# Patient Record
Sex: Male | Born: 1955 | Race: White | Hispanic: No | Marital: Single | Smoking: Former smoker
Health system: Southern US, Community
[De-identification: ages and names within clinical notes are randomized; demographics above are authoritative.]

## PROBLEM LIST (undated history)

## (undated) DIAGNOSIS — J309 Allergic rhinitis, unspecified: Secondary | ICD-10-CM

## (undated) DIAGNOSIS — K648 Other hemorrhoids: Secondary | ICD-10-CM

## (undated) DIAGNOSIS — G4730 Sleep apnea, unspecified: Secondary | ICD-10-CM

## (undated) DIAGNOSIS — L659 Nonscarring hair loss, unspecified: Secondary | ICD-10-CM

## (undated) DIAGNOSIS — F3342 Major depressive disorder, recurrent, in full remission: Secondary | ICD-10-CM

## (undated) DIAGNOSIS — I10 Essential (primary) hypertension: Secondary | ICD-10-CM

## (undated) DIAGNOSIS — E785 Hyperlipidemia, unspecified: Secondary | ICD-10-CM

## (undated) DIAGNOSIS — D126 Benign neoplasm of colon, unspecified: Secondary | ICD-10-CM

## (undated) DIAGNOSIS — IMO0002 Reserved for concepts with insufficient information to code with codable children: Secondary | ICD-10-CM

## (undated) DIAGNOSIS — T7840XA Allergy, unspecified, initial encounter: Secondary | ICD-10-CM

## (undated) DIAGNOSIS — K5190 Ulcerative colitis, unspecified, without complications: Secondary | ICD-10-CM

## (undated) DIAGNOSIS — E291 Testicular hypofunction: Secondary | ICD-10-CM

## (undated) HISTORY — DX: Sleep apnea, unspecified: G47.30

## (undated) HISTORY — DX: Benign neoplasm of colon, unspecified: D12.6

## (undated) HISTORY — PX: NASAL SINUS SURGERY: SHX719

## (undated) HISTORY — DX: Hyperlipidemia, unspecified: E78.5

## (undated) HISTORY — DX: Essential (primary) hypertension: I10

## (undated) HISTORY — DX: Allergy, unspecified, initial encounter: T78.40XA

## (undated) HISTORY — PX: POLYPECTOMY: SHX149

## (undated) HISTORY — PX: COLONOSCOPY: SHX174

## (undated) HISTORY — PX: WISDOM TOOTH EXTRACTION: SHX21

## (undated) HISTORY — DX: Major depressive disorder, recurrent, in full remission: F33.42

## (undated) HISTORY — DX: Other hemorrhoids: K64.8

## (undated) HISTORY — DX: Testicular hypofunction: E29.1

## (undated) HISTORY — DX: Allergic rhinitis, unspecified: J30.9

## (undated) HISTORY — DX: Nonscarring hair loss, unspecified: L65.9

## (undated) HISTORY — DX: Reserved for concepts with insufficient information to code with codable children: IMO0002

## (undated) HISTORY — DX: Ulcerative colitis, unspecified, without complications: K51.90

---

## 1988-03-02 ENCOUNTER — Encounter: Payer: Self-pay | Admitting: Gastroenterology

## 1990-10-22 ENCOUNTER — Encounter: Payer: Self-pay | Admitting: Internal Medicine

## 1993-06-08 ENCOUNTER — Encounter: Payer: Self-pay | Admitting: Gastroenterology

## 1995-09-05 DIAGNOSIS — K5190 Ulcerative colitis, unspecified, without complications: Secondary | ICD-10-CM

## 1995-09-05 HISTORY — DX: Ulcerative colitis, unspecified, without complications: K51.90

## 1996-01-09 ENCOUNTER — Encounter: Payer: Self-pay | Admitting: Gastroenterology

## 2000-06-21 ENCOUNTER — Encounter (INDEPENDENT_AMBULATORY_CARE_PROVIDER_SITE_OTHER): Payer: Self-pay | Admitting: Specialist

## 2000-06-21 ENCOUNTER — Encounter: Payer: Self-pay | Admitting: Gastroenterology

## 2000-06-21 ENCOUNTER — Other Ambulatory Visit: Admission: RE | Admit: 2000-06-21 | Discharge: 2000-06-21 | Payer: Self-pay | Admitting: Gastroenterology

## 2002-06-20 ENCOUNTER — Encounter: Payer: Self-pay | Admitting: Gastroenterology

## 2002-06-20 LAB — HM COLONOSCOPY

## 2004-07-07 ENCOUNTER — Ambulatory Visit: Payer: Self-pay | Admitting: Internal Medicine

## 2004-07-18 ENCOUNTER — Ambulatory Visit: Payer: Self-pay | Admitting: Gastroenterology

## 2004-07-18 LAB — HM COLONOSCOPY

## 2004-08-17 ENCOUNTER — Ambulatory Visit: Payer: Self-pay | Admitting: Internal Medicine

## 2004-09-22 ENCOUNTER — Ambulatory Visit: Payer: Self-pay | Admitting: Internal Medicine

## 2004-09-29 ENCOUNTER — Ambulatory Visit: Payer: Self-pay | Admitting: Internal Medicine

## 2004-11-22 ENCOUNTER — Ambulatory Visit: Payer: Self-pay | Admitting: Internal Medicine

## 2004-11-29 ENCOUNTER — Ambulatory Visit: Payer: Self-pay | Admitting: Internal Medicine

## 2004-12-08 ENCOUNTER — Ambulatory Visit: Payer: Self-pay | Admitting: Internal Medicine

## 2005-03-03 ENCOUNTER — Ambulatory Visit: Payer: Self-pay | Admitting: Internal Medicine

## 2005-04-07 ENCOUNTER — Ambulatory Visit: Payer: Self-pay | Admitting: Internal Medicine

## 2005-06-08 ENCOUNTER — Ambulatory Visit: Payer: Self-pay | Admitting: Internal Medicine

## 2005-08-08 ENCOUNTER — Ambulatory Visit: Payer: Self-pay | Admitting: Internal Medicine

## 2005-08-15 ENCOUNTER — Ambulatory Visit: Payer: Self-pay | Admitting: Internal Medicine

## 2005-09-26 ENCOUNTER — Ambulatory Visit: Payer: Self-pay | Admitting: Gastroenterology

## 2005-10-12 ENCOUNTER — Ambulatory Visit: Payer: Self-pay | Admitting: Internal Medicine

## 2005-10-26 ENCOUNTER — Ambulatory Visit: Payer: Self-pay | Admitting: Internal Medicine

## 2006-02-13 ENCOUNTER — Ambulatory Visit: Payer: Self-pay | Admitting: Internal Medicine

## 2006-08-14 ENCOUNTER — Ambulatory Visit: Payer: Self-pay | Admitting: Internal Medicine

## 2006-08-14 LAB — CONVERTED CEMR LAB
Albumin: 4.3 g/dL (ref 3.5–5.2)
BUN: 12 mg/dL (ref 6–23)
Basophils Relative: 0.4 % (ref 0.0–1.0)
Chloride: 105 meq/L (ref 96–112)
Creatinine, Ser: 0.9 mg/dL (ref 0.4–1.5)
Eosinophil percent: 3.3 % (ref 0.0–5.0)
Glucose, Bld: 102 mg/dL — ABNORMAL HIGH (ref 70–99)
Hemoglobin: 14.6 g/dL (ref 13.0–17.0)
Lymphocytes Relative: 25.1 % (ref 12.0–46.0)
MCV: 93 fL (ref 78.0–100.0)
Monocytes Absolute: 0.6 10*3/uL (ref 0.2–0.7)
Monocytes Relative: 9.1 % (ref 3.0–11.0)
Neutro Abs: 4 10*3/uL (ref 1.4–7.7)
Neutrophils Relative %: 62.1 % (ref 43.0–77.0)
Platelets: 291 10*3/uL (ref 150–400)
Potassium: 4.4 meq/L (ref 3.5–5.1)
RBC: 4.71 M/uL (ref 4.22–5.81)
Total Protein: 7.2 g/dL (ref 6.0–8.3)
Triglyceride fasting, serum: 112 mg/dL (ref 0–149)

## 2006-08-21 ENCOUNTER — Ambulatory Visit: Payer: Self-pay | Admitting: Internal Medicine

## 2006-09-06 ENCOUNTER — Ambulatory Visit: Payer: Self-pay

## 2006-09-18 ENCOUNTER — Ambulatory Visit: Payer: Self-pay | Admitting: Gastroenterology

## 2006-10-02 ENCOUNTER — Encounter (INDEPENDENT_AMBULATORY_CARE_PROVIDER_SITE_OTHER): Payer: Self-pay | Admitting: *Deleted

## 2006-10-02 ENCOUNTER — Ambulatory Visit: Payer: Self-pay | Admitting: Gastroenterology

## 2006-11-02 ENCOUNTER — Ambulatory Visit: Payer: Self-pay | Admitting: Internal Medicine

## 2006-11-02 LAB — CONVERTED CEMR LAB: HIV-1 antibody: UNDETERMINED — AB

## 2006-11-13 ENCOUNTER — Ambulatory Visit: Payer: Self-pay | Admitting: Internal Medicine

## 2007-02-19 ENCOUNTER — Ambulatory Visit: Payer: Self-pay | Admitting: Internal Medicine

## 2007-02-19 LAB — CONVERTED CEMR LAB
BUN: 16 mg/dL (ref 6–23)
Calcium: 9.6 mg/dL (ref 8.4–10.5)
GFR calc Af Amer: 115 mL/min
GFR calc non Af Amer: 95 mL/min
Sodium: 140 meq/L (ref 135–145)

## 2007-02-20 ENCOUNTER — Encounter: Payer: Self-pay | Admitting: Internal Medicine

## 2007-02-20 LAB — CONVERTED CEMR LAB
HIV 1 RNA Quant: <50 copies/mL (ref <50)
HIV-2 Ab: NEGATIVE
HIV: REACTIVE

## 2007-05-28 ENCOUNTER — Ambulatory Visit: Payer: Self-pay | Admitting: Internal Medicine

## 2007-05-28 DIAGNOSIS — K5190 Ulcerative colitis, unspecified, without complications: Secondary | ICD-10-CM

## 2007-05-28 DIAGNOSIS — G4700 Insomnia, unspecified: Secondary | ICD-10-CM

## 2007-05-28 DIAGNOSIS — I10 Essential (primary) hypertension: Secondary | ICD-10-CM

## 2007-05-28 DIAGNOSIS — E785 Hyperlipidemia, unspecified: Secondary | ICD-10-CM

## 2007-05-28 DIAGNOSIS — L659 Nonscarring hair loss, unspecified: Secondary | ICD-10-CM

## 2007-05-28 HISTORY — DX: Essential (primary) hypertension: I10

## 2007-05-28 HISTORY — DX: Ulcerative colitis, unspecified, without complications: K51.90

## 2007-05-28 HISTORY — DX: Insomnia, unspecified: G47.00

## 2007-05-28 HISTORY — DX: Nonscarring hair loss, unspecified: L65.9

## 2007-05-28 HISTORY — DX: Hyperlipidemia, unspecified: E78.5

## 2007-05-28 LAB — CONVERTED CEMR LAB
Cholesterol, target level: 200 mg/dL
LDL Goal: 130 mg/dL

## 2007-09-23 ENCOUNTER — Ambulatory Visit: Payer: Self-pay | Admitting: Internal Medicine

## 2007-12-23 ENCOUNTER — Encounter: Payer: Self-pay | Admitting: Internal Medicine

## 2008-03-24 ENCOUNTER — Ambulatory Visit: Payer: Self-pay | Admitting: Internal Medicine

## 2008-03-24 LAB — CONVERTED CEMR LAB
AST: 22 units/L (ref 0–37)
LDL Cholesterol: 106 mg/dL — ABNORMAL HIGH (ref 0–99)
Total CHOL/HDL Ratio: 4.5
Triglycerides: 169 mg/dL — ABNORMAL HIGH (ref 0–149)
VLDL: 34 mg/dL (ref 0–40)

## 2008-03-31 ENCOUNTER — Ambulatory Visit: Payer: Self-pay | Admitting: Internal Medicine

## 2008-07-17 ENCOUNTER — Ambulatory Visit: Payer: Self-pay | Admitting: Internal Medicine

## 2008-07-17 LAB — CONVERTED CEMR LAB: Testosterone: 217.02 ng/dL — ABNORMAL LOW (ref 350.00–890)

## 2008-07-24 ENCOUNTER — Ambulatory Visit: Payer: Self-pay | Admitting: Internal Medicine

## 2008-07-24 DIAGNOSIS — E291 Testicular hypofunction: Secondary | ICD-10-CM

## 2008-07-24 HISTORY — DX: Testicular hypofunction: E29.1

## 2008-08-03 ENCOUNTER — Telehealth: Payer: Self-pay | Admitting: Gastroenterology

## 2008-08-04 ENCOUNTER — Ambulatory Visit: Payer: Self-pay | Admitting: Gastroenterology

## 2008-08-11 ENCOUNTER — Ambulatory Visit: Payer: Self-pay | Admitting: Internal Medicine

## 2008-08-17 ENCOUNTER — Encounter: Payer: Self-pay | Admitting: Gastroenterology

## 2008-08-17 ENCOUNTER — Ambulatory Visit: Payer: Self-pay | Admitting: Gastroenterology

## 2008-08-19 ENCOUNTER — Encounter: Payer: Self-pay | Admitting: Gastroenterology

## 2008-09-14 ENCOUNTER — Ambulatory Visit: Payer: Self-pay | Admitting: Internal Medicine

## 2008-10-14 ENCOUNTER — Ambulatory Visit: Payer: Self-pay | Admitting: Internal Medicine

## 2008-10-16 ENCOUNTER — Ambulatory Visit: Payer: Self-pay | Admitting: Internal Medicine

## 2008-10-16 LAB — CONVERTED CEMR LAB: Testosterone: 409.19 ng/dL (ref 350.00–890)

## 2008-10-23 ENCOUNTER — Ambulatory Visit: Payer: Self-pay | Admitting: Internal Medicine

## 2008-11-11 ENCOUNTER — Ambulatory Visit: Payer: Self-pay | Admitting: Internal Medicine

## 2008-11-17 ENCOUNTER — Ambulatory Visit: Payer: Self-pay | Admitting: Internal Medicine

## 2008-11-17 ENCOUNTER — Telehealth: Payer: Self-pay | Admitting: *Deleted

## 2008-11-17 LAB — CONVERTED CEMR LAB: Creatinine, Ser: 0.8 mg/dL (ref 0.4–1.5)

## 2008-11-18 ENCOUNTER — Encounter: Admission: RE | Admit: 2008-11-18 | Discharge: 2008-11-18 | Payer: Self-pay | Admitting: Internal Medicine

## 2008-11-25 ENCOUNTER — Encounter: Payer: Self-pay | Admitting: Internal Medicine

## 2008-11-25 ENCOUNTER — Ambulatory Visit: Payer: Self-pay

## 2008-12-14 ENCOUNTER — Ambulatory Visit: Payer: Self-pay | Admitting: Internal Medicine

## 2008-12-18 ENCOUNTER — Telehealth: Payer: Self-pay | Admitting: Internal Medicine

## 2008-12-18 ENCOUNTER — Encounter: Payer: Self-pay | Admitting: Internal Medicine

## 2008-12-18 DIAGNOSIS — G4730 Sleep apnea, unspecified: Secondary | ICD-10-CM | POA: Insufficient documentation

## 2008-12-18 HISTORY — DX: Sleep apnea, unspecified: G47.30

## 2009-01-18 ENCOUNTER — Ambulatory Visit: Payer: Self-pay | Admitting: Internal Medicine

## 2009-01-29 ENCOUNTER — Ambulatory Visit (HOSPITAL_BASED_OUTPATIENT_CLINIC_OR_DEPARTMENT_OTHER): Admission: RE | Admit: 2009-01-29 | Discharge: 2009-01-29 | Payer: Self-pay | Admitting: Neurosurgery

## 2009-01-29 ENCOUNTER — Encounter: Payer: Self-pay | Admitting: Internal Medicine

## 2009-02-09 ENCOUNTER — Ambulatory Visit: Payer: Self-pay | Admitting: Pulmonary Disease

## 2009-02-19 ENCOUNTER — Ambulatory Visit: Payer: Self-pay | Admitting: Internal Medicine

## 2009-03-26 ENCOUNTER — Ambulatory Visit: Payer: Self-pay | Admitting: Internal Medicine

## 2009-05-05 ENCOUNTER — Ambulatory Visit: Payer: Self-pay | Admitting: Internal Medicine

## 2009-05-07 ENCOUNTER — Ambulatory Visit: Payer: Self-pay | Admitting: Internal Medicine

## 2009-05-07 LAB — CONVERTED CEMR LAB
ALT: 27 units/L (ref 0–53)
AST: 27 units/L (ref 0–37)
Basophils Relative: 0.8 % (ref 0.0–3.0)
Bilirubin Urine: NEGATIVE
Chloride: 106 meq/L (ref 96–112)
Cholesterol: 167 mg/dL (ref 0–200)
Eosinophils Relative: 2.9 % (ref 0.0–5.0)
GFR calc non Af Amer: 108.17 mL/min (ref >60)
HCT: 44 % (ref 39.0–52.0)
Hemoglobin: 15.3 g/dL (ref 13.0–17.0)
LDL Cholesterol: 98 mg/dL (ref 0–99)
Lymphs Abs: 1.4 10*3/uL (ref 0.7–4.0)
MCV: 91.4 fL (ref 78.0–100.0)
Monocytes Absolute: 0.6 10*3/uL (ref 0.1–1.0)
Monocytes Relative: 9.5 % (ref 3.0–12.0)
Neutro Abs: 4.2 10*3/uL (ref 1.4–7.7)
Platelets: 220 10*3/uL (ref 150.0–400.0)
Potassium: 4.1 meq/L (ref 3.5–5.1)
Protein, U semiquant: NEGATIVE
RBC: 4.81 M/uL (ref 4.22–5.81)
Sodium: 140 meq/L (ref 135–145)
TSH: 1.14 microintl units/mL (ref 0.35–5.50)
Total Bilirubin: 0.8 mg/dL (ref 0.3–1.2)
Total CHOL/HDL Ratio: 4
Total Protein: 7.1 g/dL (ref 6.0–8.3)
Urobilinogen, UA: 0.2
VLDL: 24.8 mg/dL (ref 0.0–40.0)
WBC Urine, dipstick: NEGATIVE
WBC: 6.5 10*3/uL (ref 4.5–10.5)

## 2009-05-21 ENCOUNTER — Ambulatory Visit: Payer: Self-pay | Admitting: Internal Medicine

## 2009-05-21 DIAGNOSIS — J309 Allergic rhinitis, unspecified: Secondary | ICD-10-CM | POA: Insufficient documentation

## 2009-05-21 HISTORY — DX: Allergic rhinitis, unspecified: J30.9

## 2009-06-10 ENCOUNTER — Ambulatory Visit: Payer: Self-pay | Admitting: Internal Medicine

## 2009-07-21 ENCOUNTER — Ambulatory Visit: Payer: Self-pay | Admitting: Internal Medicine

## 2009-08-09 ENCOUNTER — Ambulatory Visit: Payer: Self-pay | Admitting: Internal Medicine

## 2009-09-10 ENCOUNTER — Ambulatory Visit: Payer: Self-pay | Admitting: Internal Medicine

## 2009-10-13 ENCOUNTER — Ambulatory Visit: Payer: Self-pay | Admitting: Internal Medicine

## 2009-11-11 ENCOUNTER — Ambulatory Visit: Payer: Self-pay | Admitting: Internal Medicine

## 2009-11-11 LAB — CONVERTED CEMR LAB
ALT: 25 units/L (ref 0–53)
Alkaline Phosphatase: 48 units/L (ref 39–117)
Bilirubin, Direct: 0 mg/dL (ref 0.0–0.3)
Direct LDL: 142.2 mg/dL
HDL: 47.7 mg/dL (ref >39.00)
Total Bilirubin: 0.6 mg/dL (ref 0.3–1.2)

## 2009-11-18 ENCOUNTER — Ambulatory Visit: Payer: Self-pay | Admitting: Internal Medicine

## 2009-12-07 ENCOUNTER — Telehealth: Payer: Self-pay | Admitting: Gastroenterology

## 2010-01-11 DIAGNOSIS — K648 Other hemorrhoids: Secondary | ICD-10-CM

## 2010-01-11 HISTORY — DX: Other hemorrhoids: K64.8

## 2010-01-12 ENCOUNTER — Ambulatory Visit: Payer: Self-pay | Admitting: Gastroenterology

## 2010-01-26 ENCOUNTER — Ambulatory Visit: Payer: Self-pay | Admitting: Internal Medicine

## 2010-02-10 ENCOUNTER — Ambulatory Visit: Payer: Self-pay | Admitting: Internal Medicine

## 2010-02-14 ENCOUNTER — Telehealth: Payer: Self-pay | Admitting: Internal Medicine

## 2010-03-01 ENCOUNTER — Ambulatory Visit: Payer: Self-pay | Admitting: Internal Medicine

## 2010-03-09 ENCOUNTER — Telehealth: Payer: Self-pay | Admitting: Internal Medicine

## 2010-03-18 ENCOUNTER — Ambulatory Visit: Payer: Self-pay | Admitting: Internal Medicine

## 2010-03-18 DIAGNOSIS — IMO0002 Reserved for concepts with insufficient information to code with codable children: Secondary | ICD-10-CM

## 2010-03-18 HISTORY — DX: Reserved for concepts with insufficient information to code with codable children: IMO0002

## 2010-03-23 ENCOUNTER — Ambulatory Visit: Payer: Self-pay | Admitting: Internal Medicine

## 2010-03-23 LAB — CONVERTED CEMR LAB
ALT: 24 units/L (ref 0–53)
Cholesterol: 155 mg/dL (ref 0–200)
HDL: 38 mg/dL — ABNORMAL LOW (ref >39.00)
LDL Cholesterol: 86 mg/dL (ref 0–99)
Testosterone: 258.7 ng/dL — ABNORMAL LOW (ref 350.00–890.00)
Total Bilirubin: 0.5 mg/dL (ref 0.3–1.2)
Total Protein: 6.7 g/dL (ref 6.0–8.3)
VLDL: 30.8 mg/dL (ref 0.0–40.0)

## 2010-03-30 ENCOUNTER — Ambulatory Visit: Payer: Self-pay | Admitting: Internal Medicine

## 2010-04-20 ENCOUNTER — Ambulatory Visit: Payer: Self-pay | Admitting: Internal Medicine

## 2010-05-18 ENCOUNTER — Ambulatory Visit: Payer: Self-pay | Admitting: Internal Medicine

## 2010-06-08 ENCOUNTER — Ambulatory Visit: Payer: Self-pay | Admitting: Internal Medicine

## 2010-07-01 ENCOUNTER — Ambulatory Visit: Payer: Self-pay | Admitting: Internal Medicine

## 2010-07-20 ENCOUNTER — Ambulatory Visit: Payer: Self-pay | Admitting: Internal Medicine

## 2010-08-15 ENCOUNTER — Encounter: Payer: Self-pay | Admitting: Gastroenterology

## 2010-08-25 ENCOUNTER — Ambulatory Visit: Payer: Self-pay | Admitting: Internal Medicine

## 2010-09-14 ENCOUNTER — Ambulatory Visit
Admission: RE | Admit: 2010-09-14 | Discharge: 2010-09-14 | Payer: Self-pay | Source: Home / Self Care | Attending: Internal Medicine | Admitting: Internal Medicine

## 2010-09-25 ENCOUNTER — Encounter: Payer: Self-pay | Admitting: Internal Medicine

## 2010-10-06 NOTE — Assessment & Plan Note (Signed)
Summary: testosterone inj/cjr  Nurse Visit   Allergies: No Known Drug Allergies  Medication Administration  Injection # 1:    Medication: Testosterone Cypionat 260m ing    Diagnosis: HYPOGONADISM, MALE (ICD-257.2)    Route: IM    Site: LUOQ gluteus    Exp Date: 02/02/2011    Lot #: [id number]   Mfr: [email or web address]    Given by: BAllyne Gee LPN (September 14, 2722512:19 PM)  Orders Added: 1)  Admin of patients own med IM/SQ [96372M] 2)  Admin 1st Vaccine [90471] 3)  Flu Vaccine 332yr+ [9[75051]Review of Systems       Flu Vaccine Consent Questions     Do you have a history of severe allergic reactions to this vaccine? no    Any prior history of allergic reactions to egg and/or gelatin? no    Do you have a sensitivity to the preservative Thimersol? no    Do you have a past history of Guillan-Barre Syndrome? no    Do you currently have an acute febrile illness? no    Have you ever had a severe reaction to latex? no    Vaccine information given and explained to patient? yes    Are you currently pregnant? no    Lot Number:[id number]   Exp Date:03/04/2011   Site Given  Left Deltoid IM

## 2010-10-06 NOTE — Assessment & Plan Note (Signed)
Summary: 4 month rov/njr   Vital Signs:  Patient profile:   55 year old male Height:      73 inches Weight:      301 pounds BMI:     39.86 Temp:     98.2 degrees F oral Pulse rate:   96 / minute Resp:     14 per minute BP sitting:   136 / 82  (left arm) Cuff size:   large  Vitals Entered By: Allyne Gee, LPN (March 30, 1609 9:60 AM) CC: roa labs, Lipid Management Is Patient Diabetic? No   Primary Care Provider:  Benay Pillow, MD   CC:  roa labs and Lipid Management.  History of Present Illness: follow up for blood work asthma is fine back pain  is still present if he takes the IBU it is controllable but if he misses a does he can feel the pain radiating into the foot and ankle the plain films were not diagnostic ( murphy wainer)  Lipid Management History:      Positive NCEP/ATP III risk factors include male age 47 years old or older, HDL cholesterol less than 29, family history for ischemic heart disease (males less than 28 years old), and hypertension.  Negative NCEP/ATP III risk factors include non-diabetic, non-tobacco-user status, no ASHD (atherosclerotic heart disease), no prior stroke/TIA, no peripheral vascular disease, and no history of aortic aneurysm.     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 At Bedtime 2)  Sulfazine Ec 500 Mg Tbec (Sulfasalazine) .... 3 Tablets By Mouth Two Times A Day 3)  Vytorin 10-20 Mg Tabs (Ezetimibe-Simvastatin) .... At Bedtime 4)  Azor 10-40 Mg  Tabs (Amlodipine-Olmesartan) .... Once Daily 5)  Multivitamin/iron   Tabs (Multiple Vitamins-Iron) .... Once Daily 6)  Testosterone Cypionate 200 Mg/ml Oil (Testosterone Cypionate) .... 1/2 Ml Every 2 Weeks 7)  Zolpidem Tartrate 10 Mg Tabs (Zolpidem Tartrate) .Marland Kitchen.. 1 At Bedtime As Needed 8)  Nasonex 50 Mcg/act Susp (Mometasone Furoate) .... Two Sprays Q Nare Daily 9)  Ibuprofen 800 Mg Tab (Ibuprofen) .... Take One (1) Tablet By Mouth Three Times A Day With Food 10)  Cyclobenzaprine Hcl 10 Mg Tabs (Cyclobenzaprine Hcl) .Marland Kitchen.. 1- 1/2 By Mouth Three Times A Day As Needed  Allergies (verified): No Known Drug Allergies  Past History:  Family History: Last updated: 01/12/2010 Family History Hypertension Fam hx Obesity Family History High cholesterol Family History Breast cancer 1st degree relative <50 Family History of CAD Male 1st degree relative <50 Family History of Colitis/Crohn's: 2 cousins  with ulcerative colitis on paternal side. No FH of Colon Cancer:  Social History: Last updated: 01/12/2010 Occupation:realtor Single Patient is a former smoker. Drug use-no Alcohol Use - yes: 1 per day  Daily Caffeine Use: 1 per day   Risk Factors: Smoking Status: quit (03/30/2010) Packs/Day: 1/2 (03/30/2010)  Past medical, surgical, family and social histories (including risk factors) reviewed, and no changes noted (except as noted below).  Past Medical History: Reviewed history from 01/12/2010 and no changes  required. Hypertension Colonic polyps, hx of hyperplastic Chronic universal ulcerative colitis, since age 30 Hyperlipidemia hair loss Sleep Apnea  Past Surgical History: Reviewed history from 01/12/2010 and no changes required. Sinus surgery  Family History: Reviewed history from 01/12/2010 and no changes required. Family History Hypertension Fam hx Obesity Family History High cholesterol Family History Breast cancer 1st degree relative <50 Family History of CAD Male 1st degree relative <50 Family History of Colitis/Crohn's: 2 cousins with ulcerative colitis on paternal side. No FH of Colon Cancer:  Social History: Reviewed history from 01/12/2010 and no changes required. Occupation:realtor Single Patient is a former smoker. Drug use-no Alcohol Use - yes: 1 per day  Daily Caffeine Use: 1 per day   Review of Systems  The patient denies anorexia, fever, weight loss, weight gain, vision loss, decreased hearing, hoarseness, chest pain, syncope, dyspnea on exertion, peripheral edema, prolonged cough, headaches, hemoptysis, abdominal pain, melena, hematochezia, severe indigestion/heartburn, hematuria, incontinence, genital sores, muscle weakness, suspicious skin lesions, transient blindness, difficulty walking, depression, unusual weight change, abnormal bleeding, enlarged lymph nodes, angioedema, breast masses, and testicular masses.         back pain  Physical Exam  General:  Well-developed,well-nourished,in no acute distress; alert,appropriate and cooperative throughout examination Head:  normocephalic and focal alopecia.  male-pattern balding.   Eyes:  vision grossly intact, pupils equal, pupils round, pupils reactive to light, and nystagmus.   Ears:  R ear normal and L ear normal.   Nose:  no external deformity and no nasal discharge.   Mouth:  Oral mucosa and oropharynx without lesions or exudates.  Teeth in good repair. Neck:  No deformities, masses, or tenderness  noted. Lungs:  normal respiratory effort and no wheezes.   Heart:  Normal rate and regular rhythm. S1 and S2 normal without gallop, murmur, click, rub or other extra sounds. Abdomen:  Bowel sounds positive,abdomen soft and non-tender without masses, organomegaly or hernias noted. Msk:  lumbar lordosis and SI joint tenderness.  left lift positive on left Pulses:  R and L carotid,radial,femoral,dorsalis pedis and posterior tibial pulses are full and equal bilaterally Extremities:  No clubbing, cyanosis, edema, or deformity noted with normal full range of motion of all joints.     Impression & Recommendations:  Problem # 1:  BACK PAIN, LUMBAR, WITH RADICULOPATHY (ICD-724.4) Assessment Unchanged  the pain has been present for 2 months he has seen an orthopedist who did plain films that were non diagnostis and failed  two courses o f therapy including three PT visits he has tried accupuncture and now the pain has moved from stopping at the knee to extend to the mid calf n the left side The following medications were removed from the medication list:    Flexeril 10 Mg Tabs (Cyclobenzaprine hcl) ..... One tid His updated medication list for this problem includes:    Ibuprofen 800 Mg Tab (Ibuprofen) .Marland Kitchen... Take one (1) tablet by mouth three times a day with food    Cyclobenzaprine Hcl 10 Mg Tabs (Cyclobenzaprine  hcl) ..... 1- 1/2 by mouth three times a day as needed  Orders: Radiology Referral (Radiology)  Discussed use of moist heat or ice, modified activities, medications, and stretching/strengthening exercises. Back care instructions given. To be seen in 2 weeks if no improvement; sooner if worsening of symptoms.   Problem # 2:  HYPERTENSION (ICD-401.9) Assessment: Unchanged  His updated medication list for this problem includes:    Azor 10-40 Mg Tabs (Amlodipine-olmesartan) ..... Once daily  BP today: 136/82 Prior BP: 142/84 (03/18/2010)  10 Yr Risk Heart Disease: 6 % Prior 10 Yr  Risk Heart Disease: 11 % (08/09/2009)  Labs Reviewed: K+: 4.1 (05/07/2009) Creat: : 0.8 (05/07/2009)   Chol: 155 (03/23/2010)   HDL: 38.00 (03/23/2010)   LDL: 86 (03/23/2010)   TG: 154.0 (03/23/2010)  Problem # 3:  HYPERLIPIDEMIA (ICD-272.4)  His updated medication list for this problem includes:    Vytorin 10-20 Mg Tabs (Ezetimibe-simvastatin) .Marland Kitchen... At bedtime  Labs Reviewed: SGOT: 23 (03/23/2010)   SGPT: 24 (03/23/2010)  Lipid Goals: Chol Goal: 200 (05/28/2007)   HDL Goal: 40 (05/28/2007)   LDL Goal: 130 (05/28/2007)   TG Goal: 150 (05/28/2007)  10 Yr Risk Heart Disease: 6 % Prior 10 Yr Risk Heart Disease: 11 % (08/09/2009)   HDL:38.00 (03/23/2010), 47.70 (11/11/2009)  LDL:86 (03/23/2010), 98 (05/07/2009)  Chol:155 (03/23/2010), 210 (11/11/2009)  Trig:154.0 (03/23/2010), 220.0 (11/11/2009)  Complete Medication List: 1)  Propecia 1 Mg Tabs (Finasteride) .... At bedtime 2)  Sulfazine Ec 500 Mg Tbec (Sulfasalazine) .... 3 tablets by mouth two times a day 3)  Vytorin 10-20 Mg Tabs (Ezetimibe-simvastatin) .... At bedtime 4)  Azor 10-40 Mg Tabs (Amlodipine-olmesartan) .... Once daily 5)  Multivitamin/iron Tabs (Multiple vitamins-iron) .... Once daily 6)  Testosterone Cypionate 200 Mg/ml Oil (Testosterone cypionate) .... 1/2 ml every 2 weeks 7)  Zolpidem Tartrate 10 Mg Tabs (Zolpidem tartrate) .Marland Kitchen.. 1 at bedtime as needed 8)  Nasonex 50 Mcg/act Susp (Mometasone furoate) .... Two sprays q nare daily 9)  Ibuprofen 800 Mg Tab (Ibuprofen) .... Take one (1) tablet by mouth three times a day with food 10)  Cyclobenzaprine Hcl 10 Mg Tabs (Cyclobenzaprine hcl) .Marland Kitchen.. 1- 1/2 by mouth three times a day as needed  Other Orders: Admin of patients own med IM/SQ [phone])  Lipid Assessment/Plan:      Based on NCEP/ATP III, the patient's risk factor category is "2 or more risk factors and a calculated 10 year CAD risk of < 20%".  The patient's lipid goals are as follows: Total cholesterol goal is 200;  LDL cholesterol goal is 130; HDL cholesterol goal is 40; Triglyceride goal is 150.  His LDL cholesterol goal has been met.    Patient Instructions: 1)  Please schedule a follow-up appointment in 6 months.cpx   Medication Administration  Injection # 1:    Medication: Testosterone Cypionat 261m ing    Diagnosis: HYPOGONADISM, MALE (ICD-257.2)    Route: IM    Site: RUOQ gluteus    Exp Date: 02/02/2011    Lot #: [id number]   Mfr: sandoz    Comments: pt brought own medication    Patient tolerated injection without complications    Given by: BAllyne Gee LPN (July 27, 2229799:89AM)  Orders Added: 1)  Admin of patients own med IM/SQ [96372M] 2)  Radiology Referral [Radiology] 3)  Est. Patient Level III [[21194]

## 2010-10-06 NOTE — Assessment & Plan Note (Signed)
Summary: testosterone inj/cjr  Nurse Visit   Allergies: No Known Drug Allergies  Medication Administration  Injection # 1:    Medication: Testosterone Cypionat 231m ing    Diagnosis: HYPOGONADISM, MALE (ICD-257.2)    Route: IM    Site: LUOQ gluteus    Exp Date: 02/02/2011    Lot #: [id number]   Mfr: sandoz    Comments: brought own medication    Patient tolerated injection without complications    Given by: BAllyne Gee LPN (May 25, 22712192:90PM)  Orders Added: 1)  Admin of patients own med IM/SQ [[id number]

## 2010-10-06 NOTE — Assessment & Plan Note (Signed)
Summary: Gastroenterology  Richard [name] MR#:  [id number] Page #  NAME:  Richard [name], Richard [name]  OFFICE NO:  [id number]  DATE:  09/26/05  DOB:  [birth year]  HISTORY OF PRESENT ILLNESS:  The patient returns for a followup of chronic universal ulcerative colitis. He states he is doing quite well on Azulfidine 1.5b.i.d. He occasionally has some minimally loose stools for a day or two and occasionally sees a small amount of bleeding on the tissue paper. These symptoms may happen every month or two. His appetite is good, and his weight is stable. His last colonoscopy was performed in November 2005, and he is scheduled for recall colonoscopy for November 2007. Small internal hemorrhoids were noted on that examination.  CURRENT MEDICATIONS:  Listed on the chart, updated, and reviewed.  MEDICATION ALLERGIES:  None known.  EXAM:  In no acute distress. Weight not obtained. Blood pressure is 140/86, pulse 78 and regular. Chest:  Clear to auscultation bilaterally. Cardiac:  Regular rate and rhythm without murmurs appreciated. Abdomen is soft, nontender, nondistended; normoactive bowel sounds; no palpable organomegaly, masses, or hernias.  ASSESSMENT AND PLAN:  Chronic universal ulcerative colitis. Continue Azulfidine 1.5b.i.d. Recall colonoscopy due in November 2007.     [name]. Fuller Plan, M.D., F.A.C.G.  [id number] D:  09/26/05; T:  ; Job [phone]

## 2010-10-06 NOTE — Procedures (Signed)
Summary: Colonoscopy/[facility]  Colonoscopy/[facility]   Imported By: Phillis Knack 01/12/2010 14:54:32  _____________________________________________________________________  External Attachment:    Type:   Image     Comment:   External Document

## 2010-10-06 NOTE — Procedures (Signed)
Summary: Colonoscopy and biopsy   Colonoscopy  Procedure date:  10/02/2006  Findings:      Results: Polyp.  Results: Colitis.       Location:  [facility].    Procedures Next Due Date:    Colonoscopy: 10/2008 Patient Name: Richard [name], Richard [name] MRN: [id number] Procedure Procedures: Colonoscopy CPT: 10315.    with biopsy. CPT: X8550940.    with polypectomy. CPT: X5071110.  Personnel: Endoscopist: [name]. Fuller Plan, MD, [name].  Exam Location: Exam performed in Outpatient Clinic. Outpatient  Patient Consent: Procedure, Alternatives, Risks and Benefits discussed, consent obtained, from patient. Consent was obtained by the RN.  Indications  Surveillance of: Ulcerative Colitis.  History  Current Medications: Patient is not currently taking Coumadin.  Pre-Exam Physical: Performed Oct 02, 2006. Cardio-pulmonary exam, Rectal exam, HEENT exam , Abdominal exam, Mental status exam WNL.  Comments: Pt. history reviewed/updated, physical exam performed prior to initiation of sedation?Yes Exam Exam: Extent of exam reached: Cecum, extent intended: Cecum.  The cecum was identified by appendiceal orifice and IC valve. Time to Cecum: 00:08: 01. Time for Withdrawl: 00:15:44. Colon retroflexion performed. ASA Classification: II. Tolerance: excellent.  Monitoring: Pulse and BP monitoring, Oximetry used. Supplemental O2 given.  Colon Prep Used MoviPrep for colon prep. Prep results: good.  Sedation Meds: Patient assessed and found to be appropriate for moderate (conscious) sedation. Fentanyl 100 mcg. given IV. Versed 10 mg. given IV.  Findings POLYP: Transverse Colon, Maximum size: 6 mm. sessile polyp. Procedure:  snare with cautery, removed, retrieved, Polyp sent to pathology. ICD9: Colon Polyps: 211.3.  - MUCOSAL ABNORMALITY: Ascending Colon to Rectum. Erythema present. Granularity present, Activity level mild, Biopsy/Mucosal Abn. taken. ICD9: Colitis, Ulcerative: 556.9. Comments:  patchy involvement, biopsies taken every 10 cm from cecum to rectum.  MULTIPLE POLYPS: Cecum. minimum size 2 mm, maximum size 4 mm. Procedure:  snare without cautery, removed, Polyp retrieved, 2 polyps Polyps sent to pathology. ICD9: Colon Polyps: 211.3.   Assessment  Diagnoses: 211.3: Colon Polyps.  556.9: Colitis, Ulcerative.   Events  Unplanned Interventions: No intervention was required.  Unplanned Events: There were no complications. Plans  Post Exam Instructions: Post sedation instructions given.  Medication Plan: Await pathology.  Patient Education: Patient given standard instructions for: Polyps. Colitis.  Disposition: After procedure patient sent to recovery. After recovery patient sent home.  Scheduling/Referral: Colonoscopy, to [facility] T. Fuller Plan, MD, [facility], around Oct 02, 2008.    This report was created from the original endoscopy report, which was reviewed and signed by the above listed endoscopist.    cc: Ricard Dillon, MD    [facility] Surgical Pathology - STATUS: Prelim             By: Hillery Hunter  ,        Perform Date: 29Jan08 00:01  Ordered By: Gertha Calkin Date: 30Jan08 13:30  Facility: [facility]                               Department: [facility]   [address], [place] [address]   Telephone [phone] or [phone] Fax [phone]    REPORT OF SURGICAL PATHOLOGY    Case #: [id number]   Patient Name: Richard [name].   Office Chart Number: N/A    MRN: [id number]  Pathologist: Neldon Mc, MD   DOB/Age September 18, 1955 (Age: 55) Gender: M   Date Taken: 10/02/2006   Date Received: 10/03/2006    FINAL DIAGNOSIS    ***MICROSCOPIC EXAMINATION AND DIAGNOSIS***    1. COLON, BIOPSY: NO ACTIVE COLITIS OR DYSPLASIA IDENTIFIED   (BIOPSIES, 80 CM).    2. COLON, POLYP(S): TWO HYPERPLASTIC POLYP(S). NO ADENOMATOUS   CHANGE OR MALIGNANCY IDENTIFIED (BIOPSIES,  CECUM).    3. COLON: MILD ACTIVE MUCOSAL COLITIS (BIOPSIES, 70 CM).    4. COLON: CHRONIC MINIMALLY ACTIVE COLITIS. NO DYSPLASIA   IDENTIFIED (BIOPSIES, 60 CM).    5. COLON: BENIGN COLONIC MUCOSA WITH SUBTLE HYPERPLASTIC   CHANGES AND MUCOSAL HYPEREMIA (BIOPSIES, TRANSVERSE).    6. COLON: CHRONIC CHANGES. HYPERPLASTIC POLYP. NO ACTIVE   COLITIS OR DYSPLASIA IDENTIFIED (BIOPSIES, 50 CM).    7. COLON: CHRONIC MINIMALLY ACTIVE COLITIS. NO DYSPLASIA   IDENTIFIED (BIOPSIES, 40 CM).    8. COLON: MILD CHRONIC MUCOSAL COLITIS (BIOPSY, 30 CM).    9. COLON, BIOPSY: NO ACTIVE COLITIS OR DYSPLASIA IDENTIFIED   (BIOPSIES, 20 CM).    10. COLON, BIOPSY: NO ACTIVE COLITIS OR DYSPLASIA IDENTIFIED   (BIOPSIES, 10 CM).    COMMENT   1, 9   architecture and no objective increase in inflammation. No   active inflammation, microscopic colitis, collagenous colitis or   significant chronic changes identified. No hyperplastic or   adenomatous changes are seen, and there is no evidence of   malignancy.    2. There are benign colorectal type glands that have a serrated   architecture consistent with a hyperplastic polyp(s). No   adenomatous changes or evidence of malignancy is identified.   These features are present in both biopsies.    3, 4, 7   present ranging from minimal to mild. The findings are   consistent with inflammatory bowel disease.    5. There are some subtle hyperplastic changes present that I   suspect may represent early hyperplastic polyp formation. No   adenomatous change or evidence of malignancy is identified.    6. There are chronic changes present consistent with inactive   colitis. There is also a hyperplastic polyp noted. (JAS:mw   10/04/06)    mw   Date Reported: 10/05/2006 Neldon Mc, MD   *** Electronically Signed Out By JAS ***    Clinical information   1, 3, 4, 6. Surveillance ulcerative colitis   2. Cecal polyps, R/O adenomatous changes   5. Transverse polyp  (caf)    specimen(s) obtained   1: Colon, biopsy, 80 cm   2: Colon, polyp(s), cecum   3: Colon, biopsy, 70 cm   4: Colon, biopsy, 60 cm   5: Colon, polyp(s), transverse   6: Colon, biopsy, 50 cm  7: Colon, biopsy, 40 cm   8: Colon, biopsy, 30 cm   9: Colon, biopsy, 20 cm   10: Colon, biopsy, 10 cm    Gross Description   1. Received in formalin are tan, soft tissue fragments that are   submitted in toto. Number: two.   Size: 0.1 and 0.5 cm, one block.    2. Received in formalin are two soft, glistening, light tan   mucosal fragments, 0.3 and 0.9 cm in length. The larger fragment   is sectioned and submitted separately. All tissue is submitted   in two blocks.   A= one polyp trisected.   B= remaining polyp.    3. Received in formalin are tan, soft tissue  fragments that are   submitted in toto. Number: four.   Size: 0.1 to 0.4 cm, one block.    4. Received in formalin are tan, soft tissue fragments that are   submitted in toto. Number: six   Size: 0.1 to 0.3 cm, one block.    5. Received in formalin is a single glistening, tan mucosal   fragment, 0.5 x 0.5 x 0.1 cm. Bisected and entirely submitted in   one block.    6. Received in formalin are tan, soft tissue fragments that are   submitted in toto. Number: four.   Size: 0.2 to 0.3 cm, one block.    7. Received in formalin are tan, soft tissue fragments that are   submitted in toto. Number: four.   Size: 0.2 to 0.5 cm, one block.    8. Received in formalin are tan, soft tissue fragments that are   submitted in toto. Number: four.   Size: 0.2 to 0.6 cm, one block.    9. Received in formalin are tan, soft tissue fragments that are   submitted in toto. Number: two   Size: both 0.2 cm, one block.    10. Received in formalin are tan, soft tissue fragments that are   submitted in toto. Number: four.   Size: 0.3 to 0.5 cm, one block. (TB:gt, 10/03/06)    gdt/

## 2010-10-06 NOTE — Assessment & Plan Note (Signed)
Summary: TESTOSTERONE INJ // RS  Nurse Visit   Allergies: No Known Drug Allergies  Medication Administration  Injection # 1:    Medication: Testosterone Cypionat 266m ing    Diagnosis: HYPOGONADISM, MALE (ICD-257.2)    Route: IM    Site: L thigh    Exp Date: 02/02/2011    Lot #: [id number]   Mfr: sandoz    Comments: .525mgiven    Patient tolerated injection without complications    Given by: BoAllyne GeeLPN (October  5, 2088282:32 PM)  Orders Added: 1)  Admin of patients own med IM/SQ [[phone]

## 2010-10-06 NOTE — Assessment & Plan Note (Signed)
Summary: TESTOSTERONE INJ // RS  Nurse Visit   Vitals Entered By: Allyne Gee, LPN (September 10, 8626 12:27 PM)  Allergies: No Known Drug Allergies  Medication Administration  Injection # 1:    Medication: Testosterone Cypionat 232m ing    Diagnosis: HYPOGONADISM, MALE (ICD-257.2)    Route: IM    Site: RUOQ gluteus    Exp Date: 04/02/2010    Lot #: [id number]   Mfr: [email or web address]    Patient tolerated injection without complications    Given by: BAllyne Gee LPN (January  7, 2010412:28 PM)  Orders Added: 1)  Admin of patients own med IM/SQ [phone]

## 2010-10-06 NOTE — Procedures (Signed)
Summary: Colonoscopy/[facility]  Colonoscopy/[facility]   Imported By: Phillis Knack 01/12/2010 14:50:16  _____________________________________________________________________  External Attachment:    Type:   Image     Comment:   External Document

## 2010-10-06 NOTE — Assessment & Plan Note (Signed)
Summary: TESTOSTERONE INJ // RS  Nurse Visit   Allergies: No Known Drug Allergies

## 2010-10-06 NOTE — Assessment & Plan Note (Signed)
Summary: testosterone inj/cjr  Nurse Visit   Allergies: No Known Drug Allergies  Medication Administration  Injection # 1:    Medication: Testosterone Cypionat 211m ing    Diagnosis: HYPOGONADISM, MALE (ICD-257.2)    Site: RUOQ gluteus    Exp Date: 01/15/2011    Lot #: [id number]   Mfr: sandoz    Comments: .577mgiven    Patient tolerated injection without complications    Given by: BoAllyne GeeLPN (January 11, 2047182:58 PM)  Orders Added: 1)  Admin of patients own med IM/SQ [[phone]

## 2010-10-06 NOTE — Assessment & Plan Note (Signed)
Summary: testosterone inj//ccm  Nurse Visit   Allergies: No Known Drug Allergies  Medication Administration  Injection # 1:    Medication: Testosterone Cypionat 221m ing    Diagnosis: HYPOGONADISM, MALE (ICD-257.2)    Route: IM    Site: LUOQ gluteus    Exp Date: 02/02/2011    Lot #: [id number]   Mfr: sandoz    Comments: 154m200mg--1/2 ml given    Patient tolerated injection without complications    Given by: BoAllyne GeeLPN (August 17, 2031672:14 PM)  Orders Added: 1)  Admin of patients own med IM/SQ [phone]

## 2010-10-06 NOTE — Assessment & Plan Note (Signed)
Summary: testosterone inj/cjr  Nurse Visit   Allergies: No Known Drug Allergies  Medication Administration  Injection # 1:    Medication: Testosterone Cypionat 231m ing    Diagnosis: HYPOGONADISM, MALE (ICD-257.2)    Route: IM    Site: RUOQ gluteus    Exp Date: 04/02/2010    Lot #: [id number]   Mfr: paddock    Patient tolerated injection without complications    Given by: BAllyne Gee LPN (February  9, 225853:38 PM)  Orders Added: 1)  Admin of patients own med IM/SQ [phone]

## 2010-10-06 NOTE — Letter (Signed)
Summary: Colonoscopy Letter  [facility]  [address]   [place], [place] [address]   Phone: [phone]  Fax: [phone]      August 15, 2010 MRN: [id number]   DEVANTE CAPANO [address] [place], [place]  [address]   Dear Mr. Bunn,   According to your medical record, it is time for you to schedule a Colonoscopy. The American Cancer Society recommends this procedure as a method to detect early colon cancer. Patients with a family history of colon cancer, or a personal history of colon polyps or inflammatory bowel disease are at increased risk.  This letter has been generated based on the recommendations made at the time of your procedure. If you feel that in your particular situation this may no longer apply, please contact our office.  Please call our office at [phone] to schedule this appointment or to update your records at your earliest convenience.  Thank you for cooperating with [place] to provide you with the very best care possible.   Sincerely,  Lucio Edward, M.D.  [facility] [phone]

## 2010-10-06 NOTE — Assessment & Plan Note (Signed)
Summary: Colitis f/u--ch.   History of Present Illness Visit Type: new patient  Primary GI MD: Joylene Igo MD [facility] Primary Provider: Benay Pillow, MD  Requesting Provider: n/a Chief Complaint: F/u for ulcerative colitis. Pt denies any GI complaints History of Present Illness:   This is a 55 year old male with ulcerative colitis. He has received his regular followup visits for UC with Dr. Benay Pillow over the past few years. I  have not seen him in the office since 2007. He last underwent colonoscopy in January 2008 and December 2009. He has no gastrointestinal complaints. A basic metabolic panel and CBC in September 2010 were unremarkable.    GI Review of Systems      Denies abdominal pain, belching, bloating, chest pain, dysphagia with liquids, dysphagia with solids, heartburn, loss of appetite, nausea, vomiting, vomiting blood, weight loss, and  weight gain.        Denies anal fissure, black tarry stools, change in bowel habit, constipation, diarrhea, diverticulosis, fecal incontinence, heme positive stool, hemorrhoids, irritable bowel syndrome, jaundice, light color stool, liver problems, rectal bleeding, and  rectal pain.   Current Medications (verified): 1)  Propecia 1 Mg Tabs (Finasteride) .... At Bedtime 2)  Sulfazine Ec 500 Mg Tbec (Sulfasalazine) .... 3 Tablets By Mouth Two Times A Day 3)  Vytorin 10-20 Mg Tabs (Ezetimibe-Simvastatin) .... At Bedtime 4)  Azor 10-40 Mg  Tabs (Amlodipine-Olmesartan) .... Once Daily 5)  Multivitamin/iron   Tabs (Multiple Vitamins-Iron) .... Once Daily 6)  Testosterone Cypionate 200 Mg/ml Oil (Testosterone Cypionate) .... 1/2 Ml Every 2 Weeks 7)  Zolpidem Tartrate 10 Mg Tabs (Zolpidem Tartrate) .Marland Kitchen.. 1 At Bedtime As Needed 8)  Nasonex 50 Mcg/act Susp (Mometasone Furoate) .... Two Sprays Q Nare Daily  Allergies (verified): No Known Drug Allergies  Past History:  Past Medical History: Hypertension Colonic polyps, hx of  hyperplastic Chronic universal ulcerative colitis, since age 8 Hyperlipidemia hair loss Sleep Apnea  Past Surgical History: Sinus surgery  Family History: Family History Hypertension Fam hx Obesity Family History High cholesterol Family History Breast cancer 1st degree relative <50 Family History of CAD Male 1st degree relative <50 Family History of Colitis/Crohn's: 2 cousins with ulcerative colitis on paternal side. No FH of Colon Cancer:  Social History: Occupation:realtor Single Patient is a former smoker. Drug use-no Alcohol Use - yes: 1 per day  Daily Caffeine Use: 1 per day  Smoking Status:  quit  Review of Systems       The patient complains of allergy/sinus.         The pertinent positives and negatives are noted as above and in the HPI. All other ROS were reviewed and were negative.   Vital Signs:  Patient profile:   55 year old male Height:      73 inches Weight:      307 pounds BMI:     40.65 BSA:     2.58 Pulse rate:   76 / minute Pulse rhythm:   regular BP sitting:   136 / 82 Cuff size:   large  Vitals Entered By: Hope Pigeon CMA (Jan 12, 2010 10:06 AM)  Physical Exam  General:  Well developed, well nourished, no acute distress. Head:  Normocephalic and atraumatic. Eyes:  PERRLA, no icterus. Ears:  Normal auditory acuity. Mouth:  No deformity or lesions, dentition normal. Lungs:  Clear throughout to auscultation. Heart:  Regular rate and rhythm; no murmurs, rubs,  or bruits. Abdomen:  Soft, nontender and nondistended. No masses, hepatosplenomegaly  or hernias noted. Normal bowel sounds. Rectal:  deferred until time of colonoscopy.   Extremities:  No clubbing, cyanosis, edema or deformities noted. Neurologic:  Alert and  oriented x4;  grossly normal neurologically. Psych:  Alert and cooperative. Normal mood and affect.  Impression & Recommendations:  Problem # 1:  COLITIS, ULCERATIVE (ICD-556.9) Chronic ulcerative colitis remained stable  on sulfasalazine. A basic metabolic panel in September showed normal renal function. Renal function should be monitored every 6-12 months by physician prescribing sulfasalazine. Surveillance colonoscopy recommended in December 2011.  Patient Instructions: 1)  Please continue current medications.  2)  Pick up your prescription from your pharmacy.  3)  Please schedule a follow-up appointment in 1 year. 4)  Copy sent to : Benay Pillow, MD 5)  The medication list was reviewed and reconciled.  All changed / newly prescribed medications were explained.  A complete medication list was provided to the patient / caregiver.  Prescriptions: SULFAZINE EC 500 MG TBEC (SULFASALAZINE) 3 tablets by mouth two times a day  #180 x 11   Entered by:   Marlon Pel CMA ([place])   Authorized by:   Ladene Artist MD [facility]   Signed by:   Ladene Artist MD FACG on 01/12/2010   Method used:   Electronically to        [facility]  [address] [phone]* (retail)       [address]       [place], [place]  [address]       Ph: [phone] or [phone]       Fax: [phone]   RxID:   [id number]

## 2010-10-06 NOTE — Assessment & Plan Note (Signed)
Summary: 6 MONTH ROV/NJR   Vital Signs:  Patient profile:   56 year old male Height:      73 inches Weight:      308 pounds BMI:     40.78 Temp:     98.2 degrees F oral Pulse rate:   80 / minute Resp:     14 per minute BP sitting:   130 / 80  (left arm) Cuff size:   large  Vitals Entered By: Allyne Gee, LPN (November 18, 2593 6:38 AM) CC: roa labs   CC:  roa labs.  History of Present Illness: monitering of lipids and HTN witrh reveiwe of labs not taking the vytorin as scheduled weigth increased blod pressure in control monitering of testosteron replacement  Preventive Screening-Counseling & Management  Alcohol-Tobacco     Smoking Status: current     Packs/Day: 1/2  Problems Prior to Update: 1)  Acute Maxillary Sinusitis  (ICD-461.0) 2)  Postural Lightheadedness  (ICD-780.4) 3)  Allergic Rhinitis Cause Unspecified  (ICD-477.9) 4)  Preventive Health Care  (ICD-V70.0) 5)  Sleep Apnea  (ICD-780.57) 6)  Amaurosis Fugax  (ICD-362.34) 7)  Contact or Exposure To Other Viral Diseases  (ICD-V01.79) 8)  Hypogonadism, Male  (ICD-257.2) 9)  Uns Advrs Eff Uns Rx Medicinal&biological Sbstnc  (ICD-995.20) 10)  Disorders, 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) 11)  Sinusitis, Acute Nos  (ICD-461.9) 12)  Family History of Cad Male 1st Degree Relative <50  (ICD-V17.3) 13)  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) 14)  Hyperlipidemia  (ICD-272.4) 15)  Family History Breast Cancer 1st Degree Relative <50  (ICD-V16.3) 16)  Colitis, Ulcerative  (ICD-556.9) 17)  Colonic Polyps, Hx of  (ICD-V12.72) 18)  Hypertension  (ICD-401.9)  Current Problems (verified): 1)  Acute Maxillary Sinusitis  (ICD-461.0) 2)  Postural Lightheadedness  (ICD-780.4) 3)  Allergic Rhinitis Cause Unspecified  (ICD-477.9) 4)  Preventive Health Care  (ICD-V70.0) 5)  Sleep Apnea  (ICD-780.57) 6)  Amaurosis Fugax  (ICD-362.34) 7)  Contact or Exposure To Other Viral Diseases  (ICD-V01.79) 8)  Hypogonadism, Male   (ICD-257.2) 9)  Uns Advrs Eff Uns Rx Medicinal&biological Sbstnc  (ICD-995.20) 10)  Disorders, 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 At Bedtime 2)  Sulfazine Ec 500 Mg Tbec (Sulfasalazine) .... Three Two Times A Day 3)  Vytorin 10-20 Mg Tabs (Ezetimibe-Simvastatin) .... At Bedtime 4)  Azor 10-40 Mg  Tabs (Amlodipine-Olmesartan) .... Once Daily 5)  Multivitamin/iron   Tabs (Multiple Vitamins-Iron) .... Once Daily 6)  Testosterone Enanthate 200 Mg/ml Oil (Testosterone Enanthate) .Marland Kitchen.. 1cc Im   Monthly 7)  Zolpidem Tartrate 10 Mg Tabs (Zolpidem Tartrate) .Marland Kitchen.. 1 At Bedtime As Needed 8)  Nasonex 50 Mcg/act Susp (Mometasone Furoate) .... Two Sprays Q Nare Daily 9)  Levaquin 500 Mg Tabs (Levofloxacin) .... One By Mouth Daily For 10 Days 10)  Chlorphen-Pseudoeph-Methscop 8-120-2.5 Mg Xr12h-Tab (Chlorphen-Pseudoeph-Methscop) .... One By Mouth Two Times A Day For 10 Days 11)  Methylprednisolone 4 Mg Tabs (Methylprednisolone) .... 5 By Mouth Today, Then 4 For 1 Day The 3 For 1 Day Then 2  For 1 Day, The 1 For 1 Days  Current Medications (verified): 1)  Propecia 1 Mg Tabs (Finasteride) .... At Bedtime 2)  Sulfazine Ec 500 Mg Tbec (Sulfasalazine) .... Three Two Times A Day 3)  Vytorin 10-20 Mg Tabs (Ezetimibe-Simvastatin) .... At Bedtime 4)  Azor 10-40 Mg  Tabs (Amlodipine-Olmesartan) .... Once Daily 5)  Multivitamin/iron   Tabs (Multiple Vitamins-Iron) .... Once Daily 6)  Testosterone Enanthate 200 Mg/ml Oil (Testosterone Enanthate) .Marland Kitchen.. 1cc Im   Monthly 7)  Zolpidem  Tartrate 10 Mg Tabs (Zolpidem Tartrate) .Marland Kitchen.. 1 At Bedtime As Needed 8)  Nasonex 50 Mcg/act Susp (Mometasone Furoate) .... Two Sprays Q Nare Daily  Allergies (verified): No Known Drug Allergies  Past History:  Family History: Last updated: 05/28/2007 Family History Hypertension Fam hx Obesity Family History High cholesterol  Family History Breast cancer 1st degree relative <50 Family History of CAD Male 1st degree relative <50  Social History: Last updated: 05/28/2007 Current Smoker Alcohol use-yes Drug use-no Occupation:realtor Single  Risk Factors: Smoking Status: current (11/18/2009) Packs/Day: 1/2 (11/18/2009)  Past medical, surgical, family and social histories (including risk factors) reviewed, and no changes noted (except as noted below).  Past Medical History: Reviewed history from 05/28/2007 and no changes required. Hypertension Colonic polyps, hx of UC Hyperlipidemia hair loss  Past Surgical History: Reviewed history from 05/28/2007 and no changes required. Colonoscopy-10/02/2006 Sinus surgery  Family History: Reviewed history from 05/28/2007 and no changes required. Family History Hypertension Fam hx Obesity Family History High cholesterol  Family History Breast cancer 1st degree relative <50 Family History of CAD Male 1st degree relative <50  Social History: Reviewed history from 05/28/2007 and no changes required. Current Smoker Alcohol use-yes Drug use-no Occupation:realtor Single  Review of Systems  The patient denies anorexia, fever, weight loss, weight gain, vision loss, decreased hearing, hoarseness, chest pain, syncope, dyspnea on exertion, peripheral edema, prolonged cough, headaches, hemoptysis, abdominal pain, melena, hematochezia, severe indigestion/heartburn, hematuria, incontinence, genital sores, muscle weakness, suspicious skin lesions, transient blindness, difficulty walking, depression, unusual weight change, abnormal bleeding,  enlarged lymph nodes, angioedema, and breast masses.    Physical Exam  General:  Well-developed,well-nourished,in no acute distress; alert,appropriate and cooperative throughout examination Head:  normocephalic and focal alopecia.  male-pattern balding.   Eyes:  vision grossly intact, pupils equal, pupils round, pupils reactive to light, and nystagmus.   Mouth:  Oral mucosa and oropharynx without lesions or exudates.  Teeth in good repair. Neck:  No deformities, masses, or tenderness noted. Lungs:  R base dullness and L base dullness.   Heart:  Normal rate and regular rhythm. S1 and S2 normal without gallop, murmur, click, rub or other extra sounds. Abdomen:  Bowel sounds positive,abdomen soft and non-tender without masses, organomegaly or hernias noted.   Impression & Recommendations:  Problem # 1:  HYPERLIPIDEMIA (ALP-379.4)  His updated medication list for this problem includes:    Vytorin 10-20 Mg Tabs (Ezetimibe-simvastatin) .Marland Kitchen... At bedtime  Labs Reviewed: SGOT: 24 (11/11/2009)   SGPT: 25 (11/11/2009)  Lipid Goals: Chol Goal: 200 (05/28/2007)   HDL Goal: 40 (05/28/2007)   LDL Goal: 130 (05/28/2007)   TG Goal: 150 (05/28/2007)  Prior 10 Yr Risk Heart Disease: 11 % (08/09/2009)   HDL:47.70 (11/11/2009), 44.10 (05/07/2009)  LDL:98 (05/07/2009), 106 (03/24/2008)  Chol:210 (11/11/2009), 167 (05/07/2009)  Trig:220.0 (11/11/2009), 124.0 (05/07/2009)  Problem # 2:  HYPOGONADISM, MALE (ICD-257.2) will increasse the shots to every 2 weeks 1/2 cc  Problem # 3:  HYPERTENSION (ICD-401.9)  His updated medication  list for this problem includes:    Azor 10-40 Mg Tabs (Amlodipine-olmesartan) ..... 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 At bedtime 2)  Sulfazine Ec 500 Mg Tbec (Sulfasalazine) .... Three two times a day 3)  Vytorin 10-20 Mg Tabs (Ezetimibe-simvastatin) .... At bedtime 4)  Azor 10-40 Mg Tabs (Amlodipine-olmesartan) .... Once daily 5)  Multivitamin/iron Tabs (Multiple vitamins-iron) .... Once daily 6)  Testosterone Enanthate 200 Mg/ml Oil (Testosterone enanthate) .Marland Kitchen.. 1cc im   monthly 7)  Zolpidem Tartrate 10 Mg Tabs (Zolpidem tartrate) .Marland Kitchen.. 1 at bedtime as needed 8)  Nasonex 50 Mcg/act Susp (Mometasone furoate) .... Two sprays q nare daily  Patient Instructions: 1)  start 1/2 cc every 2 weeks 2)  Please schedule a follow-up appointment in 4 months. 3)  testosterone level 257.2 4)  Hepatic Panel prior to visit, ICD-9:995.20 5)  Lipid Panel prior to visit, ICD-9:272.4

## 2010-10-06 NOTE — Assessment & Plan Note (Signed)
Summary: back pain and needs testosterone injection/bmw   Vital Signs:  Patient profile:   55 year old male Height:      73 inches (185.42 cm) Weight:      301 pounds (136.82 kg) O2 Sat:      75 % Temp:     98.3 degrees F (36.83 degrees C) oral BP sitting:   142 / 84  (left arm) Cuff size:   large  Vitals Entered By: Gardenia Phlegm RMA (March 18, 2010 1:45 PM) CC: Back pain and needs testosterone injection/ CF Is Patient Diabetic? No   Primary Care Provider:  Benay Pillow, MD   CC:  Back pain and needs testosterone injection/ CF.  History of Present Illness: Increased pain had been seen by Raliegh Ip in the past ans was diagnoses with l3-4 radicular pain on the left the pain is now below the left knee and in the thigh and responds to IBU 600 mg QID The prednisone seemed to help while he was on it but this did not have a lasting effect No GI upset with the IBU celebrex resulted in diarrhea we do not have access to the MW films   Current Medications (verified): 1)  Propecia 1 Mg Tabs (Finasteride) .... At Bedtime 2)  Sulfazine Ec 500 Mg Tbec (Sulfasalazine) .... 3 Tablets By Mouth Two Times A Day 3)  Vytorin 10-20 Mg Tabs (Ezetimibe-Simvastatin) .... At Bedtime 4)  Azor 10-40 Mg  Tabs (Amlodipine-Olmesartan) .... Once Daily 5)  Multivitamin/iron   Tabs (Multiple Vitamins-Iron) .... Once Daily 6)  Testosterone Cypionate 200 Mg/ml Oil (Testosterone Cypionate) .... 1/2 Ml Every 2 Weeks 7)  Zolpidem Tartrate 10 Mg Tabs (Zolpidem Tartrate) .Marland Kitchen.. 1 At Bedtime As Needed 8)  Nasonex 50 Mcg/act Susp (Mometasone Furoate) .... Two Sprays Q Nare Daily 9)  Flexeril 10 Mg Tabs (Cyclobenzaprine Hcl) .... One Tid 10)  Prednisone (Pak) 10 Mg Tabs (Prednisone) .... As Directed  Allergies (verified): No Known Drug Allergies  Past History:  Family History: Last updated: 01/12/2010 Family History Hypertension Fam hx Obesity Family History High cholesterol Family History Breast cancer  1st degree relative <50 Family History of CAD Male 1st degree relative <50 Family History of Colitis/Crohn's: 2 cousins with ulcerative colitis on paternal side. No FH of Colon Cancer:  Social History: Last updated: 01/12/2010 Occupation:realtor Single Patient is a former smoker. Drug use-no Alcohol Use - yes: 1 per day  Daily Caffeine Use: 1 per day   Risk Factors: Smoking Status: quit (01/12/2010) Packs/Day: 1/2 (11/18/2009)  Past medical, surgical, family and social histories (including risk factors) reviewed, and no changes noted (except as noted below).  Past Medical History: Reviewed history from 01/12/2010 and no changes required. Hypertension Colonic polyps, hx of hyperplastic Chronic universal ulcerative colitis, since age 65 Hyperlipidemia hair loss Sleep Apnea  Past Surgical History: Reviewed history from 01/12/2010 and no changes required. Sinus surgery  Family History: Reviewed history from 01/12/2010 and no changes required. Family History Hypertension Fam hx Obesity Family History High cholesterol Family History Breast cancer 1st degree relative <50 Family History of CAD Male 1st degree relative <50 Family History of Colitis/Crohn's: 2 cousins with ulcerative colitis on paternal side. No FH of Colon Cancer:  Social History: Reviewed history from 01/12/2010 and no changes required. Occupation:realtor Single Patient is a former smoker. Drug use-no Alcohol Use - yes: 1 per day  Daily Caffeine Use: 1 per day   Review of Systems  The patient denies anorexia, fever, weight loss, weight  gain, vision loss, decreased hearing, hoarseness, chest pain, syncope, dyspnea on exertion, peripheral edema, prolonged cough, headaches, hemoptysis, abdominal pain, melena, hematochezia, severe indigestion/heartburn, hematuria, incontinence, genital sores, muscle weakness, suspicious skin lesions, transient blindness, difficulty walking, depression, unusual weight change,  abnormal bleeding, enlarged lymph nodes, angioedema, and breast masses.    Physical Exam  General:  Well-developed,well-nourished,in no acute distress; alert,appropriate and cooperative throughout examination Head:  normocephalic and focal alopecia.  male-pattern balding.   Eyes:  vision grossly intact, pupils equal, pupils round, pupils reactive to light, and nystagmus.   Ears:  R ear normal and L ear normal.   Nose:  no external deformity and no nasal discharge.   Neck:  No deformities, masses, or tenderness noted. Lungs:  R base dullness and L base dullness.   Heart:  Normal rate and regular rhythm. S1 and S2 normal without gallop, murmur, click, rub or other extra sounds. Abdomen:  Bowel sounds positive,abdomen soft and non-tender without masses, organomegaly or hernias noted. Msk:  left lift positive   Impression & Recommendations:  Problem # 1:  BACK PAIN, LUMBAR, WITH RADICULOPATHY (ICD-724.4)  His updated medication list for this problem includes:    Flexeril 10 Mg Tabs (Cyclobenzaprine hcl) ..... One tid    Ibuprofen 800 Mg Tab (Ibuprofen) .Marland Kitchen... Take one (1) tablet by mouth three times a day with food    Cyclobenzaprine Hcl 10 Mg Tabs (Cyclobenzaprine hcl) .Marland Kitchen... 1- 1/2 by mouth three times a day  Discussed use of moist heat or ice, modified activities, medications, and stretching/strengthening exercises. Back care instructions given. To be seen in 2 weeks if no improvement; sooner if worsening of symptoms.   Problem # 2:  HYPERTENSION (ICD-401.9)  His updated medication list for this problem includes:    Azor 10-40 Mg Tabs (Amlodipine-olmesartan) ..... Once daily  BP today: 142/84 Prior BP: 136/82 (01/12/2010)  Prior 10 Yr Risk Heart Disease: 11 % (08/09/2009)  Labs Reviewed: K+: 4.1 (05/07/2009) Creat: : 0.8 (05/07/2009)   Chol: 210 (11/11/2009)   HDL: 47.70 (11/11/2009)   LDL: 98 (05/07/2009)   TG: 220.0 (11/11/2009)  Problem # 3:  HYPERLIPIDEMIA (ICD-272.4)  His  updated medication list for this problem includes:    Vytorin 10-20 Mg Tabs (Ezetimibe-simvastatin) .Marland Kitchen... At bedtime  Labs Reviewed: SGOT: 24 (11/11/2009)   SGPT: 25 (11/11/2009)  Lipid Goals: Chol Goal: 200 (05/28/2007)   HDL Goal: 40 (05/28/2007)   LDL Goal: 130 (05/28/2007)   TG Goal: 150 (05/28/2007)  Prior 10 Yr Risk Heart Disease: 11 % (08/09/2009)   HDL:47.70 (11/11/2009), 44.10 (05/07/2009)  LDL:98 (05/07/2009), 106 (03/24/2008)  Chol:210 (11/11/2009), 167 (05/07/2009)  Trig:220.0 (11/11/2009), 124.0 (05/07/2009)  Complete Medication List: 1)  Propecia 1 Mg Tabs (Finasteride) .... At bedtime 2)  Sulfazine Ec 500 Mg Tbec (Sulfasalazine) .... 3 tablets by mouth two times a day 3)  Vytorin 10-20 Mg Tabs (Ezetimibe-simvastatin) .... At bedtime 4)  Azor 10-40 Mg Tabs (Amlodipine-olmesartan) .... Once daily 5)  Multivitamin/iron Tabs (Multiple vitamins-iron) .... Once daily 6)  Testosterone Cypionate 200 Mg/ml Oil (Testosterone cypionate) .... 1/2 ml every 2 weeks 7)  Zolpidem Tartrate 10 Mg Tabs (Zolpidem tartrate) .Marland Kitchen.. 1 at bedtime as needed 8)  Nasonex 50 Mcg/act Susp (Mometasone furoate) .... Two sprays q nare daily 9)  Flexeril 10 Mg Tabs (Cyclobenzaprine hcl) .... One tid 10)  Prednisone (pak) 10 Mg Tabs (Prednisone) .... As directed 11)  Ibuprofen 800 Mg Tab (Ibuprofen) .... Take one (1) tablet by mouth three times a day  with food 12)  Cyclobenzaprine Hcl 10 Mg Tabs (Cyclobenzaprine hcl) .Marland Kitchen.. 1- 1/2 by mouth three times a day  Other Orders: Admin of Therapeutic Inj  intramuscular or subcutaneous (53202)  Patient Instructions: 1)  Please schedule a follow-up appointment in 4 months. Prescriptions: CYCLOBENZAPRINE HCL 10 MG TABS (CYCLOBENZAPRINE HCL) 1- 1/2 by mouth three times a day  #30 x 1   Entered and Authorized by:   Ricard Dillon MD   Signed by:   Ricard Dillon MD on 03/18/2010   Method used:   Electronically to        [facility] (retail)        [address]       [place], [place]  [address]       Ph: [phone]       Fax: [phone]   RxID:   [id number] IBUPROFEN 800 MG TAB (IBUPROFEN) Take one (1) tablet by mouth three times a day with food  #45 x 3   Entered and Authorized by:   Ricard Dillon MD   Signed by:   Ricard Dillon MD on 03/18/2010   Method used:   Electronically to        [facility] (retail)       [address]       [place], [place]  [address]       Ph: [phone]       Fax: [phone]   RxID:   [id number]    Medication Administration  Injection # 1:    Medication: Testosterone Cypionat 258m ing    Diagnosis: HYPOGONADISM, MALE (ICD-257.2)    Route: IM    Site: R deltoid    Exp Date: 5/12    Lot #: [id number]   Mfr: sandoz    Comments: pt brought in meds    Patient tolerated injection without complications    Given by: CGardenia PhlegmRMA (March 18, 2010 1:55 PM)  Orders Added: 1)  Admin of Therapeutic Inj  intramuscular or subcutaneous [[id number]] 2)  Est. Patient Level IV [[94327]

## 2010-10-06 NOTE — Procedures (Signed)
Summary: Colonoscopy and biopsy   Colonoscopy  Procedure date:  06/21/2000  Findings:      Results: Hemorrhoids.     Location:  [place].    Procedures Next Due Date:    Colonoscopy: 07/2002 Patient Name: Richard [name], Richard [name] MRN: [id number] Procedure Procedures: Colonoscopy CPT: 43329.    with multiple biopsies. CPT: [phone]. There were greater than 10 biopsies taken.  Personnel: Endoscopist: [name]. Fuller Plan, MD, [name].  Referred By: Ricard Dillon, MD.  Exam Location: Exam performed in [facility]. Outpatient  Patient Consent: Procedure, Alternatives, Risks and Benefits discussed, consent obtained, from patient. Consent to be contacted was not given.  Indications  Surveillance of: Ulcerative Colitis. Last exam: May, 1997.  History  Pre-Exam Physical: Performed Jun 21, 2000. Cardio-pulmonary exam, Rectal exam, HEENT exam , Abdominal exam, Extremity exam, Neurological exam WNL.  Exam Exam: Extent of exam reached: Cecum, extent intended: Cecum.  The cecum was identified by appendiceal orifice and IC valve. Patient position: on left side. Colon retroflexion performed. Images taken. ASA Classification: I. Tolerance: good.  Monitoring: Pulse and BP monitoring, Oximetry used. Supplemental O2 given.  Colon Prep Used Golytely for colon prep. Dose Used: 4L. Prep results: fair, adequate exam.  Fluoroscopy: Fluoroscopy was not used.  Sedation Meds: Fentanyl 125 mcg. Versed 2 mg.  Findings MULTIPLE POLYPS: Transverse Colon. Comments: psuedopolyps biopsied.  MUCOSAL ABNORMALITY: Cecum to Transverse Colon. Granularity present, Biopsy/Mucosal Abn. taken.  NORMAL EXAM: Splenic Flexure to Rectum. Comments: biopsies taken.  HEMORRHOIDS: Internal. Size: Small. Not bleeding. Not thrombosed. ICD9: Hemorrhoids, Internal: 455.0. Comments: biopsies taken.    Comments: Multiple set of biopsies taken. Assessment Abnormal examination, see findings above.   Diagnoses: 455.0: Hemorrhoids, Internal.  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   Events  Unplanned Interventions: No intervention was required.  Unplanned Events: There were no complications. Plans Medication Plan: Await pathology. Continue current medications.  Patient Education: Patient given standard instructions for: Colitis. Patient instructed to get routine colonoscopy every 2 years.  Disposition: After procedure patient sent to recovery. After recovery patient sent home.  Scheduling/Referral: Clinic Visit, to [place]. Fuller Plan, MD, [facility], around Aug 02, 2000.  Primary Care Provider, to Ricard Dillon, MD, prn  Colonoscopy, to [facility] T. Fuller Plan, MD, [facility], around Jun 21, 2002.    This report was created from the original endoscopy report, which was reviewed and signed by the above listed endoscopist.    cc: Ricard Dillon, MD     SP Surgical Pathology - STATUS: Final             By: Lunette Stands,       Perform Date: 18Oct01 14:54  Ordered By: Gertha Calkin Date:  Facility: [facility]                              Department: [facility]   [address] [place], [place] [address]   [phone]    REPORT OF SURGICAL PATHOLOGY    Case #: [id number]   Patient Name: Richard [name], Richard [name]   PID: [id number]   Pathologist: Chrystie Nose. Saralyn Pilar, MD   DOB/Age Sep 02, 1956 (Age: 55) Gender: M   Date Taken: 06/21/00   Date Received: 06/21/00    FINAL DIAGNOSIS    ***MICROSCOPIC EXAMINATION AND DIAGNOSIS***    I. COLON,  ASCENDING: FOCAL ACTIVE COLITIS. NEGATIVE FOR   DYSPLASIA.    II. COLON, PROXIMAL TRANSVERSE: CHRONIC MILDLY ACTIVE COLITIS   CONSISTENT WITH INFLAMMATORY BOWEL DISEASE. NEGATIVE FOR   DYSPLASIA.    III. COLON, MID TRANSVERSE: CHRONIC MILDLY ACTIVE COLITIS   CONSISTENT WITH INFLAMMATORY BOWEL DISEASE. NEGATIVE FOR   DYSPLASIA.    IV. COLON, DESCENDING: CHRONIC MILDLY ACTIVE  COLITIS CONSISTENT   WITH INFLAMMATORY BOWEL DISEASE. NEGATIVE FOR DYSPLASIA.    V. COLON, SIGMOID: CHRONIC MILDLY ACTIVE COLITIS CONSISTENT WITH   INFLAMMATORY BOWEL DISEASE. NEGATIVE FOR DYSPLASIA.    VI. RECTUM: CHRONIC MILDLY ACTIVE COLITIS CONSISTENT WITH   INFLAMMATORY BOWEL DISEASE. NEGATIVE FOR DYSPLASIA.    COMMENT   I. The ascending biopsy shows normal crypt architecture and a   rare focus of mild neutrophilic cryptitis. This is not   associated with significant chronic changes and no granulomas are   identified.    II - VI. The biopsies are characterized by expansion of the   lamina propria by predominantly chronic inflammatory cell   infiltrates in association with crypt distortion and a few   scattered foci of neutrophilic cryptitis as well as a rare crypt   abscess. There is relative preservation of crypt epithelial   mucin and in fragments with attached submucosal most of the   inflammation appears confined to the mucosa which favors   ulcerative colitis over Crohn's. No dysplasia is identified in   any of the biopsies.    smr   Date Reported: 06/22/00 Chrystie Nose. Saralyn Pilar, MD   *** Electronically Signed Out By JDP ***    Clinical information   Colitis, R/O dysplasia. (tmc)    specimen(s) obtained   1: Colon, biopsy, ascending   2: Colon, biopsy, proximal transverse   3: Colon, biopsy, mid transverse   4: Colon, biopsy, descending   5: Colon, biopsy, sigmoid   6: Rectum, biopsy    Gross Description   I. Received in formalin is a tan, soft tissue fragment that is   submitted in toto. Size: 0.2 cm    II. Received in formalin are tan, soft tissue fragments that are   submitted in toto. Number: 2   Size: 0.1 and 0.2 cm    III. Received in formalin are tan, soft tissue fragments that are   submitted in toto. Number: 4   Size: 0.1 to 0.4 cm    IV. Received in formalin are tan, soft tissue fragments that are   submitted in toto. Number: 2   Size: 0.2 cm  each    V. Received in formalin is a tan, soft tissue fragment that is   submitted in toto. Size: 0.2 cm    VI. Received in formalin are tan, soft tissue fragments that are  submitted in toto. Number: 3   Size: 0.1 to 0.2 cm (SSW:atb 10/19)    ab/

## 2010-10-06 NOTE — Assessment & Plan Note (Signed)
Summary: testosterone inj//ccm  Nurse Visit   Allergies: No Known Drug Allergies  Medication Administration  Injection # 1:    Medication: Testosterone Cypionat 239m ing    Diagnosis: HYPOGONADISM, MALE (ICD-257.2)    Route: IM    Site: LUOQ gluteus    Exp Date: 03/03/2010    Lot #: [id number]   Mfr: sandoz    Patient tolerated injection without complications    Given by: BAllyne Gee LPN (June 28, 28616183:72PM)  Orders Added: 1)  Admin of patients own med IM/SQ [[id number]

## 2010-10-06 NOTE — Progress Notes (Signed)
Summary: no MRI yet per patient  Phone Note Call from Patient Call back at Home Phone [phone]   Caller: vm Reason for Call: Refill Medication Summary of Call: 1)Propecia refill to [facility] 2)Another round 6 day Prednisone.  Had a round about a month ago when I hurt back.   Therapist thinks this would help.  Still having pain in leg.  Had xray Raliegh Ip didn't show breakage, but pretty sure bulging disc.     Initial call taken by: Shelbie Hutching, RN,  March 09, 2010 1:47 PM  Follow-up for Phone Call        I refilled porpecia -what about prednisone? Follow-up by: Allyne Gee, LPN,  March 09, 1828 9:37 PM  Additional Follow-up for Phone Call Additional follow up Details #1::        needs an mri for persistant pain cannot keep on prednisone this would quide a shot in the back Additional Follow-up by: Ricard Dillon MD,  March 14, 2010 8:45 AM    Additional Follow-up for Phone Call Additional follow up Details #2::    Patient going to see an accupuncturist today.  Cannot afford $1000 for an MRI &wants to wait.   Follow-up by: Shelbie Hutching, RN,  March 14, 2010 12:32 PM

## 2010-10-06 NOTE — Procedures (Signed)
Summary: Conservation officer, historic buildings   Imported By: Phillis Knack 01/12/2010 14:56:22  _____________________________________________________________________  External Attachment:    Type:   Image     Comment:   External Document

## 2010-10-06 NOTE — Progress Notes (Signed)
Summary: back injury ov req  Phone Note Call from Patient Call back at Home Phone [phone]   Reason for Call: Acute Illness, Talk to Nurse, Referral Summary of Call: OV requested for hurt back over weekend, picking up bags of mulch yard work.  A lot of pain.  Doing heating pad & Ibuprofen 3 every 6 hrs.  Lower left back & left thigh radiation.  [facility].  NKDA.   Initial call taken by: Shelbie Hutching, RN,  February 14, 2010 8:22 AM  Follow-up for Phone Call        per dr Arnoldo Morale- may have flexeril 10 three times a day-#30- and prednisone 10 mg dose pack Follow-up by: Allyne Gee, LPN,  February 14, 1029 1:31 AM    New/Updated Medications: FLEXERIL 10 MG TABS (CYCLOBENZAPRINE HCL) One tid PREDNISONE (PAK) 10 MG TABS (PREDNISONE) As directed Prescriptions: PREDNISONE (PAK) 10 MG TABS (PREDNISONE) As directed  #1 x 0   Entered by:   Shelbie Hutching, RN   Authorized by:   Ricard Dillon MD   Signed by:   Shelbie Hutching, RN on 02/14/2010   Method used:   Electronically to        [facility] [phone]* (retail)       [place], [place]  [address]       Ph: [phone] or [phone]       Fax: [phone]   RxID:   [id number] FLEXERIL 10 MG TABS (CYCLOBENZAPRINE HCL) One tid  #30 x 0   Entered by:   Shelbie Hutching, RN   Authorized by:   Ricard Dillon MD   Signed by:   Shelbie Hutching, RN on 02/14/2010   Method used:   Electronically to        [facility]. [phone]* (retail)       [address]       [place],   [address]       Ph: [phone] or [phone]       Fax: [phone]   RxID:   [id number]

## 2010-10-06 NOTE — Assessment & Plan Note (Signed)
Summary: testosterone inj/njr  Nurse Visit   Allergies: No Known Drug Allergies  Medication Administration  Injection # 1:    Medication: Testosterone Cypionat 287m ing    Diagnosis: HYPOGONADISM, MALE (ICD-257.2)    Site: RUOQ gluteus    Exp Date: 01/15/2011    Lot #: [id number]   Mfr: sandoz    Comments: .5 ml given    Patient tolerated injection without complications    Given by: BAllyne Gee LPN (December 22, 2195012:42 PM)  Orders Added: 1)  Admin of patients own med IM/SQ [[phone]

## 2010-10-06 NOTE — Progress Notes (Signed)
Summary: Medication refill  Phone Note Call from Patient Call back at Home Phone [phone]   Caller: Patient Call For: Dr. Fuller Plan Reason for Call: Refill Medication Summary of Call: refill of Sulfazine  [facility]--appt on 01-12-10 Initial call taken by: Webb Laws,  December 07, 2009 11:46 AM  Follow-up for Phone Call        One refill sent to his pharmacy but he must be seen for further refills and pt notified. Pt agreed. Follow-up by: Marlon Pel CMA ([facility]),  December 07, 2009 1:14 PM    New/Updated Medications: SULFAZINE EC 500 MG TBEC (SULFASALAZINE) 3 tablets by mouth two times a day Prescriptions: SULFAZINE EC 500 MG TBEC (SULFASALAZINE) 3 tablets by mouth two times a day  #180 x 0   Entered by:   Marlon Pel CMA ([place])   Authorized by:   Ladene Artist MD [facility]   Signed by:   Marlon Pel CMA ([place]) on 12/07/2009   Method used:   Electronically to        [facility]. [phone]* (retail)       [address]       [place], [place]  [address]       Ph: [phone] or [phone]       Fax: [phone]   RxID:   [id number]

## 2010-10-06 NOTE — Assessment & Plan Note (Signed)
Summary: testosterone inj/cjr  Nurse Visit   Allergies: No Known Drug Allergies  Medication Administration  Injection # 1:    Medication: Testosterone Cypionat 261m ing    Diagnosis: HYPOGONADISM, MALE (ICD-257.2)    Route: IM    Site: LUOQ gluteus    Exp Date: 02/02/2011    Lot #: [id number]   Mfr: sandoz    Comments: 1.550m300mg    Patient tolerated injection without complications    Given by: BoAllyne GeeLPN (June  9, 207482:7:07M)  Orders Added: 1)  Admin of patients own med IM/SQ [[id number]

## 2010-10-06 NOTE — Procedures (Signed)
Summary: Colonoscopy    Colonoscopy  Procedure date:  07/18/2004  Findings:      Results: Hemorrhoids.     Results: Colitis.       Location:  [facility].    Procedures Next Due Date:    Colonoscopy: 07/2006 Patient Name: Richard, [name] MRN: [id number] Procedure Procedures: Colonoscopy CPT: 72094.    with biopsy. CPT: [id number].  Personnel: Endoscopist: [name]. Fuller Plan, MD, [name].  Exam Location: Exam performed in Outpatient Clinic. Outpatient  Patient Consent: Procedure, Alternatives, Risks and Benefits discussed, consent obtained, from patient. Consent was obtained by the RN.  Indications Symptoms: Hematochezia.  Surveillance of: Ulcerative Colitis.  History  Current Medications: Patient is not currently taking Coumadin.  Pre-Exam Physical: Performed Jul 18, 2004. Entire physical exam was normal.  Exam Exam: Extent of exam reached: Cecum, extent intended: Cecum.  The cecum was identified by appendiceal orifice and IC valve. Colon retroflexion performed. ASA Classification: II. Tolerance: excellent.  Monitoring: Pulse and BP monitoring, Oximetry used. Supplemental O2 given.  Colon Prep Used Miralax for colon prep. Prep results: fair, adequate exam.  Sedation Meds: Patient assessed and found to be appropriate for moderate (conscious) sedation. Fentanyl 75 mcg. given IV. Versed 10 mg. given IV.  Findings MUCOSAL ABNORMALITY: Transverse Colon. Erythema present. Granularity present, Activity level mild, Biopsy/Mucosal Abn. taken. ICD9: Colitis, Ulcerative: 556.9. Comments: patchy colitis around 60cm .  NORMAL EXAM: Descending Colon to Splenic Flexure. Not Seen: Colitis. Biopsy/Normal Exam taken. Comments: random biosies every 10cm.  NORMAL EXAM: Cecum to Hepatic Flexure. Not Seen: Colitis. Biopsy/Normal Exam taken. Comments: random biopsies every 10cm.  MUCOSAL ABNORMALITY: Sigmoid Colon to Rectum. Erythema present. Activity level mild, Biopsy/Mucosal  Abn. taken. ICD9: Colitis, Ulcerative: 556.9. Comments: random biopsies every 10cm.  HEMORRHOIDS: Internal. Size: Small. Not bleeding. Not thrombosed. ICD9: Hemorrhoids, Internal: 455.0.   Assessment  Diagnoses: 556.9: Colitis, Ulcerative.  455.0: Hemorrhoids, Internal.   Events  Unplanned Interventions: No intervention was required.  Unplanned Events: There were no complications. Plans Medication Plan: Await pathology. Continue current medications.  Patient Education: Patient given standard instructions for: Colitis. Hemorrhoids.  Disposition: After procedure patient sent to recovery. After recovery patient sent home.  Scheduling/Referral: Colonoscopy, to [facility] Fuller Plan, MD, [facility], with a more extensive bowel prep, around Jul 18, 2006.  Office Visit, to [employer]. Fuller Plan, MD, [facility], around Jul 18, 2005.    This report was created from the original endoscopy report, which was reviewed and signed by the above listed endoscopist.

## 2010-10-06 NOTE — Procedures (Signed)
Summary: Colonoscopy   Colonoscopy  Procedure date:  06/20/2002  Findings:      Results: Hemorrhoids.     Results: Colitis.       Location:  [place].    Procedures Next Due Date:    Colonoscopy: 07/2004 Patient Name: Stanley, Lyness MRN: [id number] Procedure Procedures: Colonoscopy CPT: 95638.    with biopsy. CPT: [id number].  Personnel: Endoscopist: [name]. Fuller Plan, MD, [name].  Exam Location: Exam performed in Outpatient Clinic. Outpatient  Patient Consent: Procedure, Alternatives, Risks and Benefits discussed, consent obtained, from patient. Consent was obtained by the RN.  Indications  Surveillance of: Ulcerative Colitis.  History  Pre-Exam Physical: Performed Jun 20, 2002. Cardio-pulmonary exam, Rectal exam, HEENT exam , Abdominal exam, Extremity exam, Neurological exam, Mental status exam WNL.  Exam Exam: Extent of exam reached: Cecum, extent intended: Cecum.  The cecum was identified by appendiceal orifice and IC valve. Colon retroflexion performed. ASA Classification: II. Tolerance: good.  Monitoring: Pulse and BP monitoring, Oximetry used. Supplemental O2 given.  Colon Prep Used Golytely for colon prep. Prep results: good.  Sedation Meds: Patient assessed and found to be appropriate for moderate (conscious) sedation. Fentanyl 100 mcg. given IV. Versed 12 given IV.  Findings - MUCOSAL ABNORMALITY: Transverse Colon to Rectum. Granularity present, Friability: mild. Activity level mild, Endoscopic Extent of Disease: Left-sided Colitis. Biopsy/Mucosal Abn. taken. ICD9: Colitis, Ulcerative: 556.9. Comments: biopsies taken every 10cm.  NORMAL EXAM: Cecum to Transverse Colon. Biopsy/Normal Exam taken. Comments: biopsies taken every 10cm.  HEMORRHOIDS: Internal. Size: Small. Not bleeding. Not thrombosed. ICD9: Hemorrhoids, Internal: 455.0.   Assessment  Diagnoses: 556.9: Colitis, Ulcerative.  455.0: Hemorrhoids, Internal.    Events  Unplanned Interventions: No intervention was required.  Unplanned Events: There were no complications. Plans Medication Plan: Await pathology. Continue current medications.  Patient Education: Patient given standard instructions for: Colitis.  Disposition: After procedure patient sent to recovery. After recovery patient sent home.  Scheduling/Referral: Colonoscopy, to [facility] T. Fuller Plan, [facility], [facility], around Jun 20, 2004.  Primary Care Provider, to Ricard Dillon, MD,    This report was created from the original endoscopy report, which was reviewed and signed by the above listed endoscopist.    cc: Ricard Dillon, MD

## 2010-10-06 NOTE — Assessment & Plan Note (Signed)
Summary: TESTOSTERONE INJ//CCM  Nurse Visit   Allergies: No Known Drug Allergies  Medication Administration  Injection # 1:    Medication: Testosterone Cypionat 221m ing    Diagnosis: HYPOGONADISM, MALE (ICD-257.2)    Route: IM    Site: RUOQ gluteus    Exp Date: 01/15/2011    Lot #: [id number]   Mfr: sandoz    Comments: .529mgiven    Patient tolerated injection without complications    Given by: BoAllyne GeeLPN (October 28, 2029242:16 PM)  Orders Added: 1)  Admin of patients own med IM/SQ [phone]

## 2010-10-06 NOTE — Procedures (Signed)
Summary: Colonoscopy/[facility]  Colonoscopy/[facility]   Imported By: Phillis Knack 01/12/2010 14:48:02  _____________________________________________________________________  External Attachment:    Type:   Image     Comment:   External Document

## 2010-10-18 ENCOUNTER — Encounter: Payer: Self-pay | Admitting: Internal Medicine

## 2010-10-19 ENCOUNTER — Ambulatory Visit: Payer: Self-pay | Admitting: Internal Medicine

## 2010-10-19 DIAGNOSIS — E291 Testicular hypofunction: Secondary | ICD-10-CM

## 2010-10-19 MED ORDER — TESTOSTERONE CYPIONATE 200 MG/ML IM SOLN
100.0000 mg | INTRAMUSCULAR | Status: DC
  Administered 2010-10-19: 100 mg via INTRAMUSCULAR

## 2010-11-21 ENCOUNTER — Other Ambulatory Visit: Payer: Self-pay | Admitting: *Deleted

## 2010-11-21 MED ORDER — AMLODIPINE-OLMESARTAN 10-40 MG PO TABS: 1.0000 | ORAL_TABLET | Freq: Every day | ORAL | Status: DC

## 2010-11-23 ENCOUNTER — Ambulatory Visit (INDEPENDENT_AMBULATORY_CARE_PROVIDER_SITE_OTHER): Payer: BC Managed Care – PPO | Admitting: Internal Medicine

## 2010-11-23 DIAGNOSIS — E291 Testicular hypofunction: Secondary | ICD-10-CM

## 2010-11-23 MED ORDER — TESTOSTERONE CYPIONATE 200 MG/ML IM SOLN
100.0000 mg | INTRAMUSCULAR | Status: DC
  Administered 2010-11-23 – 2011-06-21 (×3): 100 mg via INTRAMUSCULAR

## 2010-12-21 ENCOUNTER — Telehealth: Payer: Self-pay | Admitting: Internal Medicine

## 2010-12-21 NOTE — Telephone Encounter (Signed)
Pt req to get refill of Testosterone 200 mg . Pts pharmacy, told pt that he would need to contact Dr Arnoldo Morale office to get refills for this med. Pls call in to [facility]

## 2010-12-21 NOTE — Telephone Encounter (Signed)
Called in.

## 2010-12-30 ENCOUNTER — Ambulatory Visit (INDEPENDENT_AMBULATORY_CARE_PROVIDER_SITE_OTHER): Payer: BC Managed Care – PPO | Admitting: Internal Medicine

## 2010-12-30 DIAGNOSIS — E291 Testicular hypofunction: Secondary | ICD-10-CM

## 2010-12-30 MED ORDER — TESTOSTERONE CYPIONATE 200 MG/ML IM SOLN
200.0000 mg | INTRAMUSCULAR | Status: DC
  Administered 2010-12-30: 200 mg via INTRAMUSCULAR

## 2011-01-09 ENCOUNTER — Other Ambulatory Visit: Payer: Self-pay | Admitting: Gastroenterology

## 2011-01-10 NOTE — Telephone Encounter (Signed)
NEEDS OFFICE VISIT FOR ANY FURTHER REFILLS! 

## 2011-01-17 NOTE — Procedures (Signed)
NAME:  Richard [name], Richard [name] NO.:  [id number]   MEDICAL RECORD NO.:  [id number]          PATIENT TYPE:  OUT   LOCATION:  SLEEP CENTER                 FACILITY[facility]  [facility]   PHYSICIAN:  Kathee Delton, MD,FCCPDATE OF BIRTH:  [birth year] 1957   DATE OF STUDY:  01/29/2009                            NOCTURNAL POLYSOMNOGRAM   REFERRING PHYSICIAN:  Ricard Dillon, MD   LOCATION:  Sleep Lab.   INDICATION FOR THE STUDY:  Hypersomnia with sleep apnea.   EPWORTH SCORE:  4.   SLEEP ARCHITECTURE:  The patient had a total sleep time of 294 minutes  with decreased slow wave sleep as well as REM.  Sleep onset latency was  normal at 19 minutes, and REM onset was quite prolonged.  Sleep  efficiency was poor at 69%.   RESPIRATORY DATA:  The patient underwent a split night study, where he  was found to have 167 events in the first 109 minutes of sleep.  This  gave him an apnea/hypopnea index of 92 events per hour during the  diagnostic portion of the study.  The events were not positional, and  there was very loud snoring noted throughout.  By protocol, the patient  was fitted with a large ResMed Quattro full-face mask, and CPAP  titration was initiated.  The patient appeared to have adequate control  of his obstructive events at a pressure of 16 cm; however, had pressure-  induced central apneas at 17 cm or greater.  He did have some mild  snoring breakthrough despite 16 cm of pressure and a rare obstructive  event.   OXYGEN DATA:  There was O2 desaturation as low as 79% with the patient's  obstructive events.   CARDIAC DATA:  Rare PVC, but no clinically significant arrhythmia seen.   MOVEMENT/PARASOMNIA:  The patient was found to have moderate numbers of  leg jerks primarily during the last half of the night, but did not  result in arousal or awakening.   IMPRESSION/RECOMMENDATIONS:  1. Severe obstructive sleep apnea/hypopnea syndrome with an AHI during      the diagnostic portion  of the study of 92 events per hour and      oxygen desaturation as low as 79%.  The patient was then fitted      with a large ResMed Quattro full-face mask, and found to have an      optimal pressure of 16 cm of water.  The patient did have mild      snoring breakthroughs and a few obstructive events on the pressure      of 16, however, had significant pressure-induced central apneas at      pressures greater than 16 cm of water.  I would recommend      initiating the patient at 10 cm of water pressure, and gradually      titrating him up over 4-8 weeks to a final pressure of 16.  The      patient      should also be encouraged to work aggressively on weight loss.  2. Rare PVCs seen, but no clinically significant arrhythmias.       Kathee Delton,  MD,FCCP  Diplomate, Tax adviser of Sleep  Medicine  Electronically Signed     [facility]/MEDQ  D:  02/09/2009 14:28:32  T:  02/10/2009 05:15:24  Job:  [id number]

## 2011-02-16 ENCOUNTER — Ambulatory Visit (INDEPENDENT_AMBULATORY_CARE_PROVIDER_SITE_OTHER): Payer: BC Managed Care – PPO | Admitting: Internal Medicine

## 2011-02-16 DIAGNOSIS — E291 Testicular hypofunction: Secondary | ICD-10-CM

## 2011-02-16 MED ORDER — TESTOSTERONE CYPIONATE 200 MG/ML IM SOLN: 200.0000 mg | INTRAMUSCULAR | Status: DC

## 2011-02-20 ENCOUNTER — Other Ambulatory Visit: Payer: Self-pay | Admitting: Gastroenterology

## 2011-02-20 ENCOUNTER — Other Ambulatory Visit: Payer: Self-pay | Admitting: Internal Medicine

## 2011-02-27 ENCOUNTER — Ambulatory Visit (INDEPENDENT_AMBULATORY_CARE_PROVIDER_SITE_OTHER): Payer: BC Managed Care – PPO | Admitting: Gastroenterology

## 2011-02-27 ENCOUNTER — Encounter: Payer: Self-pay | Admitting: Gastroenterology

## 2011-02-27 VITALS — BP 144/84 | HR 88 | Ht 73.0 in | Wt 303.0 lb

## 2011-02-27 DIAGNOSIS — K5190 Ulcerative colitis, unspecified, without complications: Secondary | ICD-10-CM

## 2011-02-27 MED ORDER — SULFASALAZINE 500 MG PO TBEC: DELAYED_RELEASE_TABLET | ORAL | Status: DC

## 2011-02-27 NOTE — Patient Instructions (Addendum)
Pick up your prescription at your pharmacy.  You Colonoscopy recall has been changed to January 2013. cc: Benay Pillow, MD

## 2011-02-27 NOTE — Progress Notes (Signed)
History of Present Illness: This is a 55 year old male with long-standing universal ulcerative colitis who returns for followup. His symptoms are under excellent control on Azulfidine. He is overdue for his two-year surveillance colonoscopy he would like to defer this exam due to financial constraints with a very high deductible on his health insurance plan.  Current Medications, Allergies, Past Medical History, Past Surgical History, Family History and Social History were reviewed in [employer] record.  Physical Exam: General: Well developed , well nourished, no acute distress Head: Normocephalic and atraumatic Eyes:  sclerae anicteric, EOMI Ears: Normal auditory acuity Mouth: No deformity or lesions Lungs: Clear throughout to auscultation Heart: Regular rate and rhythm; no murmurs, rubs or bruits Abdomen: Soft, non tender and non distended. No masses, hepatosplenomegaly or hernias noted. Normal Bowel sounds Musculoskeletal: Symmetrical with no gross deformities  Pulses:  Normal pulses noted Extremities: No clubbing, cyanosis, edema or deformities noted Neurological: Alert oriented x 4, grossly nonfocal Psychological:  Alert and cooperative. Normal mood and affect  Assessment and Recommendations: 1. Universal ulcerative colitis, long-standing. Symptoms well-controlled on Azulfidine. Renew Azulfidine 1.5 g twice a day. He states he will be scheduling a physical exam within the next few weeks with Dr. Arnoldo Morale and the patient would like [place] to monitor his CBC and renal function at that time. He would like to defer colonoscopy due to financial reasons for several months so I have placed a recall colonoscopy for January 2013.

## 2011-03-20 ENCOUNTER — Other Ambulatory Visit: Payer: Self-pay | Admitting: *Deleted

## 2011-03-20 MED ORDER — ZOLPIDEM TARTRATE 10 MG PO TABS: 10.0000 mg | ORAL_TABLET | Freq: Every evening | ORAL | Status: DC | PRN

## 2011-03-31 ENCOUNTER — Ambulatory Visit (INDEPENDENT_AMBULATORY_CARE_PROVIDER_SITE_OTHER): Payer: BC Managed Care – PPO | Admitting: Internal Medicine

## 2011-03-31 DIAGNOSIS — E291 Testicular hypofunction: Secondary | ICD-10-CM

## 2011-03-31 MED ORDER — TESTOSTERONE CYPIONATE 200 MG/ML IM SOLN
200.0000 mg | Freq: Once | INTRAMUSCULAR | Status: AC
  Administered 2011-03-31: 200 mg via INTRAMUSCULAR

## 2011-05-04 ENCOUNTER — Ambulatory Visit (INDEPENDENT_AMBULATORY_CARE_PROVIDER_SITE_OTHER): Payer: BC Managed Care – PPO | Admitting: Internal Medicine

## 2011-05-04 DIAGNOSIS — E291 Testicular hypofunction: Secondary | ICD-10-CM

## 2011-05-04 MED ORDER — TESTOSTERONE CYPIONATE 200 MG/ML IM SOLN
200.0000 mg | INTRAMUSCULAR | Status: DC
  Administered 2011-05-04: 200 mg via INTRAMUSCULAR

## 2011-06-21 ENCOUNTER — Ambulatory Visit (INDEPENDENT_AMBULATORY_CARE_PROVIDER_SITE_OTHER): Payer: BC Managed Care – PPO | Admitting: Internal Medicine

## 2011-06-21 DIAGNOSIS — E291 Testicular hypofunction: Secondary | ICD-10-CM

## 2011-06-21 MED ORDER — TESTOSTERONE CYPIONATE 200 MG/ML IM SOLN: 200.0000 mg | INTRAMUSCULAR | Status: DC

## 2011-08-01 ENCOUNTER — Other Ambulatory Visit: Payer: Self-pay | Admitting: Internal Medicine

## 2011-08-02 ENCOUNTER — Ambulatory Visit (INDEPENDENT_AMBULATORY_CARE_PROVIDER_SITE_OTHER): Payer: BC Managed Care – PPO | Admitting: Internal Medicine

## 2011-08-02 DIAGNOSIS — E291 Testicular hypofunction: Secondary | ICD-10-CM

## 2011-08-02 MED ORDER — TESTOSTERONE CYPIONATE 200 MG/ML IM SOLN
200.0000 mg | Freq: Once | INTRAMUSCULAR | Status: AC
  Administered 2011-08-02: 200 mg via INTRAMUSCULAR

## 2011-09-14 ENCOUNTER — Ambulatory Visit: Payer: BC Managed Care – PPO | Admitting: Internal Medicine

## 2011-09-15 ENCOUNTER — Ambulatory Visit (INDEPENDENT_AMBULATORY_CARE_PROVIDER_SITE_OTHER): Payer: BC Managed Care – PPO | Admitting: Internal Medicine

## 2011-09-15 DIAGNOSIS — E291 Testicular hypofunction: Secondary | ICD-10-CM

## 2011-09-15 MED ORDER — TESTOSTERONE CYPIONATE 200 MG/ML IM SOLN
200.0000 mg | Freq: Once | INTRAMUSCULAR | Status: AC
  Administered 2011-09-15: 200 mg via INTRAMUSCULAR

## 2011-09-30 ENCOUNTER — Other Ambulatory Visit: Payer: Self-pay | Admitting: Internal Medicine

## 2011-09-30 ENCOUNTER — Other Ambulatory Visit: Payer: Self-pay | Admitting: Gastroenterology

## 2011-10-02 ENCOUNTER — Other Ambulatory Visit: Payer: Self-pay | Admitting: *Deleted

## 2011-10-02 MED ORDER — ZOLPIDEM TARTRATE 10 MG PO TABS: 10.0000 mg | ORAL_TABLET | Freq: Every evening | ORAL | Status: DC | PRN

## 2011-10-04 ENCOUNTER — Other Ambulatory Visit: Payer: Self-pay | Admitting: Gastroenterology

## 2011-10-05 ENCOUNTER — Ambulatory Visit (INDEPENDENT_AMBULATORY_CARE_PROVIDER_SITE_OTHER): Payer: BC Managed Care – PPO | Admitting: Internal Medicine

## 2011-10-05 DIAGNOSIS — E291 Testicular hypofunction: Secondary | ICD-10-CM

## 2011-10-05 MED ORDER — TESTOSTERONE CYPIONATE 200 MG/ML IM SOLN: 200.0000 mg | INTRAMUSCULAR | Status: DC

## 2011-10-05 MED ORDER — TESTOSTERONE CYPIONATE 200 MG/ML IM SOLN
200.0000 mg | INTRAMUSCULAR | Status: DC
  Administered 2011-10-05: 200 mg via INTRAMUSCULAR

## 2011-10-05 MED ORDER — SULFASALAZINE 500 MG PO TBEC: DELAYED_RELEASE_TABLET | ORAL | Status: DC

## 2011-10-05 NOTE — Telephone Encounter (Signed)
A prescription has been sent to the pharmacy.

## 2011-10-10 ENCOUNTER — Ambulatory Visit (INDEPENDENT_AMBULATORY_CARE_PROVIDER_SITE_OTHER): Payer: BC Managed Care – PPO | Admitting: Gastroenterology

## 2011-10-10 ENCOUNTER — Encounter: Payer: Self-pay | Admitting: Gastroenterology

## 2011-10-10 VITALS — BP 120/72 | HR 88 | Ht 73.0 in | Wt 293.4 lb

## 2011-10-10 DIAGNOSIS — K5190 Ulcerative colitis, unspecified, without complications: Secondary | ICD-10-CM

## 2011-10-10 MED ORDER — SULFASALAZINE 500 MG PO TBEC: DELAYED_RELEASE_TABLET | ORAL | Status: DC

## 2011-10-10 NOTE — Patient Instructions (Addendum)
Your prescription for sulfasalazine has been sent to your pharmacy. Please get labs done at Dr. Arnoldo Morale office that at least includes kidney function, and complete blood count.  Per your request we have postponed your Colonoscopy recall due to your high deductible and have put in the new recall in for 09/2012. We do recommend you call sooner to schedule your Colonoscopy. cc: Benay Pillow, MD

## 2011-10-10 NOTE — Progress Notes (Signed)
History of Present Illness: This is a  A 56 year old male with reported long-standing universal ulcerative colitis who returns for routine followup. He requested to defer his standard 2 year surveillance colonoscopy last year for financial reasons. He has had hyperplastic colon polyps noted on prior colonoscopies. He has not seen Dr. Arnoldo Morale for about 1-1/2 years. Denies weight loss, abdominal pain, constipation, diarrhea, change in stool caliber, melena, hematochezia, nausea, vomiting, dysphagia, reflux symptoms, chest pain.  Current Medications, Allergies, Past Medical History, Past Surgical History, Family History and Social History were reviewed in [employer] record.  Physical Exam: General: Well developed , well nourished, no acute distress Head: Normocephalic and atraumatic Eyes:  sclerae anicteric, EOMI Ears: Normal auditory acuity Mouth: No deformity or lesions Lungs: Clear throughout to auscultation Heart: Regular rate and rhythm; no murmurs, rubs or bruits Abdomen: Soft, non tender and non distended. No masses, hepatosplenomegaly or hernias noted. Normal Bowel sounds Musculoskeletal: Symmetrical with no gross deformities  Pulses:  Normal pulses noted Extremities: No clubbing, cyanosis, edema or deformities noted Neurological: Alert oriented x 4, grossly nonfocal Psychological:  Alert and cooperative. Normal mood and affect  Assessment and Recommendations:  1. Chronic universal colitis. Continue Azulfidine 1.5 g twice a day. Recommended to proceed with a chemistry panel and CBC for long-term monitoring of potential sulfasalazine side effects but he states he would prefer to do this with his complete physical exam with Dr. Arnoldo Morale. He understands the risks of not having appropriate blood work performed to monitor for potential medication side effects. If he does complete an appointment with Dr. Arnoldo Morale within the next 3 months I have asked him to return here for  blood work. He is overdue for surveillance colonoscopy and again would like to defer colonoscopy because of a high deductible health insurance policy. He understands that he is at increased risk of colorectal cancer given his chronic ulcerative colitis and that screening is recommended in most guidelines every 1-2 years. We will place a colonoscopy recall for January 2014 and I advised him to call before then if he is ready to proceed with colonoscopy.

## 2011-11-10 ENCOUNTER — Ambulatory Visit (INDEPENDENT_AMBULATORY_CARE_PROVIDER_SITE_OTHER): Payer: BC Managed Care – PPO | Admitting: Internal Medicine

## 2011-11-10 DIAGNOSIS — E291 Testicular hypofunction: Secondary | ICD-10-CM

## 2011-11-10 MED ORDER — TESTOSTERONE CYPIONATE 200 MG/ML IM SOLN
200.0000 mg | INTRAMUSCULAR | Status: AC
  Administered 2011-11-10: 200 mg via INTRAMUSCULAR

## 2011-12-19 ENCOUNTER — Other Ambulatory Visit: Payer: Self-pay | Admitting: Internal Medicine

## 2011-12-21 ENCOUNTER — Other Ambulatory Visit: Payer: Self-pay | Admitting: Internal Medicine

## 2011-12-21 ENCOUNTER — Ambulatory Visit (INDEPENDENT_AMBULATORY_CARE_PROVIDER_SITE_OTHER): Payer: BC Managed Care – PPO | Admitting: Internal Medicine

## 2011-12-21 DIAGNOSIS — E291 Testicular hypofunction: Secondary | ICD-10-CM

## 2011-12-21 MED ORDER — TESTOSTERONE CYPIONATE 200 MG/ML IM SOLN
200.0000 mg | INTRAMUSCULAR | Status: AC
  Administered 2011-12-21: 200 mg via INTRAMUSCULAR

## 2012-02-22 ENCOUNTER — Ambulatory Visit (INDEPENDENT_AMBULATORY_CARE_PROVIDER_SITE_OTHER): Payer: BC Managed Care – PPO | Admitting: Internal Medicine

## 2012-02-22 DIAGNOSIS — E291 Testicular hypofunction: Secondary | ICD-10-CM

## 2012-02-22 MED ORDER — TESTOSTERONE CYPIONATE 200 MG/ML IM SOLN
200.0000 mg | INTRAMUSCULAR | Status: AC
  Administered 2012-02-22: 200 mg via INTRAMUSCULAR

## 2012-03-14 ENCOUNTER — Ambulatory Visit (INDEPENDENT_AMBULATORY_CARE_PROVIDER_SITE_OTHER): Payer: BC Managed Care – PPO | Admitting: Family

## 2012-03-14 ENCOUNTER — Other Ambulatory Visit: Payer: Self-pay | Admitting: Family Medicine

## 2012-03-14 ENCOUNTER — Encounter: Payer: Self-pay | Admitting: Family

## 2012-03-14 VITALS — BP 114/70 | Temp 99.0°F | Wt 303.0 lb

## 2012-03-14 DIAGNOSIS — IMO0002 Reserved for concepts with insufficient information to code with codable children: Secondary | ICD-10-CM

## 2012-03-14 DIAGNOSIS — L0291 Cutaneous abscess, unspecified: Secondary | ICD-10-CM

## 2012-03-14 DIAGNOSIS — M25473 Effusion, unspecified ankle: Secondary | ICD-10-CM

## 2012-03-14 DIAGNOSIS — L0390 Cellulitis, unspecified: Secondary | ICD-10-CM

## 2012-03-14 MED ORDER — DOXYCYCLINE HYCLATE 100 MG PO TABS: 100.0000 mg | ORAL_TABLET | Freq: Two times a day (BID) | ORAL | Status: AC

## 2012-03-14 NOTE — Progress Notes (Signed)
Subjective:    Patient ID: Richard [name], male    DOB: 05/22/1956, 57 y.o.   MRN: [id number]  HPI 56 year old white male, nonsmoker, patient of Dr. Arnoldo Morale is in today with a red, swollen left foot x3 days. His symptoms have worsened over the last 24 hours. He denies any drainage or discharge from the flu. Denies injury or known bite. No previous symptoms of this nature. No fever muscle aches or chills   Review of Systems  Constitutional: Negative.   Respiratory: Negative.   Cardiovascular: Negative.   Musculoskeletal: Negative.        No leg pain  Skin:       Red, swollen, left foot  Neurological: Negative.   Hematological: Negative.   Psychiatric/Behavioral: Negative.    Past Medical History  Diagnosis Date  . HYPOGONADISM, MALE 07/24/2008  . HYPERLIPIDEMIA 05/28/2007  . 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 HYPERTENSION 05/28/2007  . Acute maxillary sinusitis 08/09/2009  . ALLERGIC RHINITIS CAUSE UNSPECIFIED 05/21/2009  . COLITIS, ULCERATIVE 05/28/2007  . 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 BACK PAIN, LUMBAR, WITH RADICULOPATHY 03/18/2010  . SLEEP APNEA 12/18/2008  . UNS ADVRS EFF UNS RX MEDICINAL&BIOLOGICAL SBSTNC 03/24/2008  . COLONIC POLYPS, HX OF 05/28/2007  . Internal hemorrhoids     History   Social History  . Marital Status: Single    Spouse Name: N/A    Number of Children: 0  . Years of Education: N/A   Occupational History  . Realtor   .     Social History Main Topics  . Smoking status: Former Research scientist (life sciences)  . Smokeless tobacco: Not on file  . Alcohol Use: 3.5 oz/week    7 drink(s) per week     1 drink per day at most  . Drug Use: No  . Sexually Active: Not on file   Other Topics Concern  . Not on file   Social History Narrative  . No narrative on file    Past Surgical History  Procedure Date  . Nasal sinus surgery     Family History  Problem Relation Age of Onset  . Colon cancer Neg Hx   . Hyperlipidemia Other   . Hypertension Other    . Obesity Other   . Heart disease Father   . Breast cancer Mother     <50  . Ulcerative colitis Cousin     2 cousins paternal side    No Known Allergies  Current Outpatient Prescriptions on File Prior to Visit  Medication Sig Dispense Refill  . AZOR 10-40 MG per tablet TAKE 1 TABLET EVERY DAY  30 tablet  6  . ezetimibe-simvastatin (VYTORIN) 10-20 MG per tablet Take 1 tablet by mouth at bedtime.        . finasteride (PROSCAR) 5 MG tablet TAKE 1/2 TABLET BY MOUTH EVERY OTHER DAY  15 tablet  1  . Multiple Vitamins-Iron (MULTIVITAMIN/IRON) TABS Take 1 tablet by mouth daily.        Marland Kitchen NASONEX 50 MCG/ACT nasal spray PLACE 2 SQUIRTS IN EACH NOSTRIL DAILY  17 g  11  . sulfaSALAzine (SULFAZINE EC) 500 MG EC tablet Take 3 tablets by mouth twice daily.  180 tablet  5  . testosterone cypionate (DEPO-TESTOSTERONE) 200 MG/ML injection Inject 1 mL (200 mg total) into the muscle every 14 (fourteen) days.  10 mL  0  . testosterone cypionate (DEPO-TESTOSTERONE) 200 MG/ML injection Inject 1 mL (200 mg total) into the muscle every 14 (fourteen) days.  10  mL  0  . zolpidem (AMBIEN) 10 MG tablet Take 1 tablet (10 mg total) by mouth at bedtime as needed.  30 tablet  4   Current Facility-Administered Medications on File Prior to Visit  Medication Dose Route Frequency Provider Last Rate Last Dose  . testosterone cypionate (DEPOTESTOTERONE CYPIONATE) injection 200 mg  200 mg Intramuscular Q28 days Ricard Dillon, MD   200 mg at 10/05/11 1203    BP 114/70  Temp 99 F (37.2 C) (Oral)  Wt 303 lb (137.44 kg)chart    Objective:   Physical Exam  Constitutional: He is oriented to person, place, and time. He appears well-developed and well-nourished.  Cardiovascular: Normal rate, regular rhythm and normal heart sounds.   Pulmonary/Chest: Effort normal and breath sounds normal.  Musculoskeletal: Normal range of motion.       No calf tenderness. Popliteal pulses 2 out of 2.  Neurological: He is alert and  oriented to person, place, and time. He displays normal reflexes. He exhibits normal muscle tone. Coordination normal.  Skin: Skin is warm and dry. There is erythema.       Erythema noted to the anterior aspect of the left foot distally and medially. Warm to touch. Pedal pulses are 2 out of 2.   Psychiatric: He has a normal mood and affect.          Assessment & Plan:  Assessment: Cellulitis, left foot swelling  Plan: Doxycycline 100 mg twice a day x10 days. Elevate the foot. Patient call the office if symptoms worsen or persist. Recheck a schedule, and when necessary.

## 2012-03-14 NOTE — Patient Instructions (Addendum)

## 2012-03-18 ENCOUNTER — Encounter: Payer: Self-pay | Admitting: Family

## 2012-03-18 ENCOUNTER — Ambulatory Visit (INDEPENDENT_AMBULATORY_CARE_PROVIDER_SITE_OTHER): Payer: BC Managed Care – PPO | Admitting: Family

## 2012-03-18 VITALS — BP 120/76 | Temp 99.3°F | Wt 305.0 lb

## 2012-03-18 DIAGNOSIS — L02619 Cutaneous abscess of unspecified foot: Secondary | ICD-10-CM

## 2012-03-18 DIAGNOSIS — L03119 Cellulitis of unspecified part of limb: Secondary | ICD-10-CM

## 2012-03-18 DIAGNOSIS — R609 Edema, unspecified: Secondary | ICD-10-CM

## 2012-03-18 MED ORDER — CEFTRIAXONE SODIUM 1 G IJ SOLR
1.0000 g | Freq: Once | INTRAMUSCULAR | Status: AC
  Administered 2012-03-18: 1 g via INTRAMUSCULAR

## 2012-03-18 MED ORDER — CEPHALEXIN 500 MG PO TABS: 500.0000 mg | ORAL_TABLET | Freq: Three times a day (TID) | ORAL | Status: AC

## 2012-03-18 NOTE — Patient Instructions (Signed)

## 2012-03-18 NOTE — Progress Notes (Signed)
Subjective:    Patient ID: Richard [name], male    DOB: 1956-07-31, 56 y.o.   MRN: [id number]  HPI 56 year old WM is in today with persistent cellulitis to the left foot. He is currently on Doxycycline twice a day. He reports it being more red but less swollen. Denies fever or myalgias.    Review of Systems  Constitutional: Negative.   Respiratory: Negative.   Cardiovascular: Negative.   Skin: Positive for color change.       Cellulitis to the left foot that is more red but less swollen.   Neurological: Negative.   Psychiatric/Behavioral: Negative.    Past Medical History  Diagnosis Date  . HYPOGONADISM, MALE 07/24/2008  . HYPERLIPIDEMIA 05/28/2007  . 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 HYPERTENSION 05/28/2007  . Acute maxillary sinusitis 08/09/2009  . ALLERGIC RHINITIS CAUSE UNSPECIFIED 05/21/2009  . COLITIS, ULCERATIVE 05/28/2007  . 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 BACK PAIN, LUMBAR, WITH RADICULOPATHY 03/18/2010  . SLEEP APNEA 12/18/2008  . UNS ADVRS EFF UNS RX MEDICINAL&BIOLOGICAL SBSTNC 03/24/2008  . COLONIC POLYPS, HX OF 05/28/2007  . Internal hemorrhoids     History   Social History  . Marital Status: Single    Spouse Name: N/A    Number of Children: 0  . Years of Education: N/A   Occupational History  . Realtor   .     Social History Main Topics  . Smoking status: Former Research scientist (life sciences)  . Smokeless tobacco: Not on file  . Alcohol Use: 3.5 oz/week    7 drink(s) per week     1 drink per day at most  . Drug Use: No  . Sexually Active: Not on file   Other Topics Concern  . Not on file   Social History Narrative  . No narrative on file    Past Surgical History  Procedure Date  . Nasal sinus surgery     Family History  Problem Relation Age of Onset  . Colon cancer Neg Hx   . Hyperlipidemia Other   . Hypertension Other   . Obesity Other   . Heart disease Father   . Breast cancer Mother     <50  . Ulcerative colitis Cousin     2 cousins  paternal side    No Known Allergies  Current Outpatient Prescriptions on File Prior to Visit  Medication Sig Dispense Refill  . AZOR 10-40 MG per tablet TAKE 1 TABLET EVERY DAY  30 tablet  6  . doxycycline (VIBRA-TABS) 100 MG tablet Take 1 tablet (100 mg total) by mouth 2 (two) times daily.  20 tablet  0  . ezetimibe-simvastatin (VYTORIN) 10-20 MG per tablet Take 1 tablet by mouth at bedtime.        . finasteride (PROSCAR) 5 MG tablet TAKE 1/2 TABLET BY MOUTH EVERY OTHER DAY  15 tablet  1  . Multiple Vitamins-Iron (MULTIVITAMIN/IRON) TABS Take 1 tablet by mouth daily.        Marland Kitchen NASONEX 50 MCG/ACT nasal spray PLACE 2 SQUIRTS IN EACH NOSTRIL DAILY  17 g  11  . sulfaSALAzine (SULFAZINE EC) 500 MG EC tablet Take 3 tablets by mouth twice daily.  180 tablet  5  . zolpidem (AMBIEN) 10 MG tablet Take 1 tablet (10 mg total) by mouth at bedtime as needed.  30 tablet  4  . testosterone cypionate (DEPO-TESTOSTERONE) 200 MG/ML injection Inject 1 mL (200 mg total) into the muscle every 14 (fourteen) days.  10  mL  0  . testosterone cypionate (DEPO-TESTOSTERONE) 200 MG/ML injection Inject 1 mL (200 mg total) into the muscle every 14 (fourteen) days.  10 mL  0   Current Facility-Administered Medications on File Prior to Visit  Medication Dose Route Frequency Provider Last Rate Last Dose  . testosterone cypionate (DEPOTESTOTERONE CYPIONATE) injection 200 mg  200 mg Intramuscular Q28 days Ricard Dillon, MD   200 mg at 10/05/11 1203    BP 120/76  Temp 99.3 F (37.4 C) (Oral)  Wt 305 lb (138.347 kg)chart    Objective:   Physical Exam  Constitutional: He is oriented to person, place, and time. He appears well-developed and well-nourished.  Cardiovascular: Normal rate, regular rhythm and normal heart sounds.   Pulmonary/Chest: Effort normal and breath sounds normal.  Neurological: He is alert and oriented to person, place, and time.  Skin: Skin is warm. There is erythema.       Red cellulitis to the left  foot. Distal aspect of the foot, above the 2nd and 3rd digit. Excluding the toes. Mild swelling.   Psychiatric: He has a normal mood and affect.     Rocephin 1 gram IM x 1     Assessment & Plan:  Assessment: Cellulitis of the left foot-uncontrolled, peripheral edema  Plan: To cover staph, add cephalexin three times a day. Continue doxy. Elevate the foot. Call in 2 days with a status update. If not improved, I will order an ultrasound of the foot.

## 2012-04-03 ENCOUNTER — Other Ambulatory Visit: Payer: Self-pay | Admitting: Gastroenterology

## 2012-04-04 ENCOUNTER — Telehealth: Payer: Self-pay | Admitting: Gastroenterology

## 2012-04-04 DIAGNOSIS — K5190 Ulcerative colitis, unspecified, without complications: Secondary | ICD-10-CM

## 2012-04-04 MED ORDER — SULFASALAZINE 500 MG PO TBEC: DELAYED_RELEASE_TABLET | ORAL | Status: DC

## 2012-04-04 NOTE — Telephone Encounter (Signed)
Pt called back and states he did forget to have labs drawn when he went to see Dr. Arnoldo Morale. Pt agreed to have labs drawn next week and patient is scheduled for his Colonoscopy next month. Orders put in Epic and prescription refill sent to the pharmacy.

## 2012-04-04 NOTE — Telephone Encounter (Signed)
Called patient and left a message stating that he was supposed to have labs drawn June of last year at Dr. Arnoldo Morale office of the management of his medication. Asked patient to return my call after he gets the message.

## 2012-04-07 ENCOUNTER — Other Ambulatory Visit: Payer: Self-pay | Admitting: Internal Medicine

## 2012-04-08 ENCOUNTER — Other Ambulatory Visit: Payer: Self-pay

## 2012-04-08 NOTE — Telephone Encounter (Signed)
Last filled 08/01/11. Ok to refill?

## 2012-04-09 ENCOUNTER — Other Ambulatory Visit: Payer: Self-pay | Admitting: Gastroenterology

## 2012-04-09 MED ORDER — FINASTERIDE 5 MG PO TABS: 5.0000 mg | ORAL_TABLET | Freq: Every day | ORAL | Status: DC

## 2012-04-09 NOTE — Telephone Encounter (Signed)
[facility]

## 2012-04-10 ENCOUNTER — Ambulatory Visit (INDEPENDENT_AMBULATORY_CARE_PROVIDER_SITE_OTHER): Payer: BC Managed Care – PPO | Admitting: Family

## 2012-04-10 ENCOUNTER — Encounter: Payer: Self-pay | Admitting: Family

## 2012-04-10 VITALS — BP 118/78 | HR 101 | Temp 98.2°F | Wt 304.0 lb

## 2012-04-10 DIAGNOSIS — M79672 Pain in left foot: Secondary | ICD-10-CM

## 2012-04-10 DIAGNOSIS — G5760 Lesion of plantar nerve, unspecified lower limb: Secondary | ICD-10-CM

## 2012-04-10 DIAGNOSIS — M79609 Pain in unspecified limb: Secondary | ICD-10-CM

## 2012-04-10 NOTE — Progress Notes (Signed)
  Subjective:    Patient ID: Richard [name], male    DOB: [birth year], 56 y.o.   MRN: [id number]  HPI  Patient is in for a persistent issue. We have referred patient to have an ultrasound. I have spoken with Dr. Arnoldo Morale.   Review of Systems     Objective:   Physical Exam        Assessment & Plan:

## 2012-04-10 NOTE — Patient Instructions (Addendum)
Morton's Neuroma Neuralgia (nerve pain) or neuroma (benign [non-cancerous] nerve tumor) may develop on any interdigital nerve. The interdigital nerves (nerves between digits) of the foot travel beneath and between the metatarsals (long bones of the fore foot) and pass the nerve endings to the toes. The third interdigital is a common place for a small neuroma to form called Morton's neuroma. Another nerve to be affected commonly is the fourth interdigital nerve. This would be in approximately in the area of the base or ball under the bottom of your fourth toe. This condition occurs more commonly in women and is usually on one side. It is usually first noticed by pain radiating (spreading) to the ball of the foot or to the toes. CAUSES The cause of interdigital neuralgia may be from low grade repetitive trauma (damage caused by an accident) as in activities causing a repeated pounding of the foot (running, jumping etc.). It is also caused by improper footwear or recent loss of the fatty padding on the bottom of the foot. TREATMENT  The condition often resolves (goes away) simply with decreasing activity if that is thought to be the cause. Proper shoes are beneficial. Orthotics (special foot support aids) such as a metatarsal bar are often beneficial. This condition usually responds to conservative therapy, however if surgery is necessary it usually brings complete relief. HOME CARE INSTRUCTIONS   Apply ice to the area of soreness for 15 to 20 minutes, 3 to 4 times per day, while awake for the first 2 days. Put ice in a plastic bag and place a towel between the bag of ice and your skin.   Only take over-the-counter or prescription medicines for pain, discomfort, or fever as directed by your caregiver.  MAKE SURE YOU:   Understand these instructions.   Will watch your condition.   Will get help right away if you are not doing well or get worse.  Document Released: 11/27/2000 Document Revised:  08/10/2011 Document Reviewed: 08/21/2005 [facility] Patient Information 2012 [place].

## 2012-04-12 ENCOUNTER — Ambulatory Visit
Admission: RE | Admit: 2012-04-12 | Discharge: 2012-04-12 | Disposition: A | Discharge status: Home / Self Care | Payer: BC Managed Care – PPO | Source: Ambulatory Visit | Attending: Family | Admitting: Family

## 2012-04-12 ENCOUNTER — Other Ambulatory Visit: Payer: Self-pay | Admitting: Family

## 2012-04-12 ENCOUNTER — Ambulatory Visit (HOSPITAL_COMMUNITY)
Admission: RE | Admit: 2012-04-12 | Discharge: 2012-04-12 | Disposition: A | Discharge status: Home / Self Care | Payer: BC Managed Care – PPO | Source: Ambulatory Visit | Attending: Internal Medicine | Admitting: Internal Medicine

## 2012-04-12 ENCOUNTER — Other Ambulatory Visit: Payer: Self-pay

## 2012-04-12 DIAGNOSIS — M79672 Pain in left foot: Secondary | ICD-10-CM

## 2012-04-12 DIAGNOSIS — X58XXXA Exposure to other specified factors, initial encounter: Secondary | ICD-10-CM | POA: Insufficient documentation

## 2012-04-12 DIAGNOSIS — S92309A Fracture of unspecified metatarsal bone(s), unspecified foot, initial encounter for closed fracture: Secondary | ICD-10-CM | POA: Insufficient documentation

## 2012-04-12 DIAGNOSIS — R2240 Localized swelling, mass and lump, unspecified lower limb: Secondary | ICD-10-CM

## 2012-04-12 DIAGNOSIS — M7989 Other specified soft tissue disorders: Secondary | ICD-10-CM | POA: Insufficient documentation

## 2012-04-12 LAB — CREATININE, SERUM
Creatinine, Ser: 0.82 mg/dL (ref 0.50–1.35)
GFR calc Af Amer: >90 mL/min (ref >90)

## 2012-04-12 MED ORDER — GADOBENATE DIMEGLUMINE 529 MG/ML IV SOLN
20.0000 mL | Freq: Once | INTRAVENOUS | Status: AC | PRN
  Administered 2012-04-12: 20 mL via INTRAVENOUS

## 2012-04-15 ENCOUNTER — Other Ambulatory Visit: Payer: Self-pay | Admitting: Family

## 2012-04-15 DIAGNOSIS — S92909A Unspecified fracture of unspecified foot, initial encounter for closed fracture: Secondary | ICD-10-CM

## 2012-04-17 ENCOUNTER — Other Ambulatory Visit: Payer: Self-pay | Admitting: Internal Medicine

## 2012-04-17 DIAGNOSIS — S92909A Unspecified fracture of unspecified foot, initial encounter for closed fracture: Secondary | ICD-10-CM

## 2012-04-18 ENCOUNTER — Other Ambulatory Visit (INDEPENDENT_AMBULATORY_CARE_PROVIDER_SITE_OTHER): Payer: BC Managed Care – PPO

## 2012-04-18 DIAGNOSIS — G629 Polyneuropathy, unspecified: Secondary | ICD-10-CM

## 2012-04-18 DIAGNOSIS — G589 Mononeuropathy, unspecified: Secondary | ICD-10-CM

## 2012-04-18 DIAGNOSIS — E291 Testicular hypofunction: Secondary | ICD-10-CM

## 2012-04-18 LAB — HEMOGLOBIN A1C: Hgb A1c MFr Bld: 5.2 % (ref 4.6–6.5)

## 2012-04-18 MED ORDER — TESTOSTERONE CYPIONATE 200 MG/ML IM SOLN
200.0000 mg | INTRAMUSCULAR | Status: AC
  Administered 2012-04-18: 200 mg via INTRAMUSCULAR

## 2012-04-18 NOTE — Addendum Note (Signed)
Addended by: Allyne Gee on: 04/18/2012 01:09 PM   Modules accepted: Orders

## 2012-04-19 ENCOUNTER — Ambulatory Visit (INDEPENDENT_AMBULATORY_CARE_PROVIDER_SITE_OTHER)
Admission: RE | Admit: 2012-04-19 | Discharge: 2012-04-19 | Disposition: A | Discharge status: Home / Self Care | Payer: BC Managed Care – PPO | Source: Ambulatory Visit

## 2012-04-19 DIAGNOSIS — S92909A Unspecified fracture of unspecified foot, initial encounter for closed fracture: Secondary | ICD-10-CM

## 2012-04-23 ENCOUNTER — Other Ambulatory Visit: Payer: Self-pay | Admitting: Internal Medicine

## 2012-05-02 ENCOUNTER — Encounter: Payer: Self-pay | Admitting: Gastroenterology

## 2012-05-02 ENCOUNTER — Ambulatory Visit (AMBULATORY_SURGERY_CENTER): Payer: BC Managed Care – PPO | Admitting: *Deleted

## 2012-05-02 VITALS — Ht 73.0 in | Wt 303.6 lb

## 2012-05-02 DIAGNOSIS — Z860100 Personal history of colon polyps, unspecified: Secondary | ICD-10-CM

## 2012-05-02 DIAGNOSIS — K5190 Ulcerative colitis, unspecified, without complications: Secondary | ICD-10-CM

## 2012-05-02 MED ORDER — PEG-KCL-NACL-NASULF-NA ASC-C 100 G PO SOLR: ORAL | Status: DC

## 2012-05-02 NOTE — Progress Notes (Signed)
No allergies to egg or soy products

## 2012-05-15 ENCOUNTER — Other Ambulatory Visit: Payer: Self-pay | Admitting: Physician Assistant

## 2012-05-20 ENCOUNTER — Other Ambulatory Visit: Payer: Self-pay | Admitting: Gastroenterology

## 2012-05-20 NOTE — Telephone Encounter (Signed)
Needs to get labs done!!

## 2012-05-23 ENCOUNTER — Encounter: Payer: Self-pay | Admitting: Gastroenterology

## 2012-05-23 ENCOUNTER — Ambulatory Visit (AMBULATORY_SURGERY_CENTER): Payer: BC Managed Care – PPO | Admitting: Gastroenterology

## 2012-05-23 VITALS — BP 130/76 | HR 76 | Temp 98.2°F | Resp 18 | Ht 73.0 in | Wt 303.0 lb

## 2012-05-23 DIAGNOSIS — K621 Rectal polyp: Secondary | ICD-10-CM

## 2012-05-23 DIAGNOSIS — Z1211 Encounter for screening for malignant neoplasm of colon: Secondary | ICD-10-CM

## 2012-05-23 DIAGNOSIS — D126 Benign neoplasm of colon, unspecified: Secondary | ICD-10-CM

## 2012-05-23 DIAGNOSIS — Z860100 Personal history of colon polyps, unspecified: Secondary | ICD-10-CM

## 2012-05-23 DIAGNOSIS — K620 Anal polyp: Secondary | ICD-10-CM

## 2012-05-23 DIAGNOSIS — K5190 Ulcerative colitis, unspecified, without complications: Secondary | ICD-10-CM

## 2012-05-23 DIAGNOSIS — K5289 Other specified noninfective gastroenteritis and colitis: Secondary | ICD-10-CM

## 2012-05-23 MED ORDER — SODIUM CHLORIDE 0.9 % IV SOLN: 500.0000 mL | INTRAVENOUS | Status: DC

## 2012-05-23 NOTE — Patient Instructions (Addendum)
Discharge instructions given with verbal understanding. Handouts on polyps and hemorrhoids given. Resume previous medications. YOU HAD AN ENDOSCOPIC PROCEDURE TODAY AT [facility]: Refer to the procedure report that was given to you for any specific questions about what was found during the examination.  If the procedure report does not answer your questions, please call your gastroenterologist to clarify.  If you requested that your care partner not be given the details of your procedure findings, then the procedure report has been included in a sealed envelope for you to review at your convenience later.  YOU SHOULD EXPECT: Some feelings of bloating in the abdomen. Passage of more gas than usual.  Walking can help get rid of the air that was put into your GI tract during the procedure and reduce the bloating. If you had a lower endoscopy (such as a colonoscopy or flexible sigmoidoscopy) you may notice spotting of blood in your stool or on the toilet paper. If you underwent a bowel prep for your procedure, then you may not have a normal bowel movement for a few days.  DIET: Your first meal following the procedure should be a light meal and then it is ok to progress to your normal diet.  A half-sandwich or bowl of soup is an example of a good first meal.  Heavy or fried foods are harder to digest and may make you feel nauseous or bloated.  Likewise meals heavy in dairy and vegetables can cause extra gas to form and this can also increase the bloating.  Drink plenty of fluids but you should avoid alcoholic beverages for 24 hours.  ACTIVITY: Your care partner should take you home directly after the procedure.  You should plan to take it easy, moving slowly for the rest of the day.  You can resume normal activity the day after the procedure however you should NOT DRIVE or use heavy machinery for 24 hours (because of the sedation medicines used during the test).    SYMPTOMS TO REPORT  IMMEDIATELY: A gastroenterologist can be reached at any hour.  During normal business hours, 8:30 AM to 5:00 PM Monday through Friday, call [phone].  After hours and on weekends, please call the GI answering service at [phone] who will take a message and have the physician on call contact you.   Following lower endoscopy (colonoscopy or flexible sigmoidoscopy):  Excessive amounts of blood in the stool  Significant tenderness or worsening of abdominal pains  Swelling of the abdomen that is new, acute  Fever of 100F or higher FOLLOW UP: If any biopsies were taken you will be contacted by phone or by letter within the next 1-3 weeks.  Call your gastroenterologist if you have not heard about the biopsies in 3 weeks.  Our staff will call the home number listed on your records the next business day following your procedure to check on you and address any questions or concerns that you may have at that time regarding the information given to you following your procedure. This is a courtesy call and so if there is no answer at the home number and we have not heard from you through the emergency physician on call, we will assume that you have returned to your regular daily activities without incident.  SIGNATURES/CONFIDENTIALITY: You and/or your care partner have signed paperwork which will be entered into your electronic medical record.  These signatures attest to the fact that that the information above on your After Visit Summary  has been reviewed and is understood.  Full responsibility of the confidentiality of this discharge information lies with you and/or your care-partner.

## 2012-05-23 NOTE — Op Note (Signed)
[place]  [address] [place], [address]   COLONOSCOPY PROCEDURE REPORT  PATIENT: Richard, [name]  MR#: [id number] BIRTHDATE: Jun 09, 1956 , 4  yrs. old GENDER: Male ENDOSCOPIST: Ladene Artist, MD, [facility]  PROCEDURE DATE:  05/23/2012 PROCEDURE:   Colonoscopy with biopsy and with snare polypectomy ASA CLASS:   Class II INDICATIONS: high risk patient with previously diagnosed UC. MEDICATIONS: MAC sedation, administered by CRNA and propofol (Diprivan) 1108m IV DESCRIPTION OF PROCEDURE:   After the risks benefits and alternatives of the procedure were thoroughly explained, informed consent was obtained.  A digital rectal exam revealed no abnormalities of the rectum.   The LB CF-[id number] endoscope was introduced through the anus and advanced to the cecum, which was identified by both the appendix and ileocecal valve. No adverse events experienced.   The quality of the prep was good, using MoviPrep  The instrument was then slowly withdrawn as the colon was fully examined.   COLON FINDINGS: Mild patchy erythema was found in the transverse colon.  Multiple biopsies of the area were performed using cold forceps.   A small area with 5 or 6 pseudopolyps was found in the transverse colon.  Five sessile polyps measuring 5-6 mm in size were found in the rectum.  A polypectomy was performed with a cold snare.  The resection was complete and the polyp tissue was completely retrieved.  The colon was otherwise normal. Random biopsies were performed using cold forceps throughout the colon. There was no diverticulosis, inflammation, polyps or cancers unless previously stated.  Retroflexed views revealed small internal hemorrhoids. The time to cecum=2 minutes 32 seconds.  Withdrawal time=12 minutes 0 seconds.  The scope was withdrawn and the procedure completed. COMPLICATIONS: There were no complications.  ENDOSCOPIC IMPRESSION: 1.   Mild erythema in the  transverse colon 2.   Small area of pseudopolyps in the transverse colon 3.   Five sessile polyps in the rectum; polypectomy with a cold snare 4.   Small internal hemorrhoids  RECOMMENDATIONS: 1.  await pathology results 2.  repeat Colonoscopy in 2 years.  eSigned:  MLadene Artist MD, [facility]09/19/2013 8:58 AM

## 2012-05-23 NOTE — Progress Notes (Signed)
Patient did not experience any of the following events: a burn prior to discharge; a fall within the facility; wrong site/side/patient/procedure/implant event; or a hospital transfer or hospital admission upon discharge from the facility. (G8907) Patient did not have preoperative order for IV antibiotic SSI prophylaxis. (G8918)  

## 2012-05-24 ENCOUNTER — Telehealth: Payer: Self-pay | Admitting: *Deleted

## 2012-05-24 NOTE — Telephone Encounter (Signed)
  Follow up Call-  Call back number 05/23/2012  Post procedure Call Back phone  # [phone]  Permission to leave phone message Yes     Left message to call if necessary.

## 2012-05-28 ENCOUNTER — Encounter: Payer: Self-pay | Admitting: Gastroenterology

## 2012-05-30 ENCOUNTER — Ambulatory Visit (INDEPENDENT_AMBULATORY_CARE_PROVIDER_SITE_OTHER): Payer: BC Managed Care – PPO | Admitting: Internal Medicine

## 2012-05-30 ENCOUNTER — Other Ambulatory Visit: Payer: Self-pay | Admitting: Internal Medicine

## 2012-05-30 DIAGNOSIS — E291 Testicular hypofunction: Secondary | ICD-10-CM

## 2012-05-30 DIAGNOSIS — Z23 Encounter for immunization: Secondary | ICD-10-CM

## 2012-05-30 MED ORDER — TESTOSTERONE CYPIONATE 200 MG/ML IM SOLN
200.0000 mg | INTRAMUSCULAR | Status: AC
  Administered 2012-05-30: 200 mg via INTRAMUSCULAR

## 2012-06-12 ENCOUNTER — Other Ambulatory Visit (INDEPENDENT_AMBULATORY_CARE_PROVIDER_SITE_OTHER): Payer: BC Managed Care – PPO

## 2012-06-12 DIAGNOSIS — Z0000 Encounter for general adult medical examination without abnormal findings: Secondary | ICD-10-CM

## 2012-06-12 LAB — CBC WITH DIFFERENTIAL/PLATELET
Basophils Relative: 0.2 % (ref 0.0–3.0)
Eosinophils Absolute: 0.2 10*3/uL (ref 0.0–0.7)
Eosinophils Relative: 3.6 % (ref 0.0–5.0)
Hemoglobin: 14.7 g/dL (ref 13.0–17.0)
Lymphocytes Relative: 23.5 % (ref 12.0–46.0)
MCHC: 33.1 g/dL (ref 30.0–36.0)
Monocytes Relative: 7.4 % (ref 3.0–12.0)
Neutro Abs: 4.5 10*3/uL (ref 1.4–7.7)
Neutrophils Relative %: 65.3 % (ref 43.0–77.0)
RBC: 4.85 Mil/uL (ref 4.22–5.81)
WBC: 6.9 10*3/uL (ref 4.5–10.5)

## 2012-06-12 LAB — POCT URINALYSIS DIPSTICK
Bilirubin, UA: NEGATIVE
Ketones, UA: NEGATIVE
Leukocytes, UA: NEGATIVE
Spec Grav, UA: 1.015
pH, UA: 5.5

## 2012-06-12 LAB — LIPID PANEL
Cholesterol: 182 mg/dL (ref 0–200)
HDL: 36.9 mg/dL — ABNORMAL LOW (ref >39.00)
LDL Cholesterol: 118 mg/dL — ABNORMAL HIGH (ref 0–99)
VLDL: 27 mg/dL (ref 0.0–40.0)

## 2012-06-12 LAB — HEPATIC FUNCTION PANEL
AST: 26 U/L (ref 0–37)
Alkaline Phosphatase: 59 U/L (ref 39–117)
Bilirubin, Direct: 0.2 mg/dL (ref 0.0–0.3)
Total Protein: 7.1 g/dL (ref 6.0–8.3)

## 2012-06-12 LAB — BASIC METABOLIC PANEL
Calcium: 9.1 mg/dL (ref 8.4–10.5)
Creatinine, Ser: 0.8 mg/dL (ref 0.4–1.5)
GFR: 105.37 mL/min (ref >60.00)
Sodium: 137 mEq/L (ref 135–145)

## 2012-06-16 ENCOUNTER — Other Ambulatory Visit: Payer: Self-pay | Admitting: Gastroenterology

## 2012-06-18 ENCOUNTER — Ambulatory Visit (INDEPENDENT_AMBULATORY_CARE_PROVIDER_SITE_OTHER): Payer: BC Managed Care – PPO | Admitting: Internal Medicine

## 2012-06-18 VITALS — BP 130/80 | HR 84 | Temp 98.2°F | Resp 18 | Ht 73.0 in | Wt 294.0 lb

## 2012-06-18 DIAGNOSIS — Z0000 Encounter for general adult medical examination without abnormal findings: Secondary | ICD-10-CM

## 2012-06-18 DIAGNOSIS — E785 Hyperlipidemia, unspecified: Secondary | ICD-10-CM

## 2012-06-18 MED ORDER — SULFASALAZINE 500 MG PO TBEC: 1500.0000 mg | DELAYED_RELEASE_TABLET | Freq: Two times a day (BID) | ORAL | Status: DC

## 2012-06-18 NOTE — Progress Notes (Signed)
Subjective:    Patient ID: Richard [name], male    DOB: 05-Mar-1956, 56 y.o.   MRN: [id number]  HPI On the paleo system Weight loss CPX Hx of obesity, lipids, HTN and asthma    Review of Systems  Constitutional: Negative for fever and fatigue.  HENT: Negative for hearing loss, congestion, neck pain and postnasal drip.   Eyes: Negative for discharge, redness and visual disturbance.  Respiratory: Negative for cough, shortness of breath and wheezing.   Cardiovascular: Negative for leg swelling.  Gastrointestinal: Negative for abdominal pain, constipation and abdominal distention.  Genitourinary: Negative for urgency and frequency.  Musculoskeletal: Negative for joint swelling and arthralgias.  Skin: Negative for color change and rash.  Neurological: Negative for weakness and light-headedness.  Hematological: Negative for adenopathy.  Psychiatric/Behavioral: Negative for behavioral problems.       Past Medical History  Diagnosis Date  . HYPOGONADISM, MALE 07/24/2008  . HYPERLIPIDEMIA 05/28/2007  . 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 HYPERTENSION 05/28/2007  . Acute maxillary sinusitis 08/09/2009  . ALLERGIC RHINITIS CAUSE UNSPECIFIED 05/21/2009  . COLITIS, ULCERATIVE 05/28/2007  . 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 BACK PAIN, LUMBAR, WITH RADICULOPATHY 03/18/2010  . SLEEP APNEA 12/18/2008    wears CPAP  . UNS ADVRS EFF UNS RX MEDICINAL&BIOLOGICAL SBSTNC 03/24/2008  . COLONIC POLYPS, HX OF 05/28/2007  . Internal hemorrhoids   . Glaucoma   . Injury of left foot     History   Social History  . Marital Status: Single    Spouse Name: N/A    Number of Children: 0  . Years of Education: N/A   Occupational History  . Realtor   .     Social History Main Topics  . Smoking status: Former Research scientist (life sciences)  . Smokeless tobacco: Never Used  . Alcohol Use: 3.5 oz/week    7 drink(s) per week     1 drink per day at most  . Drug Use: No  . Sexually Active: Not on file    Other Topics Concern  . Not on file   Social History Narrative  . No narrative on file    Past Surgical History  Procedure Date  . Nasal sinus surgery   . Colonoscopy   . Polypectomy     Family History  Problem Relation Age of Onset  . Colon cancer Neg Hx   . Esophageal cancer Neg Hx   . Stomach cancer Neg Hx   . Rectal cancer Neg Hx   . Hyperlipidemia Other   . Hypertension Other   . Obesity Other   . Heart disease Father   . Breast cancer Mother     <50  . Ulcerative colitis Cousin     2 cousins paternal side    No Known Allergies  Current Outpatient Prescriptions on File Prior to Visit  Medication Sig Dispense Refill  . AZOR 10-40 MG per tablet TAKE 1 TABLET EVERY DAY  30 tablet  6  . ezetimibe-simvastatin (VYTORIN) 10-20 MG per tablet Take 1 tablet by mouth at bedtime.        . finasteride (PROSCAR) 5 MG tablet Take 1 tablet (5 mg total) by mouth daily.  30 tablet  11  . Multiple Vitamins-Iron (MULTIVITAMIN/IRON) TABS Take 1 tablet by mouth daily.        Marland Kitchen NASONEX 50 MCG/ACT nasal spray PLACE 2 SQUIRTS IN EACH NOSTRIL DAILY  17 g  11  . zolpidem (AMBIEN) 10 MG tablet Take 1 tablet (10 mg  total) by mouth at bedtime as needed.  30 tablet  4  . DISCONTD: SULFAZINE EC 500 MG EC tablet TAKE 3 TABLETS BY MOUTH TWICE DAILY.  180 tablet  0  . DISCONTD: SULFAZINE EC 500 MG EC tablet TAKE 3 TABLETS BY MOUTH TWICE DAILY.  180 tablet  5  . DISCONTD: testosterone cypionate (DEPO-TESTOSTERONE) 200 MG/ML injection Inject 1 mL (200 mg total) into the muscle every 14 (fourteen) days.  10 mL  0  . DISCONTD: testosterone cypionate (DEPO-TESTOSTERONE) 200 MG/ML injection Inject 1 mL (200 mg total) into the muscle every 14 (fourteen) days.  10 mL  0   Current Facility-Administered Medications on File Prior to Visit  Medication Dose Route Frequency Provider Last Rate Last Dose  . testosterone cypionate (DEPOTESTOTERONE CYPIONATE) injection 200 mg  200 mg Intramuscular Q28 days Ricard Dillon, MD   200 mg at 10/05/11 1203    BP 130/80  Pulse 84  Temp 98.2 F (36.8 C)  Resp 18  Ht 6' 1"  (1.854 m)  Wt 294 lb (133.358 kg)  BMI 38.79 kg/m2      Objective:   Physical Exam  Nursing note and vitals reviewed. Constitutional: He appears well-developed and well-nourished.  HENT:  Head: Normocephalic and atraumatic.  Eyes: Conjunctivae normal are normal. Pupils are equal, round, and reactive to light.  Neck: Normal range of motion. Neck supple.  Cardiovascular: Normal rate and regular rhythm.   Pulmonary/Chest: Effort normal and breath sounds normal.  Abdominal: Soft. Bowel sounds are normal.  Musculoskeletal: Normal range of motion.  Neurological: He is alert.  Skin: Skin is warm and dry.          Assessment & Plan:   Patient presents for yearly preventative medicine examination.   all immunizations and health maintenance protocols were reviewed with the patient and they are up to date with these protocols.   screening laboratory values were reviewed with the patient including screening of hyperlipidemia PSA renal function and hepatic function.   There medications past medical history social history problem list and allergies were reviewed in detail.   Goals were established with regard to weight loss exercise diet in compliance with medications

## 2012-06-19 ENCOUNTER — Encounter: Payer: BC Managed Care – PPO | Admitting: Internal Medicine

## 2012-07-04 ENCOUNTER — Ambulatory Visit (INDEPENDENT_AMBULATORY_CARE_PROVIDER_SITE_OTHER): Payer: BC Managed Care – PPO | Admitting: Internal Medicine

## 2012-07-04 DIAGNOSIS — D519 Vitamin B12 deficiency anemia, unspecified: Secondary | ICD-10-CM

## 2012-07-04 DIAGNOSIS — E291 Testicular hypofunction: Secondary | ICD-10-CM

## 2012-07-04 DIAGNOSIS — D518 Other vitamin B12 deficiency anemias: Secondary | ICD-10-CM

## 2012-07-04 DIAGNOSIS — E785 Hyperlipidemia, unspecified: Secondary | ICD-10-CM

## 2012-07-04 MED ORDER — CYANOCOBALAMIN 1000 MCG/ML IJ SOLN
1500.0000 ug | INTRAMUSCULAR | Status: AC
  Administered 2012-07-04: 1500 ug via INTRAMUSCULAR

## 2012-07-04 MED ORDER — TESTOSTERONE CYPIONATE 200 MG/ML IM SOLN: 200.0000 mg | INTRAMUSCULAR | Status: DC

## 2012-07-22 ENCOUNTER — Other Ambulatory Visit: Payer: Self-pay | Admitting: Internal Medicine

## 2012-08-16 ENCOUNTER — Ambulatory Visit (INDEPENDENT_AMBULATORY_CARE_PROVIDER_SITE_OTHER): Payer: BC Managed Care – PPO | Admitting: Internal Medicine

## 2012-08-16 DIAGNOSIS — E291 Testicular hypofunction: Secondary | ICD-10-CM

## 2012-08-16 DIAGNOSIS — R7989 Other specified abnormal findings of blood chemistry: Secondary | ICD-10-CM

## 2012-08-16 MED ORDER — TESTOSTERONE CYPIONATE 200 MG/ML IM SOLN
200.0000 mg | INTRAMUSCULAR | Status: AC
  Administered 2012-08-16: 200 mg via INTRAMUSCULAR

## 2012-09-13 ENCOUNTER — Ambulatory Visit (INDEPENDENT_AMBULATORY_CARE_PROVIDER_SITE_OTHER): Payer: BC Managed Care – PPO | Admitting: Internal Medicine

## 2012-09-13 DIAGNOSIS — E291 Testicular hypofunction: Secondary | ICD-10-CM

## 2012-09-13 MED ORDER — TESTOSTERONE CYPIONATE 200 MG/ML IM SOLN
200.0000 mg | INTRAMUSCULAR | Status: AC
  Administered 2012-09-13: 200 mg via INTRAMUSCULAR

## 2012-09-17 ENCOUNTER — Encounter: Payer: Self-pay | Admitting: Internal Medicine

## 2012-09-17 NOTE — Patient Instructions (Signed)
The patient is instructed to continue all medications as prescribed. Schedule followup with check out clerk upon leaving the clinic  

## 2012-10-09 ENCOUNTER — Ambulatory Visit (INDEPENDENT_AMBULATORY_CARE_PROVIDER_SITE_OTHER): Payer: BC Managed Care – PPO | Admitting: Internal Medicine

## 2012-10-09 DIAGNOSIS — E349 Endocrine disorder, unspecified: Secondary | ICD-10-CM

## 2012-10-09 DIAGNOSIS — E291 Testicular hypofunction: Secondary | ICD-10-CM

## 2012-10-09 MED ORDER — TESTOSTERONE CYPIONATE 200 MG/ML IM SOLN
200.0000 mg | INTRAMUSCULAR | Status: AC
  Administered 2012-10-09: 200 mg via INTRAMUSCULAR

## 2012-11-20 ENCOUNTER — Ambulatory Visit (INDEPENDENT_AMBULATORY_CARE_PROVIDER_SITE_OTHER): Payer: BC Managed Care – PPO | Admitting: Internal Medicine

## 2012-11-20 DIAGNOSIS — E291 Testicular hypofunction: Secondary | ICD-10-CM

## 2012-11-20 MED ORDER — TESTOSTERONE CYPIONATE 200 MG/ML IM SOLN
100.0000 mg | Freq: Once | INTRAMUSCULAR | Status: AC
  Administered 2012-11-20: 100 mg via INTRAMUSCULAR

## 2012-11-27 ENCOUNTER — Other Ambulatory Visit: Payer: Self-pay | Admitting: Internal Medicine

## 2012-12-24 ENCOUNTER — Other Ambulatory Visit: Payer: Self-pay | Admitting: Internal Medicine

## 2012-12-30 ENCOUNTER — Ambulatory Visit: Payer: BC Managed Care – PPO | Admitting: Internal Medicine

## 2013-01-02 ENCOUNTER — Ambulatory Visit (INDEPENDENT_AMBULATORY_CARE_PROVIDER_SITE_OTHER): Payer: BC Managed Care – PPO | Admitting: Internal Medicine

## 2013-01-02 DIAGNOSIS — E291 Testicular hypofunction: Secondary | ICD-10-CM

## 2013-01-02 MED ORDER — TESTOSTERONE CYPIONATE 200 MG/ML IM SOLN: 200.0000 mg | INTRAMUSCULAR | Status: DC

## 2013-01-02 MED ORDER — TESTOSTERONE CYPIONATE 200 MG/ML IM SOLN
200.0000 mg | Freq: Once | INTRAMUSCULAR | Status: AC
  Administered 2013-01-02: 200 mg via INTRAMUSCULAR

## 2013-01-31 ENCOUNTER — Ambulatory Visit (INDEPENDENT_AMBULATORY_CARE_PROVIDER_SITE_OTHER): Payer: BC Managed Care – PPO | Admitting: Internal Medicine

## 2013-01-31 DIAGNOSIS — E291 Testicular hypofunction: Secondary | ICD-10-CM

## 2013-01-31 MED ORDER — TESTOSTERONE CYPIONATE 100 MG/ML IM SOLN
100.0000 mg | Freq: Once | INTRAMUSCULAR | Status: AC
  Administered 2013-01-31: 100 mg via INTRAMUSCULAR

## 2013-02-04 ENCOUNTER — Other Ambulatory Visit: Payer: Self-pay | Admitting: *Deleted

## 2013-02-04 MED ORDER — ZOLPIDEM TARTRATE 10 MG PO TABS: 10.0000 mg | ORAL_TABLET | Freq: Every evening | ORAL | Status: DC | PRN

## 2013-03-02 ENCOUNTER — Other Ambulatory Visit: Payer: Self-pay | Admitting: Internal Medicine

## 2013-03-06 ENCOUNTER — Telehealth: Payer: Self-pay | Admitting: Internal Medicine

## 2013-03-06 NOTE — Telephone Encounter (Signed)
Pt's prior auth for Azor has been denied. List of alternatives:  Candesartan Cilexetil-HCTZ  Valsartan-HCTZ  Losartan Potassium-HCTZ  Irbesartan-HCTZ  Telmisartan-HCTZ   [facility] [address]

## 2013-03-10 ENCOUNTER — Ambulatory Visit (INDEPENDENT_AMBULATORY_CARE_PROVIDER_SITE_OTHER): Payer: BC Managed Care – PPO | Admitting: Internal Medicine

## 2013-03-10 DIAGNOSIS — E291 Testicular hypofunction: Secondary | ICD-10-CM

## 2013-03-10 MED ORDER — TESTOSTERONE CYPIONATE 200 MG/ML IM SOLN
200.0000 mg | INTRAMUSCULAR | Status: AC
  Administered 2013-03-10: 200 mg via INTRAMUSCULAR

## 2013-04-10 ENCOUNTER — Ambulatory Visit (INDEPENDENT_AMBULATORY_CARE_PROVIDER_SITE_OTHER): Payer: BC Managed Care – PPO | Admitting: *Deleted

## 2013-04-10 DIAGNOSIS — E291 Testicular hypofunction: Secondary | ICD-10-CM

## 2013-04-10 MED ORDER — TESTOSTERONE CYPIONATE 100 MG/ML IM SOLN
200.0000 mg | INTRAMUSCULAR | Status: AC
  Administered 2013-04-10: 200 mg via INTRAMUSCULAR

## 2013-04-20 ENCOUNTER — Other Ambulatory Visit: Payer: Self-pay | Admitting: Internal Medicine

## 2013-04-30 ENCOUNTER — Other Ambulatory Visit: Payer: Self-pay | Admitting: *Deleted

## 2013-04-30 ENCOUNTER — Ambulatory Visit (INDEPENDENT_AMBULATORY_CARE_PROVIDER_SITE_OTHER): Payer: BC Managed Care – PPO | Admitting: Internal Medicine

## 2013-04-30 DIAGNOSIS — E291 Testicular hypofunction: Secondary | ICD-10-CM

## 2013-04-30 DIAGNOSIS — Z23 Encounter for immunization: Secondary | ICD-10-CM

## 2013-04-30 MED ORDER — TESTOSTERONE CYPIONATE 200 MG/ML IM SOLN
200.0000 mg | INTRAMUSCULAR | Status: AC
  Administered 2013-04-30: 200 mg via INTRAMUSCULAR

## 2013-04-30 NOTE — Addendum Note (Signed)
Addended by: Allyne Gee on: 04/30/2013 11:47 AM   Modules accepted: Orders

## 2013-06-18 ENCOUNTER — Ambulatory Visit (INDEPENDENT_AMBULATORY_CARE_PROVIDER_SITE_OTHER): Payer: BC Managed Care – PPO | Admitting: Internal Medicine

## 2013-06-18 DIAGNOSIS — E291 Testicular hypofunction: Secondary | ICD-10-CM

## 2013-06-18 DIAGNOSIS — E349 Endocrine disorder, unspecified: Secondary | ICD-10-CM

## 2013-06-18 MED ORDER — TESTOSTERONE CYPIONATE 200 MG/ML IM SOLN
200.0000 mg | Freq: Once | INTRAMUSCULAR | Status: AC
  Administered 2013-06-18: 200 mg via INTRAMUSCULAR

## 2013-06-19 ENCOUNTER — Other Ambulatory Visit: Payer: Self-pay | Admitting: Internal Medicine

## 2013-07-22 ENCOUNTER — Ambulatory Visit (INDEPENDENT_AMBULATORY_CARE_PROVIDER_SITE_OTHER): Payer: BC Managed Care – PPO | Admitting: Internal Medicine

## 2013-07-22 DIAGNOSIS — E291 Testicular hypofunction: Secondary | ICD-10-CM

## 2013-07-22 DIAGNOSIS — E349 Endocrine disorder, unspecified: Secondary | ICD-10-CM

## 2013-07-22 MED ORDER — TESTOSTERONE CYPIONATE 200 MG/ML IM SOLN
200.0000 mg | INTRAMUSCULAR | Status: AC
  Administered 2013-07-22: 200 mg via INTRAMUSCULAR

## 2013-08-21 ENCOUNTER — Ambulatory Visit (INDEPENDENT_AMBULATORY_CARE_PROVIDER_SITE_OTHER): Payer: BC Managed Care – PPO | Admitting: Internal Medicine

## 2013-08-21 DIAGNOSIS — E291 Testicular hypofunction: Secondary | ICD-10-CM

## 2013-08-21 DIAGNOSIS — E349 Endocrine disorder, unspecified: Secondary | ICD-10-CM

## 2013-08-21 MED ORDER — TESTOSTERONE CYPIONATE 200 MG/ML IM SOLN
200.0000 mg | Freq: Once | INTRAMUSCULAR | Status: AC
  Administered 2013-08-21: 200 mg via INTRAMUSCULAR

## 2013-09-03 ENCOUNTER — Other Ambulatory Visit: Payer: Self-pay | Admitting: Internal Medicine

## 2013-10-16 ENCOUNTER — Ambulatory Visit (INDEPENDENT_AMBULATORY_CARE_PROVIDER_SITE_OTHER): Payer: BC Managed Care – PPO | Admitting: *Deleted

## 2013-10-16 DIAGNOSIS — E349 Endocrine disorder, unspecified: Secondary | ICD-10-CM

## 2013-10-16 DIAGNOSIS — E291 Testicular hypofunction: Secondary | ICD-10-CM

## 2013-10-16 MED ORDER — TESTOSTERONE CYPIONATE 100 MG/ML IM SOLN
200.0000 mg | INTRAMUSCULAR | Status: AC
  Administered 2013-10-16: 200 mg via INTRAMUSCULAR

## 2013-12-03 ENCOUNTER — Ambulatory Visit (INDEPENDENT_AMBULATORY_CARE_PROVIDER_SITE_OTHER): Payer: BC Managed Care – PPO | Admitting: Internal Medicine

## 2013-12-03 DIAGNOSIS — E349 Endocrine disorder, unspecified: Secondary | ICD-10-CM

## 2013-12-03 DIAGNOSIS — E291 Testicular hypofunction: Secondary | ICD-10-CM

## 2013-12-03 MED ORDER — TESTOSTERONE CYPIONATE 200 MG/ML IM SOLN
200.0000 mg | Freq: Once | INTRAMUSCULAR | Status: AC
  Administered 2013-12-03: 200 mg via INTRAMUSCULAR

## 2013-12-30 ENCOUNTER — Encounter: Payer: Self-pay | Admitting: Internal Medicine

## 2013-12-30 DIAGNOSIS — I10 Essential (primary) hypertension: Secondary | ICD-10-CM

## 2013-12-31 MED ORDER — AMLODIPINE BESY-BENAZEPRIL HCL 10-40 MG PO CAPS: 1.0000 | ORAL_CAPSULE | Freq: Every day | ORAL | Status: DC

## 2014-01-26 ENCOUNTER — Other Ambulatory Visit: Payer: Self-pay | Admitting: Internal Medicine

## 2014-02-06 ENCOUNTER — Ambulatory Visit (INDEPENDENT_AMBULATORY_CARE_PROVIDER_SITE_OTHER): Payer: BC Managed Care – PPO | Admitting: Internal Medicine

## 2014-02-06 DIAGNOSIS — E291 Testicular hypofunction: Secondary | ICD-10-CM

## 2014-02-06 DIAGNOSIS — E349 Endocrine disorder, unspecified: Secondary | ICD-10-CM

## 2014-02-06 MED ORDER — TESTOSTERONE CYPIONATE 200 MG/ML IM SOLN: 200.0000 mg | INTRAMUSCULAR | Status: DC

## 2014-02-06 MED ORDER — TESTOSTERONE CYPIONATE 200 MG/ML IM SOLN
200.0000 mg | INTRAMUSCULAR | Status: DC
  Administered 2014-02-06: 200 mg via INTRAMUSCULAR

## 2014-03-04 ENCOUNTER — Ambulatory Visit: Payer: BC Managed Care – PPO | Admitting: *Deleted

## 2014-03-05 ENCOUNTER — Ambulatory Visit (INDEPENDENT_AMBULATORY_CARE_PROVIDER_SITE_OTHER): Payer: BC Managed Care – PPO | Admitting: *Deleted

## 2014-03-05 DIAGNOSIS — E291 Testicular hypofunction: Secondary | ICD-10-CM

## 2014-03-05 MED ORDER — TESTOSTERONE CYPIONATE 200 MG/ML IM SOLN
200.0000 mg | Freq: Once | INTRAMUSCULAR | Status: AC
  Administered 2014-03-05: 200 mg via INTRAMUSCULAR

## 2014-04-24 ENCOUNTER — Ambulatory Visit (INDEPENDENT_AMBULATORY_CARE_PROVIDER_SITE_OTHER): Payer: BC Managed Care – PPO | Admitting: *Deleted

## 2014-04-24 DIAGNOSIS — E349 Endocrine disorder, unspecified: Secondary | ICD-10-CM

## 2014-04-24 DIAGNOSIS — E291 Testicular hypofunction: Secondary | ICD-10-CM

## 2014-04-24 MED ORDER — TESTOSTERONE CYPIONATE 200 MG/ML IM SOLN
200.0000 mg | Freq: Once | INTRAMUSCULAR | Status: AC
  Administered 2014-04-24: 200 mg via INTRAMUSCULAR

## 2014-05-28 ENCOUNTER — Ambulatory Visit (INDEPENDENT_AMBULATORY_CARE_PROVIDER_SITE_OTHER): Payer: BC Managed Care – PPO | Admitting: *Deleted

## 2014-05-28 DIAGNOSIS — E291 Testicular hypofunction: Secondary | ICD-10-CM

## 2014-05-28 DIAGNOSIS — Z23 Encounter for immunization: Secondary | ICD-10-CM

## 2014-05-28 MED ORDER — TESTOSTERONE CYPIONATE 200 MG/ML IM SOLN
200.0000 mg | Freq: Once | INTRAMUSCULAR | Status: AC
  Administered 2014-05-28: 200 mg via INTRAMUSCULAR

## 2014-06-25 ENCOUNTER — Ambulatory Visit (INDEPENDENT_AMBULATORY_CARE_PROVIDER_SITE_OTHER): Payer: BC Managed Care – PPO | Admitting: *Deleted

## 2014-06-25 DIAGNOSIS — E349 Endocrine disorder, unspecified: Secondary | ICD-10-CM

## 2014-06-25 DIAGNOSIS — E291 Testicular hypofunction: Secondary | ICD-10-CM

## 2014-06-25 MED ORDER — TESTOSTERONE CYPIONATE 200 MG/ML IM SOLN
200.0000 mg | Freq: Once | INTRAMUSCULAR | Status: AC
  Administered 2014-06-25: 200 mg via INTRAMUSCULAR

## 2014-06-29 ENCOUNTER — Telehealth: Payer: Self-pay | Admitting: Internal Medicine

## 2014-06-29 NOTE — Telephone Encounter (Signed)
Pt needs an appt.  Has not been seen since 2013

## 2014-06-29 NOTE — Telephone Encounter (Signed)
[id number] [place], [place] requesting re-fill on finasteride (PROSCAR) 5 MG tablet

## 2014-06-29 NOTE — Telephone Encounter (Signed)
Fax sent back to pharmacy notifying them of appt needed

## 2014-07-17 ENCOUNTER — Other Ambulatory Visit: Payer: Self-pay | Admitting: *Deleted

## 2014-07-17 MED ORDER — FINASTERIDE 1 MG PO TABS: 1.0000 mg | ORAL_TABLET | Freq: Every day | ORAL | Status: DC

## 2014-07-22 ENCOUNTER — Other Ambulatory Visit: Payer: Self-pay | Admitting: *Deleted

## 2014-07-22 MED ORDER — TADALAFIL 20 MG PO TABS
10.0000 mg | ORAL_TABLET | ORAL | Status: DC | PRN
Start: 2014-07-22 — End: 2014-10-12

## 2014-07-22 MED ORDER — FINASTERIDE 1 MG PO TABS: 1.0000 mg | ORAL_TABLET | Freq: Every day | ORAL | Status: DC

## 2014-07-27 ENCOUNTER — Telehealth: Payer: Self-pay | Admitting: Internal Medicine

## 2014-07-27 NOTE — Telephone Encounter (Signed)
Pa for Cialis was denied.  Patient must try and fail Viagra.

## 2014-07-28 ENCOUNTER — Telehealth: Payer: Self-pay | Admitting: Internal Medicine

## 2014-07-28 NOTE — Telephone Encounter (Signed)
Yes please

## 2014-07-28 NOTE — Telephone Encounter (Signed)
Pt needs an appt has not been seen since 2013

## 2014-07-28 NOTE — Telephone Encounter (Signed)
[id number] [place], [place] [address] requesting re-fill on SULFAZINE EC 500 MG EC tablet

## 2014-07-28 NOTE — Telephone Encounter (Signed)
Do you want me to offer a med check appointment and then a transfer??

## 2014-07-28 NOTE — Telephone Encounter (Signed)
Patient states he's a personal friend of Dr. Arnoldo Morale and thought Dr. [name] was going to continue to see him. He said he will give him a call and talk to him about setting up with a new pcp or re-fills.

## 2014-07-28 NOTE — Telephone Encounter (Signed)
Pt needs an appt

## 2014-07-28 NOTE — Telephone Encounter (Signed)
Spoke to patient and he said he will call Dr. Arnoldo Morale and speak with him.

## 2014-08-06 ENCOUNTER — Other Ambulatory Visit: Payer: Self-pay

## 2014-08-06 MED ORDER — SULFASALAZINE 500 MG PO TBEC: DELAYED_RELEASE_TABLET | ORAL | Status: DC

## 2014-08-06 NOTE — Telephone Encounter (Signed)
Pharm called bc pt has not had this refill. Informed pharm that pt will need appt for refills.

## 2014-08-06 NOTE — Telephone Encounter (Signed)
Ok per Dr. Arnoldo Morale to refill medication. Called and spoke with pt and [facility] is [facility]

## 2014-08-19 ENCOUNTER — Ambulatory Visit (INDEPENDENT_AMBULATORY_CARE_PROVIDER_SITE_OTHER): Payer: BC Managed Care – PPO | Admitting: *Deleted

## 2014-08-19 DIAGNOSIS — E349 Endocrine disorder, unspecified: Secondary | ICD-10-CM

## 2014-08-19 DIAGNOSIS — E291 Testicular hypofunction: Secondary | ICD-10-CM

## 2014-08-19 MED ORDER — TESTOSTERONE CYPIONATE 200 MG/ML IM SOLN
200.0000 mg | Freq: Once | INTRAMUSCULAR | Status: AC
  Administered 2014-08-19: 200 mg via INTRAMUSCULAR

## 2014-10-12 ENCOUNTER — Ambulatory Visit (INDEPENDENT_AMBULATORY_CARE_PROVIDER_SITE_OTHER): Payer: BLUE CROSS/BLUE SHIELD | Admitting: Family Medicine

## 2014-10-12 ENCOUNTER — Encounter: Payer: Self-pay | Admitting: Family Medicine

## 2014-10-12 VITALS — BP 132/82 | HR 88 | Temp 97.8°F | Ht 73.25 in | Wt 295.1 lb

## 2014-10-12 DIAGNOSIS — E291 Testicular hypofunction: Secondary | ICD-10-CM

## 2014-10-12 DIAGNOSIS — Z7689 Persons encountering health services in other specified circumstances: Secondary | ICD-10-CM

## 2014-10-12 DIAGNOSIS — L659 Nonscarring hair loss, unspecified: Secondary | ICD-10-CM

## 2014-10-12 DIAGNOSIS — K5190 Ulcerative colitis, unspecified, without complications: Secondary | ICD-10-CM

## 2014-10-12 DIAGNOSIS — I10 Essential (primary) hypertension: Secondary | ICD-10-CM

## 2014-10-12 DIAGNOSIS — G4730 Sleep apnea, unspecified: Secondary | ICD-10-CM

## 2014-10-12 DIAGNOSIS — Z7189 Other specified counseling: Secondary | ICD-10-CM

## 2014-10-12 DIAGNOSIS — E785 Hyperlipidemia, unspecified: Secondary | ICD-10-CM

## 2014-10-12 MED ORDER — TESTOSTERONE CYPIONATE 200 MG/ML IM SOLN
200.0000 mg | Freq: Once | INTRAMUSCULAR | Status: AC
  Administered 2014-10-12: 200 mg via INTRAMUSCULAR

## 2014-10-12 NOTE — Progress Notes (Signed)
Pre visit review using our clinic review tool, if applicable. No additional management support is needed unless otherwise documented below in the visit note. 

## 2014-10-12 NOTE — Addendum Note (Signed)
Addended by: Agnes Lawrence on: 10/12/2014 03:30 PM   Modules accepted: Orders

## 2014-10-12 NOTE — Patient Instructions (Addendum)
BEFORE YOU LEAVE: -testosterone injection -schedule a morning lab in about 2 weeks - come fasting but drink plenty of water -CPE in 3-4 months  Please call to schedule appointment with your gastroenterologist  We will have you see the urologist for your testosterone deficiency - will place referral  We recommend the following healthy lifestyle measures: - eat a healthy diet consisting of lots of vegetables, fruits, beans, nuts, seeds, healthy meats such as white chicken and fish and whole grains.  - avoid fried foods, fast food, processed foods, sodas, red meet and other fattening foods.  - get a least 150 minutes of aerobic exercise per week.

## 2014-10-12 NOTE — Progress Notes (Addendum)
HPI:  Richard [name] is here to establish care. Used to see Dr. Arnoldo Morale - takes many medications but not seen since 2013.    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 JArnoldo Moraleat some point started refilling his salfazine  ROS negative for unless reported above: fevers, unintentional weight loss, hearing or vision loss, chest pain, palpitations, struggling to breath, hemoptysis, melena, hematochezia, hematuria, falls, loc, si, thoughts of self harm  Past Medical History  Diagnosis Date  . HYPOGONADISM, MALE 07/24/2008  . HYPERLIPIDEMIA 05/28/2007  . 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 HYPERTENSION 05/28/2007  . Acute maxillary sinusitis 08/09/2009  . ALLERGIC RHINITIS CAUSE UNSPECIFIED 05/21/2009  . COLITIS, ULCERATIVE 05/28/2007  . 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 BACK PAIN, LUMBAR, WITH RADICULOPATHY 03/18/2010  . SLEEP APNEA 12/18/2008    wears CPAP  . UNS ADVRS EFF UNS RX MEDICINAL&BIOLOGICAL SBSTNC 03/24/2008  . COLONIC POLYPS, HX OF 05/28/2007  . Internal hemorrhoids   . Glaucoma   . Injury of left foot   . BACK PAIN, LUMBAR, WITH RADICULOPATHY 03/18/2010    Qualifier: Diagnosis of  By: JArnoldo MoraleMD, JBalinda Quails    Past Surgical History  Procedure  Laterality Date  . Nasal sinus surgery    . Colonoscopy    . Polypectomy      Family History  Problem Relation Age of Onset  . Colon cancer Neg Hx   . Esophageal cancer Neg Hx   . Stomach cancer Neg Hx   . Rectal cancer Neg Hx   . Hyperlipidemia Other   . Hypertension Other   . Obesity Other   . Heart disease Father   . Breast cancer Mother     <50  . Ulcerative colitis Cousin     2 cousins paternal side    History   Social History  . Marital Status: Single    Spouse Name: N/A    Number of Children: 0  . Years of Education: N/A   Occupational History  . Realtor   .     Social History Main Topics  . Smoking status: Former SResearch scientist (life sciences) . Smokeless tobacco: Never Used  . Alcohol Use: 3.5 oz/week    7 drink(s) per week     Comment: 1 drink per day at most  . Drug Use: No  . Sexual Activity: None   Other Topics Concern  . 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  amLODipine-benazepril (LOTREL) 10-40 MG per capsule, Take 1 capsule by mouth daily.,  Disp: 30 capsule, Rfl: 11 .  finasteride (PROSCAR) 5 MG tablet, TAKE 1 TABLET EVERY DAY, Disp: 30 tablet, Rfl: 11 .  Multiple Vitamins-Iron (MULTIVITAMIN/IRON) TABS, Take 1 tablet by mouth daily.  , Disp: , Rfl:  .  NASONEX 50 MCG/ACT nasal spray, PLACE 2 SQUIRTS IN EACH NOSTRIL DAILY, Disp: 17 g, Rfl: 11 .  sulfaSALAzine (SULFAZINE EC) 500 MG EC tablet, TAKE 3 TABLETS BY MOUTH 2 TIMES DAILY., Disp: 180 tablet, Rfl: 5 .  testosterone cypionate (DEPOTESTOTERONE CYPIONATE) 200 MG/ML injection, Inject 1 mL (200 mg total) into the muscle every 28 (twenty-eight) days., Disp: 10 mL, Rfl: 0  EXAM:  Filed Vitals:   10/12/14 1439  BP: 132/82  Pulse: 88  Temp: 97.8 F (36.6 C)    Body mass index is 38.65 kg/(m^2).  GENERAL: vitals reviewed and  listed above, alert, oriented, appears well hydrated and in no acute distress  HEENT: atraumatic, conjunttiva clear, no obvious abnormalities on inspection of external nose and ears  NECK: no obvious masses on inspection  LUNGS: clear to auscultation bilaterally, no wheezes, rales or rhonchi, good air movement  CV: HRRR, no peripheral edema  MS: moves all extremities without noticeable abnormality  PSYCH: pleasant and cooperative, no obvious depression or anxiety  ASSESSMENT AND PLAN:  Discussed the following assessment and plan:  Ulcerative colitis, without complications - Plan: CMP with eGFR, CBC with Differential  Essential hypertension  Hyperlipemia - Plan: Lipid Panel  Hypogonadism male - Plan: Testosterone, PSA  Encounter to establish care  Alopecia  Sleep apnea  -We reviewed the PMH, PSH, FH, SH, Meds and Allergies. -We provided refills for any medications we will prescribe as needed. -We addressed current concerns per orders and patient instructions. -We have asked for records for pertinent exams, studies, vaccines and notes from previous providers. -We have advised patient to follow up per instructions below. -LABS advised - CBC w/ diff and CMP for monitoring with current meds, testosterone level, lipids as not done in some time, PSA screening discussed  -advised need to see GI for refills UC medication and for regular survailence -advised urology eval for hypogonadism as seems testoterone levels remained low despite tx and he is asymptomatic even when missed dose - he is agreeable to this and for prostate health, also to assist in need for and length of testosterone treatment - he wants to check labs first, inj given today  -Patient advised to return or notify a doctor immediately if symptoms worsen or persist or new concerns arise.  Patient Instructions  BEFORE YOU LEAVE: -testosterone injection -schedule a morning lab in about 2 weeks - come fasting but drink  plenty of water -CPE in 3-4 months  Please call to schedule appointment with your gastroenterologist  We will have you see the urologist for your testosterone deficiency - will place referral  We recommend the following healthy lifestyle measures: - eat a healthy diet consisting of lots of vegetables, fruits, beans, nuts, seeds, healthy meats such as white chicken and fish and whole grains.  - avoid fried foods, fast food, processed foods, sodas, red meet and other fattening foods.  - get a least 150 minutes of aerobic exercise per week.           Colin Benton R.

## 2014-10-15 ENCOUNTER — Telehealth: Payer: Self-pay | Admitting: Family Medicine

## 2014-10-15 NOTE — Telephone Encounter (Signed)
emmi emailed °

## 2014-10-26 ENCOUNTER — Other Ambulatory Visit (INDEPENDENT_AMBULATORY_CARE_PROVIDER_SITE_OTHER): Payer: BLUE CROSS/BLUE SHIELD

## 2014-10-26 DIAGNOSIS — E785 Hyperlipidemia, unspecified: Secondary | ICD-10-CM

## 2014-10-26 LAB — LIPID PANEL
Cholesterol: 274 mg/dL — ABNORMAL HIGH (ref 0–200)
HDL: 47.9 mg/dL (ref >39.00)
NonHDL: 226.1
Total CHOL/HDL Ratio: 6
Triglycerides: 203 mg/dL — ABNORMAL HIGH (ref 0.0–149.0)
VLDL: 40.6 mg/dL — ABNORMAL HIGH (ref 0.0–40.0)

## 2014-10-26 LAB — HEPATIC FUNCTION PANEL
ALK PHOS: 51 U/L (ref 39–117)
ALT: 21 U/L (ref 0–53)
AST: 20 U/L (ref 0–37)
Albumin: 4.5 g/dL (ref 3.5–5.2)
BILIRUBIN TOTAL: 0.4 mg/dL (ref 0.2–1.2)
Bilirubin, Direct: 0.1 mg/dL (ref 0.0–0.3)
Total Protein: 7.2 g/dL (ref 6.0–8.3)

## 2014-10-26 LAB — LDL CHOLESTEROL, DIRECT: Direct LDL: 197 mg/dL

## 2014-11-23 ENCOUNTER — Encounter: Payer: Self-pay | Admitting: Gastroenterology

## 2014-12-04 ENCOUNTER — Ambulatory Visit: Payer: BLUE CROSS/BLUE SHIELD | Admitting: *Deleted

## 2014-12-04 DIAGNOSIS — E349 Endocrine disorder, unspecified: Secondary | ICD-10-CM

## 2014-12-04 MED ORDER — TESTOSTERONE CYPIONATE 200 MG/ML IM SOLN
200.0000 mg | Freq: Once | INTRAMUSCULAR | Status: AC
  Administered 2014-12-04: 200 mg via INTRAMUSCULAR

## 2015-01-04 ENCOUNTER — Telehealth: Payer: Self-pay

## 2015-01-04 ENCOUNTER — Ambulatory Visit (AMBULATORY_SURGERY_CENTER): Payer: Self-pay | Admitting: *Deleted

## 2015-01-04 VITALS — Ht 73.0 in | Wt 301.0 lb

## 2015-01-04 DIAGNOSIS — K5190 Ulcerative colitis, unspecified, without complications: Secondary | ICD-10-CM

## 2015-01-04 DIAGNOSIS — I10 Essential (primary) hypertension: Secondary | ICD-10-CM

## 2015-01-04 MED ORDER — NA SULFATE-K SULFATE-MG SULF 17.5-3.13-1.6 GM/177ML PO SOLN: ORAL | Status: DC

## 2015-01-04 MED ORDER — AMLODIPINE BESY-BENAZEPRIL HCL 10-40 MG PO CAPS: 1.0000 | ORAL_CAPSULE | Freq: Every day | ORAL | Status: DC

## 2015-01-04 NOTE — Progress Notes (Signed)
Patient denies any allergies to eggs or soy. Patient denies any problems with anesthesia/sedation. Patient denies any oxygen use at home and does not take any diet/weight loss medications. EMMI education assisgned to patient on colonoscopy, this was explained and instructions given to patient. 

## 2015-01-04 NOTE — Telephone Encounter (Signed)
[facility] ref[facility] request for amLODipine-benazepril (LOTREL) 10-40 MG per capsule

## 2015-01-05 ENCOUNTER — Encounter: Payer: Self-pay | Admitting: Family Medicine

## 2015-01-05 ENCOUNTER — Ambulatory Visit (INDEPENDENT_AMBULATORY_CARE_PROVIDER_SITE_OTHER): Payer: BLUE CROSS/BLUE SHIELD | Admitting: Family Medicine

## 2015-01-05 VITALS — BP 128/80 | HR 83 | Temp 98.1°F | Ht 73.25 in | Wt 299.5 lb

## 2015-01-05 DIAGNOSIS — I10 Essential (primary) hypertension: Secondary | ICD-10-CM | POA: Diagnosis not present

## 2015-01-05 DIAGNOSIS — Z114 Encounter for screening for human immunodeficiency virus [HIV]: Secondary | ICD-10-CM

## 2015-01-05 DIAGNOSIS — Z131 Encounter for screening for diabetes mellitus: Secondary | ICD-10-CM

## 2015-01-05 DIAGNOSIS — Z1159 Encounter for screening for other viral diseases: Secondary | ICD-10-CM

## 2015-01-05 DIAGNOSIS — E785 Hyperlipidemia, unspecified: Secondary | ICD-10-CM

## 2015-01-05 DIAGNOSIS — G4730 Sleep apnea, unspecified: Secondary | ICD-10-CM

## 2015-01-05 DIAGNOSIS — Z6839 Body mass index (BMI) 39.0-39.9, adult: Secondary | ICD-10-CM

## 2015-01-05 DIAGNOSIS — K5190 Ulcerative colitis, unspecified, without complications: Secondary | ICD-10-CM

## 2015-01-05 DIAGNOSIS — E291 Testicular hypofunction: Secondary | ICD-10-CM

## 2015-01-05 DIAGNOSIS — Z0000 Encounter for general adult medical examination without abnormal findings: Secondary | ICD-10-CM

## 2015-01-05 MED ORDER — ROSUVASTATIN CALCIUM 20 MG PO TABS: 20.0000 mg | ORAL_TABLET | Freq: Every day | ORAL | Status: DC

## 2015-01-05 MED ORDER — TRIAMCINOLONE ACETONIDE 0.025 % EX CREA: 1.0000 "application " | TOPICAL_CREAM | Freq: Two times a day (BID) | CUTANEOUS | Status: DC

## 2015-01-05 NOTE — Patient Instructions (Addendum)
BEFORE YOU LEAVE: -labs in 3 months -follow up 1 week after labs  Start the crestor 69m daily  We recommend the following healthy lifestyle measures: - eat a healthy diet consisting of lots of vegetables, fruits, beans, nuts, seeds, healthy meats such as white chicken and fish and whole grains.  - avoid fried foods, fast food, processed foods, sodas, red meet and other fattening foods.  - get a least 150 minutes of aerobic exercise per week.   Try the triamcinilone cream 1-2 times daily for 1-2 weeks for the itchy skin and avoid starch detergents or starches

## 2015-01-05 NOTE — Progress Notes (Signed)
Pre visit review using our clinic review tool, if applicable. No additional management support is needed unless otherwise documented below in the visit note. 

## 2015-01-05 NOTE — Progress Notes (Signed)
HPI:  Here for CPE:  -Concerns and/or follow up today:   HTN: -meds: amlodipine-benazapril 10-40 -denies: CP, SOB, DOE  HLD: -labs showed elevated cholesterol 10/2014 -reports: on statin in the past, then stopped on his own- was on vytorin in the past -denies: statin intolerance   Testosterone def/hypogonadism/ED: -referred to urology for evaluation and management as had no symptoms off testosterone therapy when I first saw him -he reports he saw urologist last week - PSA and genital exam done and is doing trial off testosterone -denies decreased libido, hair loss, gynecomastia, poor energy, urinary symptoms, hx CV disease, LUTS  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 SFuller Plan initial visit with me -last OV notes in 2013 and appears not compliant with follow up q 2 year colonoscopies or labs for medication survalliance or follow up -now scheduled for his repeat colonoscopy -appears Dr. JArnoldo Moraleat some point started refilling his salfazine  -Diet: variety of foods, balance and well rounded but portion sizes are and issue - trying to eat healthier  -Exercise: no regular exercise - walking the dog a few blocks daily  -Diabetes and Dyslipidemia Screening: done recently  -Hx of HTN: no  -Vaccines: UTD  -sexual activity: yes, male partner, no new partners  -wants STI testing, Hep C screening (if born 165-1963: not done  -FH colon or prstate ca: see FH Last colon cancer screening: scheduled to do colonscopy - sees GI Last prostate ca screening: done with urologist  -Alcohol, Tobacco, drug use: see social history  Review of Systems - no fevers, unintentional weight loss, vision loss, hearing loss, chest pain, sob, hemoptysis, melena, hematochezia, hematuria, genital discharge, changing or concerning skin lesions, bleeding, bruising, loc,  thoughts of self harm or SI  Past Medical History  Diagnosis Date  . HYPOGONADISM, MALE 07/24/2008  . HYPERLIPIDEMIA 05/28/2007  . HYPERTENSION 05/28/2007  . ALLERGIC RHINITIS CAUSE UNSPECIFIED 05/21/2009  . COLITIS, ULCERATIVE 05/28/2007  . 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 BACK PAIN, LUMBAR, WITH RADICULOPATHY 03/18/2010  . SLEEP APNEA 12/18/2008    wears CPAP  . COLONIC POLYPS, HX OF 05/28/2007  . Internal hemorrhoids   . Glaucoma     Past Surgical History  Procedure Laterality Date  . Nasal sinus surgery    . Colonoscopy    . Polypectomy      Family History  Problem Relation Age of Onset  . Colon cancer Neg Hx   . Esophageal cancer Neg Hx   . Stomach cancer Neg Hx   . Rectal cancer Neg Hx   . Hyperlipidemia Other   . Hypertension Other   . Obesity Other   . Heart disease Father   . Breast cancer Mother     <50  . Ulcerative colitis Cousin     2 cousins paternal side    History   Social History  . Marital Status: Single    Spouse Name: N/A  . Number of Children: 0  . Years of Education: N/A   Occupational History  . Realtor   .     Social History Main Topics  . Smoking status: Former SResearch scientist (life sciences) . Smokeless tobacco: Never Used  . Alcohol Use: 3.5 oz/week    7 Standard drinks or equivalent per week     Comment: 1 drink per day at most  . Drug Use: No  . Sexual Activity: Not  on file   Other Topics Concern  . None   Social History Narrative   Work or School: Chartered certified accountant Situation: lives alone      Spiritual Beliefs: none      Lifestyle: walks some; diet is not great           Current outpatient prescriptions:  .  amLODipine-benazepril (LOTREL) 10-40 MG per capsule, Take 1 capsule by mouth daily., Disp: 30 capsule, Rfl: 11 .  finasteride (PROSCAR) 5 MG tablet, TAKE 1 TABLET EVERY DAY, Disp: 30 tablet, Rfl: 11 .  Multiple Vitamins-Iron (MULTIVITAMIN/IRON) TABS, Take 1 tablet by mouth daily.  , Disp: , Rfl:  .  NASONEX 50 MCG/ACT nasal  spray, PLACE 2 SQUIRTS IN EACH NOSTRIL DAILY, Disp: 17 g, Rfl: 11 .  sulfaSALAzine (SULFAZINE EC) 500 MG EC tablet, TAKE 3 TABLETS BY MOUTH 2 TIMES DAILY., Disp: 180 tablet, Rfl: 5 .  testosterone cypionate (DEPOTESTOTERONE CYPIONATE) 200 MG/ML injection, Inject 1 mL (200 mg total) into the muscle every 28 (twenty-eight) days., Disp: 10 mL, Rfl: 0 .  rosuvastatin (CRESTOR) 20 MG tablet, Take 1 tablet (20 mg total) by mouth daily., Disp: 90 tablet, Rfl: 3  EXAM:  Filed Vitals:   01/05/15 0934  BP: 128/80  Pulse: 83  Temp: 98.1 F (36.7 C)  TempSrc: Oral  Height: 6' 1.25" (1.861 m)  Weight: 299 lb 8 oz (135.852 kg)    Estimated body mass index is 39.23 kg/(m^2) as calculated from the following:   Height as of this encounter: 6' 1.25" (1.861 m).   Weight as of this encounter: 299 lb 8 oz (135.852 kg).  GENERAL: vitals reviewed and listed below, alert, oriented, appears well hydrated and in no acute distress  HEENT: head atraumatic, PERRLA, normal appearance of eyes, ears, nose and mouth. moist mucus membranes.  NECK: supple, no masses or lymphadenopathy, large neck diameter  LUNGS: clear to auscultation bilaterally, no rales, rhonchi or wheeze  CV: HRRR, no peripheral edema or cyanosis, normal pedal pulses  ABDOMEN: bowel sounds normal, soft, non tender to palpation, no masses, no rebound or guarding  GU: declined - done with urologist  RECTAL: declined  SKIN: few patches of dry skin around neck region  MS: normal gait, moves all extremities normally  NEURO: CN II-XII grossly intact, normal muscle strength and sensation to light touch on extremities  PSYCH: normal affect, pleasant and cooperative  ASSESSMENT AND PLAN:  Discussed the following assessment and plan:  1. Visit for preventive health examination -Discussed and advised all [place] preventive services health task force level A and B recommendations for age, sex and risks. -Advised at least 150 minutes of exercise  per week and a healthy diet low in saturated fats and sweets and consisting of fresh fruits and vegetables, lean meats such as fish and white chicken and whole grains. -schedule FASTING labs, studies and vaccines per orders this encounter  2. Hyperlipemia - Lipid Panel; Future - after discussion of risks/benefits opted to start rosuvastatin (CRESTOR) 20 MG tablet; Take 1 tablet (20 mg total) by mouth daily.  Dispense: 90 tablet; Refill: 3 -lifestyle recs  3. Ulcerative colitis, without complications -managed by GI, scheduled for colonoscopy  4. Essential hypertension - Basic metabolic panel; Future -stable  5. BMI 39.0-39.9,adult -lifestyle recs -labs per orders  6. Hypogonadism male -off testosterone, doing well -sees urologist  7. Sleep apnea -stable  8. Diabetes mellitus screening - Hemoglobin A1c; Future  9. Encounter for  screening for HIV - HIV antibody (with reflex); Future  10. Need for hepatitis C screening test - Hep C Antibody; Future  Patient advised to return to clinic immediately if symptoms worsen or persist or new concerns.  Patient Instructions  BEFORE YOU LEAVE: -labs in 3 months -follow up 1 week after labs  Start the crestor 42m daily  We recommend the following healthy lifestyle measures: - eat a healthy diet consisting of lots of vegetables, fruits, beans, nuts, seeds, healthy meats such as white chicken and fish and whole grains.  - avoid fried foods, fast food, processed foods, sodas, red meet and other fattening foods.  - get a least 150 minutes of aerobic exercise per week.      No Follow-up on file.   KColin BentonR.

## 2015-01-07 ENCOUNTER — Encounter: Payer: Self-pay | Admitting: Gastroenterology

## 2015-01-19 ENCOUNTER — Other Ambulatory Visit: Payer: Self-pay | Admitting: Gastroenterology

## 2015-01-19 ENCOUNTER — Ambulatory Visit (AMBULATORY_SURGERY_CENTER): Payer: BLUE CROSS/BLUE SHIELD | Admitting: Gastroenterology

## 2015-01-19 ENCOUNTER — Encounter: Payer: Self-pay | Admitting: Gastroenterology

## 2015-01-19 VITALS — BP 108/71 | HR 73 | Temp 98.2°F | Resp 12 | Ht 73.0 in | Wt 301.0 lb

## 2015-01-19 DIAGNOSIS — K635 Polyp of colon: Secondary | ICD-10-CM | POA: Diagnosis not present

## 2015-01-19 DIAGNOSIS — D122 Benign neoplasm of ascending colon: Secondary | ICD-10-CM

## 2015-01-19 DIAGNOSIS — D123 Benign neoplasm of transverse colon: Secondary | ICD-10-CM

## 2015-01-19 DIAGNOSIS — Z1211 Encounter for screening for malignant neoplasm of colon: Secondary | ICD-10-CM

## 2015-01-19 DIAGNOSIS — D124 Benign neoplasm of descending colon: Secondary | ICD-10-CM

## 2015-01-19 DIAGNOSIS — K5190 Ulcerative colitis, unspecified, without complications: Secondary | ICD-10-CM

## 2015-01-19 MED ORDER — SODIUM CHLORIDE 0.9 % IV SOLN: 500.0000 mL | INTRAVENOUS | Status: DC

## 2015-01-19 NOTE — Progress Notes (Signed)
No problems noted in the recovery room. maw 

## 2015-01-19 NOTE — Progress Notes (Signed)
Called to room to assist during endoscopic procedure.  Patient ID and intended procedure confirmed with present staff. Received instructions for my participation in the procedure from the performing physician.  

## 2015-01-19 NOTE — Progress Notes (Signed)
Report to PACU, RN, vss, BBS= Clear.  

## 2015-01-19 NOTE — Patient Instructions (Addendum)
YOU HAD AN ENDOSCOPIC PROCEDURE TODAY AT [facility]:   Refer to the procedure report that was given to you for any specific questions about what was found during the examination.  If the procedure report does not answer your questions, please call your gastroenterologist to clarify.  If you requested that your care partner not be given the details of your procedure findings, then the procedure report has been included in a sealed envelope for you to review at your convenience later.  YOU SHOULD EXPECT: Some feelings of bloating in the abdomen. Passage of more gas than usual.  Walking can help get rid of the air that was put into your GI tract during the procedure and reduce the bloating. If you had a lower endoscopy (such as a colonoscopy or flexible sigmoidoscopy) you may notice spotting of blood in your stool or on the toilet paper. If you underwent a bowel prep for your procedure, you may not have a normal bowel movement for a few days.  Please Note:  You might notice some irritation and congestion in your nose or some drainage.  This is from the oxygen used during your procedure.  There is no need for concern and it should clear up in a day or so.  SYMPTOMS TO REPORT IMMEDIATELY:   Following lower endoscopy (colonoscopy or flexible sigmoidoscopy):  Excessive amounts of blood in the stool  Significant tenderness or worsening of abdominal pains  Swelling of the abdomen that is new, acute  Fever of 100F or higher   For urgent or emergent issues, a gastroenterologist can be reached at any hour by calling [phone].   DIET: Your first meal following the procedure should be a small meal and then it is ok to progress to your normal diet. Heavy or fried foods are harder to digest and may make you feel nauseous or bloated.  Likewise, meals heavy in dairy and vegetables can increase bloating.  Drink plenty of fluids but you should avoid alcoholic beverages for 24  hours.  ACTIVITY:  You should plan to take it easy for the rest of today and you should NOT DRIVE or use heavy machinery until tomorrow (because of the sedation medicines used during the test).    FOLLOW UP: Our staff will call the number listed on your records the next business day following your procedure to check on you and address any questions or concerns that you may have regarding the information given to you following your procedure. If we do not reach you, we will leave a message.  However, if you are feeling well and you are not experiencing any problems, there is no need to return our call.  We will assume that you have returned to your regular daily activities without incident.  If any biopsies were taken you will be contacted by phone or by letter within the next 1-3 weeks.  Please call us at [phone] if you have not heard about the biopsies in 3 weeks.    SIGNATURES/CONFIDENTIALITY: You and/or your care partner have signed paperwork which will be entered into your electronic medical record.  These signatures attest to the fact that that the information above on your After Visit Summary has been reviewed and is understood.  Full responsibility of the confidentiality of this discharge information lies with you and/or your care-partner.    Handouts were given to your care partner on polyps, hemorrhoids, and ulcerative colitis. You may resume your current medications today.  Except  hold aspirin and all other NSAIDS for 2 weeks.  Tylenol is okay. Await biopsy results. Please call if any questions or concerns.

## 2015-01-19 NOTE — Op Note (Signed)
[place]  [address] [place], [id number]   COLONOSCOPY PROCEDURE REPORT PATIENT: Richard [name], Richard [name]  MR#: [id number] BIRTHDATE: Jan 08, 1956 , 19  yrs. old GENDER: male ENDOSCOPIST: Ladene Artist, MD, [facility] PROCEDURE DATE:  01/19/2015 PROCEDURE:   Colonoscopy, surveillance , Colonoscopy with biopsy, and Colonoscopy with snare polypectomy First Screening Colonoscopy - Avg.  risk and is 50 yrs.  old or older - No.  Prior Negative Screening - Now for repeat screening. N/A  History of Adenoma - Now for follow-up colonoscopy & has been > or = to 3 yrs.  N/A  Polyps removed today? Yes Polyps Removed Today ASA CLASS:   Class II INDICATIONS:Screening for colonic neoplasia and Inflammatory Bowel Disease (at least 8 years pancolitis or 15 years left sided colitis). MEDICATIONS: Monitored anesthesia care, Propofol 400 mg IV, and lidocaine 200 mg IV DESCRIPTION OF PROCEDURE:   After the risks benefits and alternatives of the procedure were thoroughly explained, informed consent was obtained.  The digital rectal exam revealed no abnormalities of the rectum.   The [id number]  endoscope was introduced through the anus and advanced to the cecum, which was identified by both the appendix and ileocecal valve. No adverse events experienced.   The quality of the prep was (Suprep was used) excellent.  The instrument was then slowly withdrawn as the colon was fully examined.  COLON FINDINGS: Two sessile polyps measuring 7-8 mm in size were found at the ileocecal valve.  Polypectomies were performed using snare cautery.  The resection was complete, the polyp tissue was completely retrieved and sent to histology.   Four sessile polyps measuring 7-10 mm in size were found in the descending colon. Polypectomies were performed using snare cautery.  The resection was complete, the polyp tissue was completely retrieved and sent to histology.   Abnormal mucosa was found in the  descending colon, at the splenic flexure, and in the transverse colon.  The mucosa was erythematous and had loss of vascularity and granularity.  Multiple random biopsies were performed.  The examination was otherwise normal. Multiple random biopsies were performed. Retroflexedviews revealed internal Grade I hemorrhoids. The time to cecum = 3.4 Withdrawal time = 19.4   The scope was withdrawn and the procedure completed. COMPLICATIONS: There were no immediate complications. ENDOSCOPIC IMPRESSION: 1.   Two sessile polyps at the ileocecal valve; polypectomies performed using snare cautery 2.   Four sessile polyps in the descending colon; polypectomies performed using snare cautery 3.  Abnormal mucosa in the descending colon, at the splenic flexure, and in the transverse colon; The mucosa was erythematous and had loss of vascularity and granularity; multiple random biopsies performed 4.   Grade l internal hemorrhoids  RECOMMENDATIONS: 1.  Hold Aspirin and all other NSAIDS for 2 weeks. 2.  Await pathology results 3.  Repeat Colonoscopy in 1 year.  eSigned:  Ladene Artist, MD, [facility] 01/19/2015 9:47 AM

## 2015-01-20 ENCOUNTER — Telehealth: Payer: Self-pay

## 2015-01-20 NOTE — Telephone Encounter (Signed)
  Follow up Call-  Call back number 01/19/2015 05/23/2012  Post procedure Call Back phone  # [phone]  Permission to leave phone message Yes Yes     Patient questions:  Do you have a fever, pain , or abdominal swelling? No. Pain Score  0 *  Have you tolerated food without any problems? Yes.    Have you been able to return to your normal activities? Yes.    Do you have any questions about your discharge instructions: Diet   No. Medications  No. Follow up visit  No.  Do you have questions or concerns about your Care? No.  Actions: * If pain score is 4 or above: No action needed, pain <4.

## 2015-01-27 ENCOUNTER — Encounter: Payer: Self-pay | Admitting: Gastroenterology

## 2015-02-03 ENCOUNTER — Telehealth: Payer: Self-pay

## 2015-02-03 MED ORDER — SULFASALAZINE 500 MG PO TBEC: DELAYED_RELEASE_TABLET | ORAL | Status: DC

## 2015-02-03 NOTE — Telephone Encounter (Signed)
Advise refills on this through his gastroenterologist if possible. Thanks.

## 2015-02-03 NOTE — Telephone Encounter (Signed)
Please refill for 1 year. Had colonoscopy recently.

## 2015-02-03 NOTE — Telephone Encounter (Signed)
[id number]- [place],  - [address]: sulfaSALAzine (SULFAZINE EC) 500 MG EC tablet

## 2015-02-03 NOTE — Telephone Encounter (Signed)
Sent the prescription of sulfasalazine to the pharmacy for one year.

## 2015-03-31 ENCOUNTER — Other Ambulatory Visit (INDEPENDENT_AMBULATORY_CARE_PROVIDER_SITE_OTHER): Payer: BLUE CROSS/BLUE SHIELD

## 2015-03-31 DIAGNOSIS — E785 Hyperlipidemia, unspecified: Secondary | ICD-10-CM | POA: Diagnosis not present

## 2015-03-31 DIAGNOSIS — Z114 Encounter for screening for human immunodeficiency virus [HIV]: Secondary | ICD-10-CM

## 2015-03-31 DIAGNOSIS — I10 Essential (primary) hypertension: Secondary | ICD-10-CM | POA: Diagnosis not present

## 2015-03-31 DIAGNOSIS — Z131 Encounter for screening for diabetes mellitus: Secondary | ICD-10-CM

## 2015-03-31 DIAGNOSIS — Z1159 Encounter for screening for other viral diseases: Secondary | ICD-10-CM

## 2015-03-31 LAB — BASIC METABOLIC PANEL
BUN: 13 mg/dL (ref 6–23)
CO2: 27 meq/L (ref 19–32)
Calcium: 9.3 mg/dL (ref 8.4–10.5)
Chloride: 105 mEq/L (ref 96–112)
Creatinine, Ser: 0.87 mg/dL (ref 0.40–1.50)
GFR: 96.05 mL/min (ref >60.00)
Glucose, Bld: 107 mg/dL — ABNORMAL HIGH (ref 70–99)
Potassium: 4.6 mEq/L (ref 3.5–5.1)
SODIUM: 141 meq/L (ref 135–145)

## 2015-03-31 LAB — LIPID PANEL
CHOLESTEROL: 173 mg/dL (ref 0–200)
HDL: 50.7 mg/dL (ref >39.00)
LDL CALC: 101 mg/dL — AB (ref 0–99)
NonHDL: 122.3
TRIGLYCERIDES: 106 mg/dL (ref 0.0–149.0)
Total CHOL/HDL Ratio: 3
VLDL: 21.2 mg/dL (ref 0.0–40.0)

## 2015-03-31 LAB — HEMOGLOBIN A1C: HEMOGLOBIN A1C: 5.2 % (ref 4.6–6.5)

## 2015-04-01 LAB — HIV ANTIBODY (ROUTINE TESTING W REFLEX): HIV: NONREACTIVE

## 2015-04-01 LAB — HEPATITIS C ANTIBODY: HCV Ab: NEGATIVE

## 2015-04-06 ENCOUNTER — Encounter: Payer: Self-pay | Admitting: Family Medicine

## 2015-04-06 ENCOUNTER — Ambulatory Visit (INDEPENDENT_AMBULATORY_CARE_PROVIDER_SITE_OTHER): Payer: BLUE CROSS/BLUE SHIELD | Admitting: Family Medicine

## 2015-04-06 VITALS — BP 124/82 | HR 97 | Temp 98.8°F | Ht 73.0 in | Wt 297.7 lb

## 2015-04-06 DIAGNOSIS — I10 Essential (primary) hypertension: Secondary | ICD-10-CM | POA: Diagnosis not present

## 2015-04-06 DIAGNOSIS — E785 Hyperlipidemia, unspecified: Secondary | ICD-10-CM

## 2015-04-06 DIAGNOSIS — E291 Testicular hypofunction: Secondary | ICD-10-CM | POA: Diagnosis not present

## 2015-04-06 DIAGNOSIS — K5190 Ulcerative colitis, unspecified, without complications: Secondary | ICD-10-CM | POA: Diagnosis not present

## 2015-04-06 NOTE — Progress Notes (Signed)
Pre visit review using our clinic review tool, if applicable. No additional management support is needed unless otherwise documented below in the visit note. 

## 2015-04-06 NOTE — Progress Notes (Signed)
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 StFuller Plannot compliant with follow up q 2 year colonoscopies or labs for medication survalliance or follow up when first established with me and prior PCP had been refilling meds without ensuring compliance with disease management and surveillance -he did finally get his colonoscopy 01/2015- with multiple abnormalities and repeat advised in 1 year. -Denies: abd pain, fevers, blood in stools     ROS: See pertinent positives and negatives per HPI.  Past Medical History  Diagnosis Date  . HYPOGONADISM, MALE 07/24/2008  . HYPERLIPIDEMIA 05/28/2007  . HYPERTENSION 05/28/2007  . ALLERGIC RHINITIS CAUSE UNSPECIFIED 05/21/2009  . COLITIS, ULCERATIVE 05/28/2007  . 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 BACK PAIN, LUMBAR, WITH RADICULOPATHY 03/18/2010  . SLEEP APNEA 12/18/2008    wears CPAP  . COLONIC POLYPS, HX OF 05/28/2007  . Internal hemorrhoids   . Glaucoma     Past Surgical History  Procedure Laterality Date  . Nasal sinus surgery    . Colonoscopy    . Polypectomy      Family History  Problem Relation Age of Onset  . Colon cancer Neg Hx   . Esophageal cancer Neg Hx   . Stomach cancer Neg Hx   . Rectal cancer Neg Hx   . Hyperlipidemia Other   . Hypertension Other   . Obesity Other   . Heart disease Father   . Breast cancer Mother     <50  . Ulcerative colitis Cousin     2  cousins paternal side    History   Social History  . Marital Status: Single    Spouse Name: N/A  . Number of Children: 0  . Years of Education: N/A   Occupational History  . Realtor   .     Social History Main Topics  . Smoking status: Former SmResearch scientist (life sciences). Smokeless tobacco: Never Used  . Alcohol Use: 3.5 oz/week    7 Standard drinks or equivalent per week     Comment: 1 drink per day at most  . Drug Use: No  . Sexual Activity: Not on file   Other Topics Concern  . 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  amLODipine-benazepril (LOTREL) 10-40 MG per capsule, Take 1 capsule by mouth daily., Disp: 30 capsule, Rfl: 11 .  finasteride (PROSCAR) 5 MG tablet, TAKE 1 TABLET EVERY DAY, Disp: 30 tablet, Rfl: 11 .  Multiple Vitamins-Iron (MULTIVITAMIN/IRON) TABS, Take 1 tablet by mouth daily.  , Disp: , Rfl:  .  NASONEX 50 MCG/ACT nasal spray, PLACE 2 SQUIRTS IN EACH NOSTRIL DAILY, Disp: 17 g, Rfl: 11 .  rosuvastatin (CRESTOR) 20 MG tablet, Take 1 tablet (20 mg total) by mouth daily.,  Disp: 90 tablet, Rfl: 3 .  sulfaSALAzine (AZULFIDINE) 500 MG EC tablet, TAKE 3 TABLETS BY MOUTH 2 TIMES DAILY., Disp: 180 tablet, Rfl: 11 .  triamcinolone (KENALOG) 0.025 % cream, Apply 1 application topically 2 (two) times daily., Disp: 30 g, Rfl: 0  EXAM:  Filed Vitals:   04/06/15 0851  BP: 124/82  Pulse: 97  Temp: 98.8 F (37.1 C)    Body mass index is 39.29 kg/(m^2).  GENERAL: vitals reviewed and listed above, alert, oriented, appears well hydrated and in no acute distress  HEENT: atraumatic, conjunttiva clear, no obvious abnormalities on inspection of external nose and ears  NECK: no obvious masses on inspection  LUNGS: clear to auscultation bilaterally, no wheezes, rales or rhonchi, good air  movement  CV: HRRR, no peripheral edema  MS: moves all extremities without noticeable abnormality  PSYCH: pleasant and cooperative, no obvious depression or anxiety  ASSESSMENT AND PLAN:  Discussed the following assessment and plan:  Hyperlipemia  Essential hypertension  Hypogonadism male  Ulcerative colitis, without complications  -he is doing well -continue current medications -lifestyle recs -flu shot when available -follow up in 6 months -Patient advised to return or notify a doctor immediately if symptoms worsen or persist or new concerns arise.  Patient Instructions  BEFORE YOU LEAVE: -follow up in 6 months  We recommend the following healthy lifestyle measures: - eat a healthy diet consisting of lots of vegetables, fruits, beans, nuts, seeds, healthy meats such as white chicken and fish and whole grains.  - avoid fried foods, fast food, processed foods, sodas, red meet and other fattening foods.  - get a least 150 minutes of aerobic exercise per week.       Colin Benton R.

## 2015-04-06 NOTE — Patient Instructions (Signed)
BEFORE YOU LEAVE: -follow up in 6 months  We recommend the following healthy lifestyle measures: - eat a healthy diet consisting of lots of vegetables, fruits, beans, nuts, seeds, healthy meats such as white chicken and fish and whole grains.  - avoid fried foods, fast food, processed foods, sodas, red meet and other fattening foods.  - get a least 150 minutes of aerobic exercise per week.

## 2015-04-07 ENCOUNTER — Encounter: Payer: Self-pay | Admitting: Gastroenterology

## 2015-04-23 ENCOUNTER — Encounter: Payer: Self-pay | Admitting: Family Medicine

## 2015-04-23 MED ORDER — ROSUVASTATIN CALCIUM 20 MG PO TABS: 20.0000 mg | ORAL_TABLET | Freq: Every day | ORAL | Status: DC

## 2015-04-23 NOTE — Telephone Encounter (Signed)
Sent in generic, but I am not sure if pharmacies are carrying generic yet. Hope they are!

## 2015-08-09 ENCOUNTER — Other Ambulatory Visit: Payer: Self-pay | Admitting: Internal Medicine

## 2015-08-09 MED ORDER — BUPROPION HCL ER (XL) 150 MG PO TB24: 150.0000 mg | ORAL_TABLET | Freq: Every day | ORAL | Status: DC

## 2015-08-13 ENCOUNTER — Ambulatory Visit (INDEPENDENT_AMBULATORY_CARE_PROVIDER_SITE_OTHER): Payer: BLUE CROSS/BLUE SHIELD | Admitting: *Deleted

## 2015-08-13 DIAGNOSIS — Z23 Encounter for immunization: Secondary | ICD-10-CM

## 2015-08-25 ENCOUNTER — Other Ambulatory Visit: Payer: Self-pay | Admitting: Internal Medicine

## 2015-08-25 DIAGNOSIS — F329 Major depressive disorder, single episode, unspecified: Secondary | ICD-10-CM

## 2015-08-25 DIAGNOSIS — F32A Depression, unspecified: Secondary | ICD-10-CM

## 2015-08-25 MED ORDER — BUPROPION HCL ER (XL) 300 MG PO TB24: 300.0000 mg | ORAL_TABLET | Freq: Every day | ORAL | Status: DC

## 2015-09-01 ENCOUNTER — Encounter: Payer: Self-pay | Admitting: Family Medicine

## 2015-09-01 NOTE — Telephone Encounter (Signed)
I called the pt and informed him of the message per Dr Maudie Mercury and he denies any thoughts of suicide and I scheduled an appt for tomorrow at 3:15pm.

## 2015-09-02 ENCOUNTER — Encounter: Payer: Self-pay | Admitting: Family Medicine

## 2015-09-02 ENCOUNTER — Ambulatory Visit (INDEPENDENT_AMBULATORY_CARE_PROVIDER_SITE_OTHER): Payer: BLUE CROSS/BLUE SHIELD | Admitting: Family Medicine

## 2015-09-02 VITALS — BP 120/78 | HR 92 | Temp 98.0°F | Ht 73.0 in | Wt 295.8 lb

## 2015-09-02 DIAGNOSIS — F331 Major depressive disorder, recurrent, moderate: Secondary | ICD-10-CM | POA: Diagnosis not present

## 2015-09-02 DIAGNOSIS — F411 Generalized anxiety disorder: Secondary | ICD-10-CM | POA: Diagnosis not present

## 2015-09-02 MED ORDER — ESCITALOPRAM OXALATE 10 MG PO TABS: 10.0000 mg | ORAL_TABLET | Freq: Every day | ORAL | Status: DC

## 2015-09-02 NOTE — Progress Notes (Signed)
HPI:  Acute visit for:  Depression and anxiety: -reports intermittent mild depression his whole life, but usually he is able to "work through it" on his own -for the last 3-4 months has had depressed mood, anhedonia, irritability, generalized worry about everything, poor sleep -occasionally he has had SI - but no plan and reports "I would never harm myself" as he feels this would be too harmful to others, does not have a gun or a plan and he feels safe with no recent thoughts of self harm -he reports he is gay, has good relationships and good friends whom he has talked to about this -his friend and prior PCP put him on wellbutrin 4 weeks ago and increased the dose 2 weeks ago and this seems to have worsened his anxiety and has not helped the depression -he denies ever being on medication prior -he drinks alcohol occasionally, smoke weed occasionally and has taken a klonopin a few times from a friend -he denies any other drug use -he denies any other symptoms and reports his !BD has been well controlled -he denies: manic symptoms, hallucinations, current SI or thoughts of self harm or harm to others  ROS: See pertinent positives and negatives per HPI.  Past Medical History  Diagnosis Date  . HYPOGONADISM, MALE 07/24/2008  . HYPERLIPIDEMIA 05/28/2007  . HYPERTENSION 05/28/2007  . ALLERGIC RHINITIS CAUSE UNSPECIFIED 05/21/2009  . COLITIS, ULCERATIVE 05/28/2007  . 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 BACK PAIN, LUMBAR, WITH RADICULOPATHY 03/18/2010  . SLEEP APNEA 12/18/2008    wears CPAP  . COLONIC POLYPS, HX OF 05/28/2007  . Internal hemorrhoids   . Glaucoma     Past Surgical History  Procedure Laterality Date  . Nasal sinus surgery    . Colonoscopy    . Polypectomy      Family History  Problem Relation Age of Onset  . Colon cancer Neg Hx   . Esophageal cancer Neg Hx   . Stomach cancer Neg Hx   . Rectal cancer Neg Hx   . Hyperlipidemia Other   . Hypertension Other   . Obesity  Other   . Heart disease Father   . Breast cancer Mother     <50  . Ulcerative colitis Cousin     2 cousins paternal side    Social History   Social History  . Marital Status: Single    Spouse Name: N/A  . Number of Children: 0  . Years of Education: N/A   Occupational History  . Realtor   .     Social History Main Topics  . Smoking status: Former Research scientist (life sciences)  . Smokeless tobacco: Never Used  . Alcohol Use: 3.5 oz/week    7 Standard drinks or equivalent per week     Comment: 1 drink per day at most  . Drug Use: No  . Sexual Activity: Not Asked   Other Topics Concern  . None   Social History Narrative   Work or School: Chartered certified accountant Situation: lives alone      Spiritual Beliefs: none      Lifestyle: walks some; diet is not great           Current outpatient prescriptions:  .  amLODipine-benazepril (LOTREL) 10-40 MG per capsule, Take 1 capsule by mouth daily., Disp: 30 capsule, Rfl: 11 .  buPROPion (WELLBUTRIN XL) 300 MG 24 hr tablet, Take 1 tablet (300 mg total) by mouth daily., Disp: 30 tablet, Rfl: 5 .  finasteride (PROSCAR) 5 MG tablet, TAKE 1 TABLET EVERY DAY, Disp: 30 tablet, Rfl: 11 .  Multiple Vitamins-Iron (MULTIVITAMIN/IRON) TABS, Take 1 tablet by mouth daily.  , Disp: , Rfl:  .  NASONEX 50 MCG/ACT nasal spray, PLACE 2 SQUIRTS IN EACH NOSTRIL DAILY, Disp: 17 g, Rfl: 11 .  rosuvastatin (CRESTOR) 20 MG tablet, Take 1 tablet (20 mg total) by mouth daily., Disp: 90 tablet, Rfl: 3 .  sulfaSALAzine (AZULFIDINE) 500 MG EC tablet, TAKE 3 TABLETS BY MOUTH 2 TIMES DAILY., Disp: 180 tablet, Rfl: 11 .  triamcinolone (KENALOG) 0.025 % cream, Apply 1 application topically 2 (two) times daily., Disp: 30 g, Rfl: 0 .  escitalopram (LEXAPRO) 10 MG tablet, Take 1 tablet (10 mg total) by mouth daily., Disp: 30 tablet, Rfl: 1  EXAM:  Filed Vitals:   09/02/15 1518  BP: 120/78  Pulse: 92  Temp: 98 F (36.7 C)    Body mass index is 39.03 kg/(m^2).  GENERAL:  vitals reviewed and listed above, alert, oriented, appears well hydrated and in no acute distress  HEENT: atraumatic, conjunttiva clear, no obvious abnormalities on inspection of external nose and ears  NECK: no obvious masses on inspection  LUNGS: clear to auscultation bilaterally, no wheezes, rales or rhonchi, good air movement  CV: HRRR, no peripheral edema  MS: moves all extremities without noticeable abnormality  PSYCH: depressed mood, tearful at times  ASSESSMENT AND PLAN:  Discussed the following assessment and plan:  Moderate episode of recurrent major depressive disorder (HCC)  GAD (generalized anxiety disorder) - Plan: TSH, Basic metabolic panel  -he assures me he is safe and reports he would "NEVER hurt himself" -he agrees to call 911 and seek emergency care for worsening severe depression or further thoughts of self harm -the wellbutrin seems to be worsening his anxiety -discussed options and he agreed to set up CBT with info provide -he opted to stop wellbutrin and do and SSRI after discussion risks, beneftis, options -Patient advised to return or notify a doctor immediately if symptoms worsen or persist or new concerns arise.  Patient Instructions  BEFORE YOU LEAVE: -schedule follow up in 4 weeks -labs  STOP the Wellbutrin  START lexapro 10 mg  Call today to schedule an appointment with Dr. Glennon Hamilton  Seek care immediately and call 911 and/or go to the [facility] or the emergency room if thoughts of hurting yourself worsening severe depression, feeling out of control or you fel [place], Kianni Lheureux R.

## 2015-09-02 NOTE — Progress Notes (Signed)
Pre visit review using our clinic review tool, if applicable. No additional management support is needed unless otherwise documented below in the visit note. 

## 2015-09-02 NOTE — Patient Instructions (Signed)
BEFORE YOU LEAVE: -schedule follow up in 4 weeks -labs  STOP the Wellbutrin  START lexapro 10 mg  Call today to schedule an appointment with Dr. Glennon Hamilton  Seek care immediately and call 911 and/or go to the [facility] or the emergency room if thoughts of hurting yourself worsening severe depression, feeling out of control or you fel uns[place]

## 2015-09-03 LAB — BASIC METABOLIC PANEL
BUN: 16 mg/dL (ref 6–23)
CALCIUM: 9.7 mg/dL (ref 8.4–10.5)
CO2: 24 mEq/L (ref 19–32)
CREATININE: 0.87 mg/dL (ref 0.40–1.50)
Chloride: 101 mEq/L (ref 96–112)
GFR: 95.9 mL/min (ref >60.00)
GLUCOSE: 103 mg/dL — AB (ref 70–99)
Potassium: 4 mEq/L (ref 3.5–5.1)
Sodium: 136 mEq/L (ref 135–145)

## 2015-09-03 LAB — TSH: TSH: 1.05 u[IU]/mL (ref 0.35–4.50)

## 2015-09-04 ENCOUNTER — Other Ambulatory Visit: Payer: Self-pay | Admitting: Family Medicine

## 2015-09-23 ENCOUNTER — Ambulatory Visit (INDEPENDENT_AMBULATORY_CARE_PROVIDER_SITE_OTHER): Payer: BLUE CROSS/BLUE SHIELD | Admitting: Psychology

## 2015-09-23 DIAGNOSIS — F411 Generalized anxiety disorder: Secondary | ICD-10-CM | POA: Diagnosis not present

## 2015-09-23 DIAGNOSIS — F331 Major depressive disorder, recurrent, moderate: Secondary | ICD-10-CM | POA: Diagnosis not present

## 2015-10-04 ENCOUNTER — Ambulatory Visit: Payer: BLUE CROSS/BLUE SHIELD | Admitting: Family Medicine

## 2015-10-07 ENCOUNTER — Encounter: Payer: Self-pay | Admitting: Family Medicine

## 2015-10-07 ENCOUNTER — Ambulatory Visit (INDEPENDENT_AMBULATORY_CARE_PROVIDER_SITE_OTHER): Payer: BLUE CROSS/BLUE SHIELD | Admitting: Family Medicine

## 2015-10-07 VITALS — BP 128/72 | HR 91 | Temp 98.2°F | Ht 73.0 in | Wt 287.7 lb

## 2015-10-07 DIAGNOSIS — F3342 Major depressive disorder, recurrent, in full remission: Secondary | ICD-10-CM | POA: Diagnosis not present

## 2015-10-07 DIAGNOSIS — E669 Obesity, unspecified: Secondary | ICD-10-CM | POA: Diagnosis not present

## 2015-10-07 DIAGNOSIS — E785 Hyperlipidemia, unspecified: Secondary | ICD-10-CM | POA: Diagnosis not present

## 2015-10-07 DIAGNOSIS — I10 Essential (primary) hypertension: Secondary | ICD-10-CM | POA: Diagnosis not present

## 2015-10-07 DIAGNOSIS — L659 Nonscarring hair loss, unspecified: Secondary | ICD-10-CM

## 2015-10-07 DIAGNOSIS — E291 Testicular hypofunction: Secondary | ICD-10-CM

## 2015-10-07 DIAGNOSIS — L649 Androgenic alopecia, unspecified: Secondary | ICD-10-CM | POA: Insufficient documentation

## 2015-10-07 DIAGNOSIS — G4730 Sleep apnea, unspecified: Secondary | ICD-10-CM

## 2015-10-07 DIAGNOSIS — Z23 Encounter for immunization: Secondary | ICD-10-CM | POA: Diagnosis not present

## 2015-10-07 DIAGNOSIS — K5190 Ulcerative colitis, unspecified, without complications: Secondary | ICD-10-CM

## 2015-10-07 HISTORY — DX: Major depressive disorder, recurrent, in full remission: F33.42

## 2015-10-07 NOTE — Progress Notes (Signed)
Pre visit review using our clinic review tool, if applicable. No additional management support is needed unless otherwise documented below in the visit note. 

## 2015-10-07 NOTE — Progress Notes (Signed)
HPI:  Richard [name] is a pleasant 60 yo here for a follow up visit:  Depression and anxiety: -reports intermittent mild depression his whole life, worsening 08/2015 - prior PCP put him on wellbutrin which made symptoms worse --> changed to lexapro 09/02/15 and advised CBT with Dr. Glennon Hamilton -today reports: doing "much better, great!"; saw Dr. Glennon Hamilton once and has follow up tomorrow -he reports he is gay, has good relationships and good friends whom he has talked to about this -he drinks alcohol occasionally, smokes weed occasionally and has taken a klonopin a few times from a friend -he denies any other drug use -he denies: manic symptoms, hallucinations, current SI or thoughts of self harm or harm to others  HTN: -meds: amlodipine-benazapril 10-40 -denies: CP, SOB, DOE  HLD/Obesity/OSA: -labs showed elevated cholesterol 10/2014; improved on crestor -reports: doing well on the crestor 66m -denies: statin intolerance  -uses CPAP -reports walking do nightly, joined gym - has not gone yet, working on cutting back on carbs and sweets and feels has lost some weight  Testosterone def/hypogonadism/ED: -referred to urology for evaluation and management as had no symptoms off testosterone therapy when I first saw him - doing trial off testosterone and feels fine -denies decreased libido, hair loss, gynecomastia, poor energy, urinary symptoms, hx CV disease, LUTS  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 StFuller Plan reports has follow up in a few weeks -in the past was not compliant with follow up colonoscopies or disease management and surveillance -he did finally get his colonoscopy 01/2015- with multiple abnormalities and repeat advised in 1 year -Denies: abd pain, fevers, blood in stools   ROS: See pertinent positives and negatives per HPI.  Past Medical History  Diagnosis Date  . HYPOGONADISM, MALE  07/24/2008  . HYPERLIPIDEMIA 05/28/2007  . HYPERTENSION 05/28/2007  . ALLERGIC RHINITIS CAUSE UNSPECIFIED 05/21/2009  . COLITIS, ULCERATIVE 05/28/2007  . 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 BACK PAIN, LUMBAR, WITH RADICULOPATHY 03/18/2010  . SLEEP APNEA 12/18/2008    wears CPAP  . COLONIC POLYPS, HX OF 05/28/2007  . Internal hemorrhoids   . Glaucoma   . Rec[place] major [place]    with GAD     Past Surgical History  Procedure Laterality Date  . Nasal sinus surgery    . Colonoscopy    . Polypectomy      Family History  Problem Relation Age of Onset  . Colon cancer Neg Hx   . Esophageal cancer Neg Hx   . Stomach cancer Neg Hx   . Rectal cancer Neg Hx   . Hyperlipidemia Other   . Hypertension Other   . Obesity Other   . Heart disease Father   . Breast cancer Mother     <50  . Ulcerative colitis Cousin     2 cousins paternal side    Social History   Social History  . Marital Status: Single    Spouse Name: N/A  . Number of Children: 0  . Years of Education: N/A   Occupational History  . Realtor   .     Social History Main Topics  . Smoking status: Former SmResearch scientist (life sciences). Smokeless tobacco: Never Used  . Alcohol Use: 3.5 oz/week    7 Standard drinks or equivalent per week     Comment: 1 drink per day at most  . Drug Use: No  . Sexual  Activity: Not Asked   Other Topics Concern  . None   Social History Narrative   Work or School: Chartered certified accountant Situation: lives alone      Spiritual Beliefs: none      Lifestyle: walks some; diet is not great           Current outpatient prescriptions:  .  amLODipine-benazepril (LOTREL) 10-40 MG per capsule, Take 1 capsule by mouth daily., Disp: 30 capsule, Rfl: 11 .  escitalopram (LEXAPRO) 10 MG tablet, Take 1 tablet (10 mg total) by mouth daily., Disp: 30 tablet, Rfl: 1 .  finasteride (PROSCAR) 5 MG tablet, TAKE 1 TABLET EVERY DAY, Disp: 30 tablet, Rfl: 11 .  Multiple  Vitamins-Iron (MULTIVITAMIN/IRON) TABS, Take 1 tablet by mouth daily.  , Disp: , Rfl:  .  NASONEX 50 MCG/ACT nasal spray, PLACE 2 SQUIRTS IN EACH NOSTRIL DAILY, Disp: 17 g, Rfl: 11 .  rosuvastatin (CRESTOR) 20 MG tablet, Take 1 tablet (20 mg total) by mouth daily., Disp: 90 tablet, Rfl: 3 .  sulfaSALAzine (AZULFIDINE) 500 MG EC tablet, TAKE 3 TABLETS BY MOUTH 2 TIMES DAILY., Disp: 180 tablet, Rfl: 11 .  triamcinolone (KENALOG) 0.025 % cream, Apply 1 application topically 2 (two) times daily., Disp: 30 g, Rfl: 0  EXAM:  Filed Vitals:   10/07/15 0854  BP: 128/72  Pulse: 91  Temp: 98.2 F (36.8 C)    Body mass index is 37.97 kg/(m^2).  GENERAL: vitals reviewed and listed above, alert, oriented, appears well hydrated and in no acute distress  HEENT: atraumatic, conjunttiva clear, no obvious abnormalities on inspection of external nose and ears  NECK: no obvious masses on inspection  LUNGS: clear to auscultation bilaterally, no wheezes, rales or rhonchi, good air movement  CV: HRRR, no peripheral edema  MS: moves all extremities without noticeable abnormality  PSYCH: pleasant and cooperative, no obvious depression or anxiety  ASSESSMENT AND PLAN:  Discussed the following assessment and plan:  Recurrent major depressive disorder, in full remission ([place]) - with GAD -doing great -continue SSRI and CBT  Essential hypertension Hyperlipemia Obesity OSA -congratulated on lifestyle changes -cont current tx -advised exercise, healthy diet and wt loss -labs at CPE  Hypogonadism male -stable  Ulcerative colitis without complications, unspecified location ([place]) -follow up with GI as planned  Hair loss -cont current tx  -Patient advised to return or notify a doctor immediately if symptoms worsen or persist or new concerns arise.  Patient Instructions  BEFORE YOU LEAVE: -Tdap -Physical Exam in 3-4 months; come fasting and will plan to do labs that day - drink plenty of  water!  We recommend the following healthy lifestyle measures: - eat a healthy whole foods diet consisting of regular small meals composed of vegetables, fruits, beans, nuts, seeds, healthy meats such as white chicken and fish and whole grains.  - avoid sweets, white starchy foods, fried foods, fast food, processed foods, sodas, red meet and other fattening foods.  - get a least 150-300 minutes of aerobic exercise per week.       Colin Benton R.

## 2015-10-07 NOTE — Patient Instructions (Signed)
BEFORE YOU LEAVE: -Tdap -Physical Exam in 3-4 months; come fasting and will plan to do labs that day - drink plenty of water!  We recommend the following healthy lifestyle measures: - eat a healthy whole foods diet consisting of regular small meals composed of vegetables, fruits, beans, nuts, seeds, healthy meats such as white chicken and fish and whole grains.  - avoid sweets, white starchy foods, fried foods, fast food, processed foods, sodas, red meet and other fattening foods.  - get a least 150-300 minutes of aerobic exercise per week.

## 2015-10-08 ENCOUNTER — Ambulatory Visit (INDEPENDENT_AMBULATORY_CARE_PROVIDER_SITE_OTHER): Payer: BLUE CROSS/BLUE SHIELD | Admitting: Psychology

## 2015-10-08 DIAGNOSIS — F411 Generalized anxiety disorder: Secondary | ICD-10-CM

## 2015-10-08 DIAGNOSIS — F331 Major depressive disorder, recurrent, moderate: Secondary | ICD-10-CM

## 2015-10-21 ENCOUNTER — Encounter: Payer: Self-pay | Admitting: Gastroenterology

## 2015-10-21 ENCOUNTER — Ambulatory Visit (INDEPENDENT_AMBULATORY_CARE_PROVIDER_SITE_OTHER): Payer: BLUE CROSS/BLUE SHIELD | Admitting: Gastroenterology

## 2015-10-21 VITALS — BP 122/70 | HR 88 | Ht 72.0 in | Wt 288.4 lb

## 2015-10-21 DIAGNOSIS — Z86010 Personal history of colonic polyps: Secondary | ICD-10-CM

## 2015-10-21 DIAGNOSIS — K5100 Ulcerative (chronic) pancolitis without complications: Secondary | ICD-10-CM

## 2015-10-21 DIAGNOSIS — Z860101 Personal history of adenomatous and serrated colon polyps: Secondary | ICD-10-CM | POA: Insufficient documentation

## 2015-10-21 MED ORDER — SULFASALAZINE 500 MG PO TBEC: DELAYED_RELEASE_TABLET | ORAL | Status: DC

## 2015-10-21 NOTE — Progress Notes (Signed)
    History of Present Illness: This is a 60 year old male returning for follow-up of ulcerative colitis. He reports no ongoing gastrointestinal complaints. No abdominal pain, rectal bleeding or diarrhea. He underwent colonoscopy in May 2016 with mild colitis activity noted and 2 small adenomatous polyps removed. No dysplasia was noted.   Current Medications, Allergies, Past Medical History, Past Surgical History, Family History and Social History were reviewed in [employer] record.  Physical Exam: General: Well developed, well nourished, no acute distress Head: Normocephalic and atraumatic Eyes:  sclerae anicteric, EOMI Ears: Normal auditory acuity Mouth: No deformity or lesions Lungs: Clear throughout to auscultation Heart: Regular rate and rhythm; no murmurs, rubs or bruits Abdomen: Soft, non tender and non distended. No masses, hepatosplenomegaly or hernias noted. Normal Bowel sounds Musculoskeletal: Symmetrical with no gross deformities  Pulses:  Normal pulses noted Extremities: No clubbing, cyanosis, edema or deformities noted Neurological: Alert oriented x 4, grossly nonfocal Psychological:  Alert and cooperative. Normal mood and affect  Assessment and Recommendations:  I spent 15 minutes of face-to-face time with the patient. Greater than 50% of the time was spent counseling and coordinating care.

## 2015-10-21 NOTE — Assessment & Plan Note (Addendum)
No active UC symptoms. BMET in December 2016 was normal. Continue sulfasalazine 1.5 g po bid. Return office visit in 1 year. Surveillance colonoscopy recommended at a 2-year interval in May 2018.

## 2015-10-21 NOTE — Assessment & Plan Note (Signed)
Surveillance colonoscopy for adenomatous colon polyps at the time of UC surveillance in May 2018.

## 2015-10-21 NOTE — Patient Instructions (Signed)
We have sent the following medications to your pharmacy for you to pick up at your convenience:Sulfasalazine.   Thank you for choosing me and [facility].  [name]. [name], MD., [name]

## 2015-10-27 ENCOUNTER — Ambulatory Visit: Payer: BLUE CROSS/BLUE SHIELD | Admitting: Psychology

## 2015-11-01 ENCOUNTER — Other Ambulatory Visit: Payer: Self-pay | Admitting: Family Medicine

## 2015-12-02 ENCOUNTER — Other Ambulatory Visit: Payer: Self-pay | Admitting: Family Medicine

## 2016-01-01 ENCOUNTER — Other Ambulatory Visit: Payer: Self-pay | Admitting: Family Medicine

## 2016-01-07 ENCOUNTER — Encounter: Payer: Self-pay | Admitting: Family Medicine

## 2016-01-10 ENCOUNTER — Ambulatory Visit (INDEPENDENT_AMBULATORY_CARE_PROVIDER_SITE_OTHER): Payer: BLUE CROSS/BLUE SHIELD | Admitting: Family Medicine

## 2016-01-10 ENCOUNTER — Encounter: Payer: Self-pay | Admitting: Family Medicine

## 2016-01-10 VITALS — BP 120/72 | HR 83 | Temp 98.2°F | Ht 72.0 in | Wt 288.3 lb

## 2016-01-10 DIAGNOSIS — J309 Allergic rhinitis, unspecified: Secondary | ICD-10-CM | POA: Diagnosis not present

## 2016-01-10 DIAGNOSIS — H66002 Acute suppurative otitis media without spontaneous rupture of ear drum, left ear: Secondary | ICD-10-CM

## 2016-01-10 MED ORDER — AMOXICILLIN 875 MG PO TABS: 875.0000 mg | ORAL_TABLET | Freq: Two times a day (BID) | ORAL | Status: DC

## 2016-01-10 NOTE — Progress Notes (Signed)
Pre visit review using our clinic review tool, if applicable. No additional management support is needed unless otherwise documented below in the visit note. 

## 2016-01-10 NOTE — Progress Notes (Signed)
HPI:  Richard [name] is a pleasant 60 year old here for an acute visit for a bad taste in his mouth and sinus issues. He reports for the last week he has had increased sinus congestion, postnasal drip, a bad taste in his mouth and left ear pain. Denies fevers, sinus pain, shortness of breath or tooth pain. He does have seasonal allergies. He uses Nasonex one time to time, but not on a regular basis.  ROS: See pertinent positives and negatives per HPI.  Past Medical History  Diagnosis Date  . HYPOGONADISM, MALE 07/24/2008  . HYPERLIPIDEMIA 05/28/2007  . HYPERTENSION 05/28/2007  . ALLERGIC RHINITIS CAUSE UNSPECIFIED 05/21/2009  . COLITIS, ULCERATIVE 05/28/2007  . 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 BACK PAIN, LUMBAR, WITH RADICULOPATHY 03/18/2010  . SLEEP APNEA 12/18/2008    wears CPAP  . Tubular adenoma of colon   . Internal hemorrhoids   . Glaucoma   . Recurrent major depressive disorder, in full remission ([place]) 10/07/2015    with GAD     Past Surgical History  Procedure Laterality Date  . Nasal sinus surgery    . Colonoscopy    . Polypectomy      Family History  Problem Relation Age of Onset  . Colon cancer Neg Hx   . Esophageal cancer Neg Hx   . Stomach cancer Neg Hx   . Rectal cancer Neg Hx   . Hyperlipidemia Father   . Hypertension Father   . Heart disease Father   . Breast cancer Mother     <50  . Ulcerative colitis Cousin     2 cousins paternal side    Social History   Social History  . Marital Status: Single    Spouse Name: N/A  . Number of Children: 0  . Years of Education: N/A   Occupational History  . Realtor   .     Social History Main Topics  . Smoking status: Former Smoker    Types: Cigarettes    Quit date: 10/20/2009  . Smokeless tobacco: Never Used  . Alcohol Use: 4.2 oz/week    7 Standard drinks or equivalent per week     Comment: 1 drink per day at most  . Drug Use: No  . Sexual Activity: Not Asked   Other Topics Concern  . 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  amLODipine-benazepril (LOTREL) 10-40 MG capsule, TAKE 1 CAPSULE BY MOUTH DAILY., Disp: 30 capsule, Rfl: 5 .  escitalopram (LEXAPRO) 10 MG tablet, TAKE 1 TABLET (10 MG TOTAL) BY MOUTH DAILY., Disp: 30 tablet, Rfl: 4 .  finasteride (PROPECIA) 1 MG tablet, TAKE 1 TABLET (1 MG TOTAL) BY MOUTH DAILY., Disp: 30 tablet, Rfl: 0 .  finasteride (PROSCAR) 5 MG tablet, TAKE 1 TABLET EVERY DAY, Disp: 30 tablet, Rfl: 11 .  Multiple Vitamins-Iron (MULTIVITAMIN/IRON) TABS, Take 1 tablet by mouth daily.  , Disp: , Rfl:  .  NASONEX 50 MCG/ACT nasal spray, PLACE 2 SQUIRTS IN EACH NOSTRIL DAILY (Patient taking differently: PLACE 2 SQUIRTS IN EACH NOSTRIL AS NEEDED), Disp: 17 g, Rfl: 11 .  rosuvastatin (CRESTOR) 20 MG tablet, Take 1 tablet (20 mg total) by mouth daily., Disp: 90 tablet, Rfl: 3 .  sulfaSALAzine (AZULFIDINE) 500 MG EC tablet, TAKE 3 TABLETS BY MOUTH 2 TIMES DAILY., Disp: 180 tablet, Rfl: 11 .  triamcinolone (KENALOG) 0.025 % cream, Apply 1 application topically 2 (two) times daily., Disp: 30 g, Rfl: 0 .  amoxicillin (AMOXIL) 875 MG tablet, Take 1 tablet (875 mg total) by mouth 2 (two) times daily., Disp: 20 tablet, Rfl: 0  EXAM:  Filed Vitals:   01/10/16 0905  BP: 120/72  Pulse: 83  Temp: 98.2 F (36.8 C)    Body mass index is 39.09 kg/(m^2).  GENERAL: vitals reviewed and listed above, alert, oriented, appears well hydrated and in no acute distress  HEENT: atraumatic, conjunttiva clear, no obvious abnormalities on inspection of external nose and ears, normal appearance of ear canals and TMs except for abdominal, red and bulging left TM with discolored effusion, clear nasal congestion, mild post oropharyngeal erythema with PND, no tonsillar edema or exudate, no sinus  TTP  NECK: no obvious masses on inspection  LUNGS: clear to auscultation bilaterally, no wheezes, rales or rhonchi, good air movement  CV: HRRR, no peripheral edema  MS: moves all extremities without noticeable abnormality  PSYCH: pleasant and cooperative, no obvious depression or anxiety  ASSESSMENT AND PLAN:  Discussed the following assessment and plan:  Acute suppurative otitis media of left ear without spontaneous rupture of tympanic membrane, recurrence not specified  Allergic rhinitis, unspecified allergic rhinitis type  -Suspect uncontrolled allergic rhinitis, with secondary left otitis media -Opted to treat with antibiotics after discussion of risks and regular use of his allergy regimen -He is advised follow-up if any symptoms persist despite treatment -Patient advised to return or notify a doctor immediately if symptoms worsen or persist or new concerns arise.  Patient Instructions  Please take the antibiotic as instructed. -As we discussed, we have prescribed a new medication for you at this appointment. We discussed the common and serious potential adverse effects of this medication and you can review these and more with the pharmacist when you pick up your medication.  Please follow the instructions for use carefully and notify us immediately if you have any problems taking this medication.  During the allergy season, please use her Nasonex on a regular basis.  Please follow up if your symptoms persist despite treatment.     Colin Benton R.

## 2016-01-10 NOTE — Patient Instructions (Signed)
Please take the antibiotic as instructed. -As we discussed, we have prescribed a new medication for you at this appointment. We discussed the common and serious potential adverse effects of this medication and you can review these and more with the pharmacist when you pick up your medication.  Please follow the instructions for use carefully and notify us immediately if you have any problems taking this medication.  During the allergy season, please use her Nasonex on a regular basis.  Please follow up if your symptoms persist despite treatment.

## 2016-02-04 ENCOUNTER — Ambulatory Visit (INDEPENDENT_AMBULATORY_CARE_PROVIDER_SITE_OTHER): Payer: BLUE CROSS/BLUE SHIELD | Admitting: Family Medicine

## 2016-02-04 ENCOUNTER — Encounter: Payer: Self-pay | Admitting: Family Medicine

## 2016-02-04 VITALS — BP 120/72 | HR 84 | Temp 98.5°F | Ht 73.25 in | Wt 286.2 lb

## 2016-02-04 DIAGNOSIS — K5190 Ulcerative colitis, unspecified, without complications: Secondary | ICD-10-CM | POA: Diagnosis not present

## 2016-02-04 DIAGNOSIS — I10 Essential (primary) hypertension: Secondary | ICD-10-CM | POA: Diagnosis not present

## 2016-02-04 DIAGNOSIS — Z0000 Encounter for general adult medical examination without abnormal findings: Secondary | ICD-10-CM | POA: Diagnosis not present

## 2016-02-04 DIAGNOSIS — E785 Hyperlipidemia, unspecified: Secondary | ICD-10-CM

## 2016-02-04 DIAGNOSIS — F3342 Major depressive disorder, recurrent, in full remission: Secondary | ICD-10-CM

## 2016-02-04 NOTE — Patient Instructions (Addendum)
BEFORE YOU LEAVE: -schedule lab visit for fasting labs sometime in the next few weeks -schedule follow up with Dr. Maudie Mercury in 6 months  Taper off of the lexapro if you wish. 1/2 tablet for 1-2 weeks, then 1/2 tablet every other day for 1-2 weeks and then stop. Follow up if worsening symptoms with stopping or if you wish to restart.  We recommend the following healthy lifestyle measures: - eat a healthy whole foods diet consisting of regular small meals composed of vegetables, fruits, beans, nuts, seeds, healthy meats such as white chicken and fish and whole grains.  - avoid sweets, white starchy foods, fried foods, fast food, processed foods, sodas, red meet and other fattening foods.  - get a least 150-300 minutes of aerobic exercise per week.   We have ordered labs or studies at this visit. It can take up to 1-2 weeks for results and processing. IF results require follow up or explanation, we will call you with instructions. Clinically stable results will be released to your [facility]. If you have not heard from [place] or cannot find your results in [facility] in 2 weeks please contact our office at [phone].  If you are not yet signed up for [facility], please consider signing up.  FOR IMPROVED SLEEP AND TO RESET YOUR SLEEP SCHEDULE: []  exercise 30 minutes daily  []  go to bed and wake up at the same time  []  keep bedroom cool, dark and quiet  []  reserve bed for sleep - do not read, watch TV, etc in bed  []  If you toss and turn more then 15-20 minutes get out of bed and list thoughts/do quite activity then go back to bed; repeat as needed; do not worry about when you eventually fall asleep - still get up at the same time and turn on lights and take shower  [] get counseling  []  some people find that a half dose of benadryl, melatonin, tylenol pm or unisom on a few nights per week is helpful initially for a few weeks  [] seek help and treat any depression or anxiety  [] prescription strength sleep  medications should only be used in severe cases of insomnia if other measures fail and should be used sparingly

## 2016-02-04 NOTE — Progress Notes (Signed)
Pre visit review using our clinic review tool, if applicable. No additional management support is needed unless otherwise documented below in the visit note. 

## 2016-02-04 NOTE — Progress Notes (Signed)
HPI:  Here for CPE:  -Concerns and/or follow up today:   Depression and anxiety: -reports intermittent mild depression his whole life, worsening 08/2015  -prior PCP put him on wellbutrin which made symptoms worse --> changed to lexapro 09/02/15 and advised CBT with Dr. Glennon Hamilton -today reports: doing "much better, great!"; saw Dr. Glennon Hamilton  -he reports he is gay, has good relationships and good friends  -he drinks alcohol occasionally, smokes weed occasionally and has taken a klonopin a few times from a friend -he denies any other drug use -he denies: manic symptoms, hallucinations, current SI or thoughts of self harm or harm to others -he feels like he has been doing well for sometime and would like to try to taper off of the medication -occasional sleep issues; used to take [place] but risks of side effects concerned him  HTN: -meds: asa, amlodipine-benazapril 10-40 -denies: CP, SOB, DOE  HLD/Obesity/OSA: -labs showed elevated cholesterol 10/2014; improved on crestor -reports: doing well on the crestor 71m -denies: statin intolerance  -uses CPAP -diet and exercise: walking 3 days per week for 30-40 minutes; feels like diet is improved, has lost 15 lbs in 10 months  Testosterone def/hypogonadism/ED: -referred to urology for evaluation and management as had no symptoms off testosterone therapy when I first saw him - doing trial off testosterone and feels fine -denies decreased libido, hair loss, gynecomastia, poor energy, urinary symptoms, hx CV disease, LUTS  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 StFuller Plan-he did finally get his colonoscopy 01/2015- with multiple abnormalities and per recent GI notes due for repeat colonoscopy in 2018 -Denies: abd pain, fevers, blood in stools   -Diabetes and Dyslipidemia Screening: not fasting  -Hx of HTN: no  -Vaccines: UTD  -wants STI testing, Hep C screening  (if born [birth year] no  -FH colon or prostate ca: see FH Last colon cancer screening: see above, utd Last prostate ca screening: discussed risks/benefits; he opted to wait on this until he sees   -saw dermatologist for skin exam, declined today  -Alcohol, Tobacco, drug use: see social history  Review of Systems - no fevers, unintentional weight loss, vision loss, hearing loss, chest pain, sob, hemoptysis, melena, hematochezia, hematuria, genital discharge, changing or concerning skin lesions, bleeding, bruising, loc, thoughts of self harm or SI  Past Medical History  Diagnosis Date  . HYPOGONADISM, MALE 07/24/2008  . HYPERLIPIDEMIA 05/28/2007  . HYPERTENSION 05/28/2007  . ALLERGIC RHINITIS CAUSE UNSPECIFIED 05/21/2009  . COLITIS, ULCERATIVE 05/28/2007  . 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 BACK PAIN, LUMBAR, WITH RADICULOPATHY 03/18/2010  . SLEEP APNEA 12/18/2008    wears CPAP  . Tubular adenoma of colon   . Internal hemorrhoids   . Glaucoma   . Rec[place] major [place]    with GAD     Past Surgical History  Procedure Laterality Date  . Nasal sinus surgery    . Colonoscopy    . Polypectomy      Family History  Problem Relation Age of Onset  . Colon cancer Neg Hx   . Esophageal cancer Neg Hx   . Stomach cancer Neg Hx   . Rectal cancer Neg Hx   . Hyperlipidemia Father   . Hypertension Father   . Heart disease Father   . Breast cancer Mother     <50  . Ulcerative colitis Cousin     2 cousins paternal side  Social History   Social History  . Marital Status: Single    Spouse Name: N/A  . Number of Children: 0  . Years of Education: N/A   Occupational History  . Realtor   .     Social History Main Topics  . Smoking status: Former Smoker    Types: Cigarettes    Quit date: 10/20/2009  . Smokeless tobacco: Never Used  . Alcohol Use: 4.2 oz/week    7 Standard drinks or equivalent per week     Comment: 1 drink per  day at most  . Drug Use: No  . Sexual Activity: Not Asked   Other Topics Concern  . None   Social History Narrative   Work or School: Chartered certified accountant Situation: lives alone      Spiritual Beliefs: none      Lifestyle: walks some; diet is not great           Current outpatient prescriptions:  .  amLODipine-benazepril (LOTREL) 10-40 MG capsule, TAKE 1 CAPSULE BY MOUTH DAILY., Disp: 30 capsule, Rfl: 5 .  escitalopram (LEXAPRO) 10 MG tablet, TAKE 1 TABLET (10 MG TOTAL) BY MOUTH DAILY., Disp: 30 tablet, Rfl: 4 .  finasteride (PROPECIA) 1 MG tablet, TAKE 1 TABLET (1 MG TOTAL) BY MOUTH DAILY., Disp: 30 tablet, Rfl: 0 .  Multiple Vitamins-Iron (MULTIVITAMIN/IRON) TABS, Take 1 tablet by mouth daily.  , Disp: , Rfl:  .  NASONEX 50 MCG/ACT nasal spray, PLACE 2 SQUIRTS IN EACH NOSTRIL DAILY (Patient taking differently: PLACE 2 SQUIRTS IN EACH NOSTRIL AS NEEDED), Disp: 17 g, Rfl: 11 .  rosuvastatin (CRESTOR) 20 MG tablet, Take 1 tablet (20 mg total) by mouth daily., Disp: 90 tablet, Rfl: 3 .  sulfaSALAzine (AZULFIDINE) 500 MG EC tablet, TAKE 3 TABLETS BY MOUTH 2 TIMES DAILY., Disp: 180 tablet, Rfl: 11 .  triamcinolone (KENALOG) 0.025 % cream, Apply 1 application topically 2 (two) times daily., Disp: 30 g, Rfl: 0  EXAM:  Filed Vitals:   02/04/16 0830  BP: 120/72  Pulse: 84  Temp: 98.5 F (36.9 C)  TempSrc: Oral  Height: 6' 1.25" (1.861 m)  Weight: 286 lb 3.2 oz (129.819 kg)    Estimated body mass index is 37.48 kg/(m^2) as calculated from the following:   Height as of this encounter: 6' 1.25" (1.861 m).   Weight as of this encounter: 286 lb 3.2 oz (129.819 kg).  GENERAL: vitals reviewed and listed below, alert, oriented, appears well hydrated and in no acute distress  HEENT: head atraumatic, PERRLA, normal appearance of eyes, ears, nose and mouth. moist mucus membranes.  NECK: supple, no masses or lymphadenopathy  LUNGS: clear to auscultation bilaterally, no rales,  rhonchi or wheeze  CV: HRRR, no peripheral edema or cyanosis, normal pedal pulses  ABDOMEN: bowel sounds normal, soft, non tender to palpation, no masses, no rebound or guarding  GU: declined  SKIN: no rash or abnormal lesions, declined full exam  MS: normal gait, moves all extremities normally  NEURO: CN II-XII grossly intact, normal muscle strength and sensation to light touch on extremities  PSYCH: normal affect, pleasant and cooperative  ASSESSMENT AND PLAN:  Discussed the following assessment and plan:  Visit for preventive health examination - Plan: Hemoglobin A1c, CBC with Differential/Platelets  Hyperlipemia - Plan: Lipid Panel  Essential hypertension - Plan: Basic metabolic panel  Ulcerative colitis without complications, unspecified location (HCC)  Recurrent major depressive disorder, in full remission ([place])   -Discussed  and advised all [place] preventive services health task force level A and B recommendations for age, sex and risks.  -Advised at least 150 minutes of exercise per week and a healthy diet low in saturated fats and sweets and consisting of fresh fruits and vegetables, lean meats such as fish and white chicken and whole grains.  -labs, studies and vaccines per orders this encounter   Patient advised to return to clinic immediately if symptoms worsen or persist or new concerns.  Patient Instructions  BEFORE YOU LEAVE: -schedule lab visit for fasting labs sometime in the next few weeks -schedule follow up with Dr. Maudie Mercury in 6 months  Taper off of the lexapro if you wish. 1/2 tablet for 1-2 weeks, then 1/2 tablet every other day for 1-2 weeks and then stop. Follow up if worsening symptoms with stopping or if you wish to restart.  We recommend the following healthy lifestyle measures: - eat a healthy whole foods diet consisting of regular small meals composed of vegetables, fruits, beans, nuts, seeds, healthy meats such as white chicken and fish and whole  grains.  - avoid sweets, white starchy foods, fried foods, fast food, processed foods, sodas, red meet and other fattening foods.  - get a least 150-300 minutes of aerobic exercise per week.   We have ordered labs or studies at this visit. It can take up to 1-2 weeks for results and processing. IF results require follow up or explanation, we will call you with instructions. Clinically stable results will be released to your [facility]. If you have not heard from [place] or cannot find your results in [facility] in 2 weeks please contact our office at [phone].  If you are not yet signed up for [facility], please consider signing up.  FOR IMPROVED SLEEP AND TO RESET YOUR SLEEP SCHEDULE: []  exercise 30 minutes daily  []  go to bed and wake up at the same time  []  keep bedroom cool, dark and quiet  []  reserve bed for sleep - do not read, watch TV, etc in bed  []  If you toss and turn more then 15-20 minutes get out of bed and list thoughts/do quite activity then go back to bed; repeat as needed; do not worry about when you eventually fall asleep - still get up at the same time and turn on lights and take shower  [] get counseling  []  some people find that a half dose of benadryl, melatonin, tylenol pm or unisom on a few nights per week is helpful initially for a few weeks  [] seek help and treat any depression or anxiety  [] prescription strength sleep medications should only be used in severe cases of insomnia if other measures fail and should be used sparingly         No Follow-up on file.   Richard [name] R.

## 2016-02-10 ENCOUNTER — Other Ambulatory Visit: Payer: Self-pay

## 2016-02-11 ENCOUNTER — Other Ambulatory Visit (INDEPENDENT_AMBULATORY_CARE_PROVIDER_SITE_OTHER): Payer: BLUE CROSS/BLUE SHIELD

## 2016-02-11 DIAGNOSIS — E785 Hyperlipidemia, unspecified: Secondary | ICD-10-CM

## 2016-02-11 DIAGNOSIS — Z0000 Encounter for general adult medical examination without abnormal findings: Secondary | ICD-10-CM | POA: Diagnosis not present

## 2016-02-11 DIAGNOSIS — I10 Essential (primary) hypertension: Secondary | ICD-10-CM

## 2016-02-11 LAB — CBC WITH DIFFERENTIAL/PLATELET
BASOS ABS: 0 10*3/uL (ref 0.0–0.1)
Basophils Relative: 0.5 % (ref 0.0–3.0)
EOS PCT: 2.3 % (ref 0.0–5.0)
Eosinophils Absolute: 0.2 10*3/uL (ref 0.0–0.7)
HCT: 44.6 % (ref 39.0–52.0)
HEMOGLOBIN: 14.9 g/dL (ref 13.0–17.0)
Lymphocytes Relative: 21.8 % (ref 12.0–46.0)
Lymphs Abs: 1.4 10*3/uL (ref 0.7–4.0)
MCHC: 33.5 g/dL (ref 30.0–36.0)
MCV: 91.3 fl (ref 78.0–100.0)
MONO ABS: 0.5 10*3/uL (ref 0.1–1.0)
MONOS PCT: 7.4 % (ref 3.0–12.0)
Neutro Abs: 4.5 10*3/uL (ref 1.4–7.7)
Neutrophils Relative %: 68 % (ref 43.0–77.0)
Platelets: 232 10*3/uL (ref 150.0–400.0)
RBC: 4.88 Mil/uL (ref 4.22–5.81)
RDW: 13.6 % (ref 11.5–15.5)
WBC: 6.6 10*3/uL (ref 4.0–10.5)

## 2016-02-11 LAB — LIPID PANEL
CHOL/HDL RATIO: 4
Cholesterol: 189 mg/dL (ref 0–200)
HDL: 49.4 mg/dL (ref >39.00)
LDL CALC: 115 mg/dL — AB (ref 0–99)
NONHDL: 139.36
Triglycerides: 120 mg/dL (ref 0.0–149.0)
VLDL: 24 mg/dL (ref 0.0–40.0)

## 2016-02-11 LAB — BASIC METABOLIC PANEL
BUN: 17 mg/dL (ref 6–23)
CHLORIDE: 102 meq/L (ref 96–112)
CO2: 26 mEq/L (ref 19–32)
Calcium: 9.2 mg/dL (ref 8.4–10.5)
Creatinine, Ser: 0.77 mg/dL (ref 0.40–1.50)
GFR: 110.24 mL/min (ref >60.00)
Glucose, Bld: 85 mg/dL (ref 70–99)
POTASSIUM: 3.8 meq/L (ref 3.5–5.1)
Sodium: 137 mEq/L (ref 135–145)

## 2016-02-11 LAB — HEMOGLOBIN A1C: HEMOGLOBIN A1C: 5.1 % (ref 4.6–6.5)

## 2016-03-21 ENCOUNTER — Other Ambulatory Visit: Payer: Self-pay | Admitting: Family Medicine

## 2016-04-04 ENCOUNTER — Other Ambulatory Visit: Payer: Self-pay | Admitting: Family Medicine

## 2016-04-17 ENCOUNTER — Other Ambulatory Visit: Payer: Self-pay | Admitting: Family Medicine

## 2016-05-05 ENCOUNTER — Other Ambulatory Visit: Payer: Self-pay | Admitting: Family Medicine

## 2016-06-21 ENCOUNTER — Other Ambulatory Visit: Payer: Self-pay | Admitting: Family Medicine

## 2016-07-03 ENCOUNTER — Other Ambulatory Visit: Payer: Self-pay | Admitting: Family Medicine

## 2016-07-07 ENCOUNTER — Telehealth: Payer: Self-pay | Admitting: *Deleted

## 2016-07-07 MED ORDER — FINASTERIDE 1 MG PO TABS: ORAL_TABLET | ORAL | 3 refills | Status: DC

## 2016-07-07 NOTE — Addendum Note (Signed)
Addended by: Agnes Lawrence on: 07/07/2016 05:03 PM   Modules accepted: Orders

## 2016-07-07 NOTE — Telephone Encounter (Signed)
Rx done. 

## 2016-07-07 NOTE — Telephone Encounter (Signed)
[facility] faxed a request for a 90 day supply of Finasteride 26m #90-take 1 by mouth daily.

## 2016-07-07 NOTE — Telephone Encounter (Signed)
Ok to send 90 days with 3 refills. Thanks.

## 2016-07-20 ENCOUNTER — Ambulatory Visit (INDEPENDENT_AMBULATORY_CARE_PROVIDER_SITE_OTHER): Payer: BLUE CROSS/BLUE SHIELD

## 2016-07-20 DIAGNOSIS — Z23 Encounter for immunization: Secondary | ICD-10-CM | POA: Diagnosis not present

## 2016-08-06 NOTE — Progress Notes (Signed)
HPI:  Richard [name] is a pleasant 60 yo with a PMH significant HTN, HLD, Obesity, MDD, GAD and ulcerative colitis here for follow up. See summary of major medical problems below. He reports he is doing quite well and is without any new complaints. He get some exercise walking several days per week. He admits his diet is not very good. Denies exertional chest pain, shortness of breath, depression, anxiety or bowel issues. He plans to follow-up with his gastroenterologist in for another colonoscopy. He wants to continue his antidepressant.  Due for BP labs.  Depression and anxiety: -reports intermittent mild depression his whole life, worsening 08/2015  -prior PCP put him on wellbutrin which made symptoms worse --> changed to lexapro 09/02/15  -has had CBT with Dr. Glennon [name] -he reports he is gay, has good relationships and good friends  -he has hx of alcohol occasionally, smokes weed occasionally and has taken a klonopin a few times from a friend -hx of occasional sleep issues; used to take [place] but risks of side effects concerned him  HTN: -meds: asa, amlodipine-benazapril 10-40  HLD/Obesity/OSA: -labs showed elevated cholesterol 10/2014; improved on crestor -uses CPAP  Testosterone def/hypogonadism/ED: -referred to urology for evaluation and management as had no symptoms off testosterone therapy when I first saw him - doing trial off testosterone with urologist  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  Past Medical History:  Diagnosis Date  . ALLERGIC RHINITIS CAUSE UNSPECIFIED 05/21/2009  . BACK PAIN, LUMBAR, WITH RADICULOPATHY 03/18/2010  . COLITIS, ULCERATIVE 05/28/2007  . Glaucoma   . HYPERLIPIDEMIA 05/28/2007  . HYPERTENSION 05/28/2007  .  HYPOGONADISM, MALE 07/24/2008  . Internal hemorrhoids   . 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 [place]   with GAD   . SLEEP APNEA 12/18/2008   wears CPAP  . Tubular adenoma of colon     Past Surgical History:  Procedure Laterality Date  . COLONOSCOPY    . NASAL SINUS SURGERY    . POLYPECTOMY      Family History  Problem Relation Age of Onset  . Colon cancer Neg Hx   . Esophageal cancer Neg Hx   . Stomach cancer Neg Hx   . Rectal cancer Neg Hx   . Hyperlipidemia Father   . Hypertension Father   . Heart disease Father   . Breast cancer Mother     <50  . Ulcerative colitis Cousin     2 cousins paternal side    Social History   Social History  . Marital status: Single    Spouse name: N/A  . Number of children: 0  . Years of education: N/A   Occupational History  . Realtor   .  [employer]  Social History Main Topics  . Smoking status: Former Smoker    Types: Cigarettes    Quit date: 10/20/2009  . Smokeless tobacco: Never Used  . Alcohol use 4.2 oz/week    7 Standard drinks or equivalent per week     Comment: 1 drink per day at most  . Drug use: No  . Sexual activity: Not Asked   Other Topics Concern  . 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  amLODipine-benazepril (LOTREL) 10-40 MG capsule, TAKE 1 CAPSULE BY MOUTH DAILY., Disp: 30 capsule, Rfl: 5 .  escitalopram (LEXAPRO) 10 MG tablet, TAKE 1 TABLET BY MOUTH DAILY., Disp: 30 tablet, Rfl: 4 .  finasteride (PROPECIA) 1 MG tablet, TAKE 1 TABLET (1 MG TOTAL) BY MOUTH DAILY., Disp: 90 tablet, Rfl: 3 .  Multiple Vitamins-Iron (MULTIVITAMIN/IRON) TABS, Take 1 tablet by mouth daily.  , Disp: , Rfl:  .  NASONEX 50 MCG/ACT nasal spray, PLACE 2 SQUIRTS IN EACH  NOSTRIL DAILY (Patient taking differently: PLACE 2 SQUIRTS IN EACH NOSTRIL AS NEEDED), Disp: 17 g, Rfl: 11 .  rosuvastatin (CRESTOR) 20 MG tablet, TAKE 1 TABLET (20 MG TOTAL) BY MOUTH DAILY., Disp: 90 tablet, Rfl: 2 .  sulfaSALAzine (AZULFIDINE) 500 MG EC tablet, TAKE 3 TABLETS BY MOUTH 2 TIMES DAILY., Disp: 180 tablet, Rfl: 11 .  triamcinolone (KENALOG) 0.025 % cream, Apply 1 application topically 2 (two) times daily., Disp: 30 g, Rfl: 0  EXAM:  Vitals:   08/07/16 1025  BP: 124/76  Pulse: 87  Temp: 98.1 F (36.7 C)    Body mass index is 39.85 kg/m.  GENERAL: vitals reviewed and listed above, alert, oriented, appears well hydrated and in no acute distress  HEENT: atraumatic, conjunttiva clear, no obvious abnormalities on inspection of external nose and ears  NECK: no obvious masses on inspection  LUNGS: clear to auscultation bilaterally, no wheezes, rales or rhonchi, good air movement  CV: HRRR, no peripheral edema  MS: moves all extremities without noticeable abnormality  PSYCH: pleasant and cooperative, no obvious depression or anxiety  ASSESSMENT AND PLAN:  Discussed the following assessment and plan:  Recurrent major depressive disorder, in full remission ([place])  Essential hypertension - Plan: Basic metabolic panel, CBC (no diff)  Obesity without serious comorbidity, unspecified classification, unspecified obesity type  Hair loss  -Labs today -continue medications -Lifestyle recommendations -Patient advised to return or notify a doctor immediately if symptoms worsen or persist or new concerns arise.  Patient Instructions  BEFORE YOU LEAVE: -follow up: 3-4 months -labs  We have ordered labs or studies at this visit. It can take up to 1-2 weeks for results and processing. IF results require follow up or explanation, we will call you with instructions. Clinically stable results will be released to your [facility]. If you have not heard from [place] or cannot find your  results in [facility] in 2 weeks please contact our office at [phone].  If you are not yet signed up for [facility], please consider signing up.   We recommend the following healthy lifestyle for LIFE: 1) Small portions.   Tip: eat off of a salad plate instead of a dinner plate.  Tip: if you need more or a snack choose fruits, veggies and/or a handful of nuts or seeds.  2) Eat a healthy clean diet.  * Tip: Avoid (less then 1 serving per week): processed foods, sweets, sweetened drinks, white starches (rice, flour, bread, potatoes, pasta, etc), red meat, fast foods, butter  *Tip: CHOOSE instead   * 5-9 servings per day of fresh or frozen fruits and vegetables (but not corn, potatoes, bananas, canned or dried fruit)   *nuts and seeds, beans   *olives and olive oil   *small portions of lean  meats such as fish and white chicken    *small portions of whole grains  3)Get at least 150 minutes of sweaty aerobic exercise per week.  4)Reduce stress - consider counseling, meditation and relaxation to balance other aspects of your life.            Colin Benton R., DO

## 2016-08-07 ENCOUNTER — Ambulatory Visit (INDEPENDENT_AMBULATORY_CARE_PROVIDER_SITE_OTHER): Payer: BLUE CROSS/BLUE SHIELD | Admitting: Family Medicine

## 2016-08-07 ENCOUNTER — Encounter: Payer: Self-pay | Admitting: Family Medicine

## 2016-08-07 VITALS — BP 124/76 | HR 87 | Temp 98.1°F | Ht 73.25 in | Wt 304.1 lb

## 2016-08-07 DIAGNOSIS — I10 Essential (primary) hypertension: Secondary | ICD-10-CM

## 2016-08-07 DIAGNOSIS — L659 Nonscarring hair loss, unspecified: Secondary | ICD-10-CM

## 2016-08-07 DIAGNOSIS — F3342 Major depressive disorder, recurrent, in full remission: Secondary | ICD-10-CM

## 2016-08-07 DIAGNOSIS — E669 Obesity, unspecified: Secondary | ICD-10-CM | POA: Diagnosis not present

## 2016-08-07 LAB — BASIC METABOLIC PANEL
BUN: 12 mg/dL (ref 6–23)
CALCIUM: 9.1 mg/dL (ref 8.4–10.5)
CO2: 28 mEq/L (ref 19–32)
CREATININE: 0.8 mg/dL (ref 0.40–1.50)
Chloride: 104 mEq/L (ref 96–112)
GFR: 105.31 mL/min (ref >60.00)
Glucose, Bld: 126 mg/dL — ABNORMAL HIGH (ref 70–99)
Potassium: 4 mEq/L (ref 3.5–5.1)
Sodium: 140 mEq/L (ref 135–145)

## 2016-08-07 LAB — CBC
HCT: 44 % (ref 39.0–52.0)
Hemoglobin: 15 g/dL (ref 13.0–17.0)
MCHC: 34.1 g/dL (ref 30.0–36.0)
MCV: 90.3 fl (ref 78.0–100.0)
Platelets: 242 10*3/uL (ref 150.0–400.0)
RBC: 4.87 Mil/uL (ref 4.22–5.81)
RDW: 13.4 % (ref 11.5–15.5)
WBC: 7 10*3/uL (ref 4.0–10.5)

## 2016-08-07 NOTE — Progress Notes (Signed)
Pre visit review using our clinic review tool, if applicable. No additional management support is needed unless otherwise documented below in the visit note. 

## 2016-08-07 NOTE — Patient Instructions (Signed)
BEFORE YOU LEAVE: -follow up: 3-4 months -labs  We have ordered labs or studies at this visit. It can take up to 1-2 weeks for results and processing. IF results require follow up or explanation, we will call you with instructions. Clinically stable results will be released to your [facility]. If you have not heard from [place] or cannot find your results in [facility] in 2 weeks please contact our office at [phone].  If you are not yet signed up for [facility], please consider signing up.   We recommend the following healthy lifestyle for LIFE: 1) Small portions.   Tip: eat off of a salad plate instead of a dinner plate.  Tip: if you need more or a snack choose fruits, veggies and/or a handful of nuts or seeds.  2) Eat a healthy clean diet.  * Tip: Avoid (less then 1 serving per week): processed foods, sweets, sweetened drinks, white starches (rice, flour, bread, potatoes, pasta, etc), red meat, fast foods, butter  *Tip: CHOOSE instead   * 5-9 servings per day of fresh or frozen fruits and vegetables (but not corn, potatoes, bananas, canned or dried fruit)   *nuts and seeds, beans   *olives and olive oil   *small portions of lean meats such as fish and white chicken    *small portions of whole grains  3)Get at least 150 minutes of sweaty aerobic exercise per week.  4)Reduce stress - consider counseling, meditation and relaxation to balance other aspects of your life.

## 2016-08-28 ENCOUNTER — Other Ambulatory Visit: Payer: Self-pay | Admitting: Family Medicine

## 2016-11-01 ENCOUNTER — Other Ambulatory Visit: Payer: Self-pay | Admitting: Gastroenterology

## 2016-11-28 ENCOUNTER — Ambulatory Visit (INDEPENDENT_AMBULATORY_CARE_PROVIDER_SITE_OTHER): Payer: BLUE CROSS/BLUE SHIELD | Admitting: Family Medicine

## 2016-11-28 ENCOUNTER — Encounter: Payer: Self-pay | Admitting: Family Medicine

## 2016-11-28 VITALS — BP 136/72 | HR 86 | Temp 98.9°F | Ht 73.25 in | Wt 296.4 lb

## 2016-11-28 DIAGNOSIS — I10 Essential (primary) hypertension: Secondary | ICD-10-CM

## 2016-11-28 DIAGNOSIS — F3342 Major depressive disorder, recurrent, in full remission: Secondary | ICD-10-CM | POA: Diagnosis not present

## 2016-11-28 DIAGNOSIS — J069 Acute upper respiratory infection, unspecified: Secondary | ICD-10-CM

## 2016-11-28 DIAGNOSIS — Z6838 Body mass index (BMI) 38.0-38.9, adult: Secondary | ICD-10-CM

## 2016-11-28 DIAGNOSIS — E785 Hyperlipidemia, unspecified: Secondary | ICD-10-CM

## 2016-11-28 DIAGNOSIS — H6692 Otitis media, unspecified, left ear: Secondary | ICD-10-CM

## 2016-11-28 MED ORDER — AMOXICILLIN-POT CLAVULANATE 875-125 MG PO TABS: 1.0000 | ORAL_TABLET | Freq: Two times a day (BID) | ORAL | 0 refills | Status: DC

## 2016-11-28 NOTE — Progress Notes (Signed)
HPI:  Acute visit for URI: -started: about 2 weeks ago -symptoms:nasal congestion, sore throat, cough, PND - now with some L ear pressure and persistent PND -denies:fever, SOB, NVD, tooth pain, sinus pain, ear pain, bodyaches -has tried: nasonex -sick contacts/travel/risks: no reported flu, strep or tick exposure -Hx of: allergies  Had follow up chronic disease next week, wonders if can cancel that appt. Reports feel great o/w. No depression, SI, CP, SOB, DOE, frequent headaches, swelling or other concerns.  ROS: See pertinent positives and negatives per HPI.  Past Medical History:  Diagnosis Date  . ALLERGIC RHINITIS CAUSE UNSPECIFIED 05/21/2009  . BACK PAIN, LUMBAR, WITH RADICULOPATHY 03/18/2010  . COLITIS, ULCERATIVE 05/28/2007  . Glaucoma   . HYPERLIPIDEMIA 05/28/2007  . HYPERTENSION 05/28/2007  . HYPOGONADISM, MALE 07/24/2008  . Internal hemorrhoids   . 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 Recurrent major depressive disorder, in full remission ([place]) 10/07/2015   with GAD   . SLEEP APNEA 12/18/2008   wears CPAP  . Tubular adenoma of colon     Past Surgical History:  Procedure Laterality Date  . COLONOSCOPY    . NASAL SINUS SURGERY    . POLYPECTOMY      Family History  Problem Relation Age of Onset  . Colon cancer Neg Hx   . Esophageal cancer Neg Hx   . Stomach cancer Neg Hx   . Rectal cancer Neg Hx   . Hyperlipidemia Father   . Hypertension Father   . Heart disease Father   . Breast cancer Mother     <50  . Ulcerative colitis Cousin     2 cousins paternal side    Social History   Social History  . Marital status: Single    Spouse name: N/A  . Number of children: 0  . Years of education: N/A   Occupational History  . Realtor   .  [employer]   Social History Main Topics  . Smoking status: Former Smoker    Types: Cigarettes    Quit date: 10/20/2009  . Smokeless tobacco: Never Used  . Alcohol use 4.2 oz/week    7 Standard drinks or  equivalent per week     Comment: 1 drink per day at most  . Drug use: No  . Sexual activity: Not Asked   Other Topics Concern  . None   Social History Narrative   Work or School: Chartered certified accountant Situation: lives alone      Spiritual Beliefs: none      Lifestyle: walks some; diet is not great           Current Outpatient Prescriptions:  .  amLODipine-benazepril (LOTREL) 10-40 MG capsule, TAKE 1 CAPSULE BY MOUTH DAILY., Disp: 30 capsule, Rfl: 5 .  escitalopram (LEXAPRO) 10 MG tablet, TAKE 1 TABLET BY MOUTH DAILY., Disp: 30 tablet, Rfl: 4 .  finasteride (PROPECIA) 1 MG tablet, TAKE 1 TABLET (1 MG TOTAL) BY MOUTH DAILY., Disp: 90 tablet, Rfl: 3 .  Multiple Vitamins-Iron (MULTIVITAMIN/IRON) TABS, Take 1 tablet by mouth daily.  , Disp: , Rfl:  .  NASONEX 50 MCG/ACT nasal spray, PLACE 2 SQUIRTS IN EACH NOSTRIL DAILY (Patient taking differently: PLACE 2 SQUIRTS IN EACH NOSTRIL AS NEEDED), Disp: 17 g, Rfl: 11 .  rosuvastatin (CRESTOR) 20 MG tablet, TAKE 1 TABLET (20 MG TOTAL) BY MOUTH DAILY., Disp: 90 tablet, Rfl: 2 .  sulfaSALAzine (AZULFIDINE) 500 MG EC tablet, TAKE 3 TABLETS BY  MOUTH 2 TIMES DAILY., Disp: 180 tablet, Rfl: 0 .  triamcinolone (KENALOG) 0.025 % cream, Apply 1 application topically 2 (two) times daily., Disp: 30 g, Rfl: 0 .  amoxicillin-clavulanate (AUGMENTIN) 875-125 MG tablet, Take 1 tablet by mouth 2 (two) times daily., Disp: 14 tablet, Rfl: 0  EXAM:  Vitals:   11/28/16 1444  BP: 136/72  Pulse: 86  Temp: 98.9 F (37.2 C)    Body mass index is 38.84 kg/m.  GENERAL: vitals reviewed and listed above, alert, oriented, appears well hydrated and in no acute distress  HEENT: atraumatic, conjunttiva clear, no obvious abnormalities on inspection of external nose and ears, normal appearance of ear canals and TMs except for mildly discolored effusion with mild bulging TM on L, thick nasal congestion, mild post oropharyngeal erythema with PND, no tonsillar edema or  exudate, no sinus TTP  NECK: no obvious masses on inspection  LUNGS: clear to auscultation bilaterally, no wheezes, rales or rhonchi, good air movement  CV: HRRR, no peripheral edema  MS: moves all extremities without noticeable abnormality  PSYCH: pleasant and cooperative, no obvious depression or anxiety  ASSESSMENT AND PLAN:  Discussed the following assessment and plan:  Upper respiratory tract infection, unspecified type -given HPI and exam findings today, a serious infection or illness is unlikely. We discussed potential etiologies, with VURI or AR with possible developing sinusitis and L OM being most likely. Opted for allergy regimen, nasal saline and abx if not improving or if worsening. We discussed treatment side effects, likely course, antibiotic misuse, transmission, and signs of developing a serious illness. -of course, we advised to return or notify a doctor immediately if symptoms worsen or persist or new concerns arise.  Essential hypertension -a little high today, monitor, lifestyle recs -recheck at follow up  Hyperlipidemia, unspecified hyperlipidemia type -stable, lifestyle recs  Recurrent major depressive disorder, in full remission ([place]) -doing well, cont current tx  BMI 38.0-38.9,adult -lifestyle recs     Patient Instructions  BEFORE YOU LEAVE: -follow up: cancel follow up in next few weeks - schedule follow up in 3 months and we will plan to check labs then  Nasal saline twice daily  nasonex and zyrtec daily   Take the antibiotic if worsening or not improving  I hope you are feeling better soon! Seek care immediately if worsening, new concerns or you are not improving with treatment.   WE NOW OFFER   [facility] FAST TRACK!!!  SAME DAY Appointments for ACUTE CARE  Such as: Sprains, Injuries, cuts, abrasions, rashes, muscle pain, joint pain, back pain Colds, flu, sore throats, headache, allergies, cough, fever  Ear pain, sinus  and eye infections Abdominal pain, nausea, vomiting, diarrhea, upset stomach Animal/insect bites  3 Easy Ways to Schedule: Walk-In Scheduling Call in scheduling Mychart Sign-up: [email or web address]     We recommend the following healthy lifestyle for LIFE: 1) Small portions.   Tip: eat off of a salad plate instead of a dinner plate.  Tip: It is ok to feel hungry after a meal - that likely means you ate an appropriate portion.  Tip: if you need more or a snack choose fruits, veggies and/or a handful of nuts or seeds.  2) Eat a healthy clean diet.  * Tip: Avoid (less then 1 serving per week): processed foods, sweets, sweetened drinks, white starches (rice, flour, bread, potatoes, pasta, etc), red meat, fast foods, butter  *Tip: CHOOSE instead   * 5-9 servings per day of fresh or frozen fruits and  vegetables (but not corn, potatoes, bananas, canned or dried fruit)   *nuts and seeds, beans   *olives and olive oil   *small portions of lean meats such as fish and white chicken    *small portions of whole grains  3)Get at least 150 minutes of sweaty aerobic exercise per week.  4)Reduce stress - consider counseling, meditation and relaxation to balance other aspects of your life.         Colin Benton R., DO

## 2016-11-28 NOTE — Progress Notes (Signed)
Pre visit review using our clinic review tool, if applicable. No additional management support is needed unless otherwise documented below in the visit note. 

## 2016-11-28 NOTE — Patient Instructions (Addendum)
BEFORE YOU LEAVE: -follow up: cancel follow up in next few weeks - schedule follow up in 3 months and we will plan to check labs then  Nasal saline twice daily  nasonex and zyrtec daily   Take the antibiotic if worsening or not improving  I hope you are feeling better soon! Seek care immediately if worsening, new concerns or you are not improving with treatment.   WE NOW OFFER   [facility] FAST TRACK!!!  SAME DAY Appointments for ACUTE CARE  Such as: Sprains, Injuries, cuts, abrasions, rashes, muscle pain, joint pain, back pain Colds, flu, sore throats, headache, allergies, cough, fever  Ear pain, sinus and eye infections Abdominal pain, nausea, vomiting, diarrhea, upset stomach Animal/insect bites  3 Easy Ways to Schedule: Walk-In Scheduling Call in scheduling Mychart Sign-up: [email or web address]     We recommend the following healthy lifestyle for LIFE: 1) Small portions.   Tip: eat off of a salad plate instead of a dinner plate.  Tip: It is ok to feel hungry after a meal - that likely means you ate an appropriate portion.  Tip: if you need more or a snack choose fruits, veggies and/or a handful of nuts or seeds.  2) Eat a healthy clean diet.  * Tip: Avoid (less then 1 serving per week): processed foods, sweets, sweetened drinks, white starches (rice, flour, bread, potatoes, pasta, etc), red meat, fast foods, butter  *Tip: CHOOSE instead   * 5-9 servings per day of fresh or frozen fruits and vegetables (but not corn, potatoes, bananas, canned or dried fruit)   *nuts and seeds, beans   *olives and olive oil   *small portions of lean meats such as fish and white chicken    *small portions of whole grains  3)Get at least 150 minutes of sweaty aerobic exercise per week.  4)Reduce stress - consider counseling, meditation and relaxation to balance other aspects of your life.

## 2016-11-28 NOTE — Telephone Encounter (Signed)
I called the pt and scheduled an appt for today at 2:45pm.

## 2016-11-29 ENCOUNTER — Encounter: Payer: Self-pay | Admitting: Gastroenterology

## 2016-12-07 ENCOUNTER — Ambulatory Visit: Payer: Self-pay | Admitting: Family Medicine

## 2016-12-20 ENCOUNTER — Encounter: Payer: Self-pay | Admitting: Gastroenterology

## 2016-12-20 ENCOUNTER — Other Ambulatory Visit: Payer: Self-pay | Admitting: Gastroenterology

## 2017-01-02 NOTE — Progress Notes (Signed)
HPI:  Muhamad Serano is a pleasant 61 y.o. here for follow up. Chronic medical problems summarized below were reviewed for changes and stability and were updated as needed below. These issues and their treatment remain stable for the most part. Reports he is doing well. Colonoscopy scheduled for June - says this was the earliest GI had. "walking the dog" and trying to eat healthy. Mood good. Denies CP, SOB, DOE, treatment intolerance or new symptoms.  Anxiety and Depression: -chronic, mild intermittent, worsened in 2016 -wellbutrin worsening symptoms remotely -better with CBT (DR. 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 SFuller Plan -he did finally get his colonoscopy 01/2015- with multiple abnormalities and per GI notes due for repeat colonoscopy in 2018 -Denies: abd pain, fevers, blood in stools    ROS: See pertinent positives and negatives per HPI.  Past Medical History:  Diagnosis Date  . ALLERGIC RHINITIS CAUSE UNSPECIFIED 05/21/2009  . BACK PAIN, LUMBAR, WITH RADICULOPATHY 03/18/2010  . COLITIS, ULCERATIVE 05/28/2007  . Glaucoma   . HYPERLIPIDEMIA 05/28/2007  . HYPERTENSION 05/28/2007  . HYPOGONADISM, MALE 07/24/2008  . Internal hemorrhoids   . 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 Recurrent major depressive disorder, in full remission ([place] 10/07/2015   with GAD   . SLEEP APNEA 12/18/2008   wears CPAP  . Tubular adenoma of colon     Past Surgical History:  Procedure Laterality Date  . COLONOSCOPY    . NASAL SINUS SURGERY    . POLYPECTOMY      Family History  Problem  Relation Age of Onset  . Colon cancer Neg Hx   . Esophageal cancer Neg Hx   . Stomach cancer Neg Hx   . Rectal cancer Neg Hx   . Hyperlipidemia Father   . Hypertension Father   . Heart disease Father   . Breast cancer Mother     <50  . Ulcerative colitis Cousin     2 cousins paternal side    Social History   Social History  . Marital status: Single    Spouse name: N/A  . Number of children: 0  . Years of education: N/A   Occupational History  . Realtor   .  [employer]  Social History Main Topics  . Smoking status: Former Smoker    Types: Cigarettes    Quit date: 10/20/2009  . Smokeless tobacco: Never Used  . Alcohol use 4.2 oz/week    7 Standard drinks or equivalent per week     Comment: 1 drink per day at most  . Drug use: No  . Sexual activity: Not Asked   Other Topics Concern  . 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  amLODipine-benazepril (LOTREL) 10-40 MG capsule, TAKE 1 CAPSULE BY MOUTH DAILY., Disp: 30 capsule, Rfl: 5 .  escitalopram (LEXAPRO) 10 MG tablet, TAKE  1 TABLET BY MOUTH DAILY., Disp: 30 tablet, Rfl: 4 .  finasteride (PROPECIA) 1 MG tablet, TAKE 1 TABLET (1 MG TOTAL) BY MOUTH DAILY., Disp: 90 tablet, Rfl: 3 .  Multiple Vitamins-Iron (MULTIVITAMIN/IRON) TABS, Take 1 tablet by mouth daily.  , Disp: , Rfl:  .  NASONEX 50 MCG/ACT nasal spray, PLACE 2 SQUIRTS IN EACH NOSTRIL DAILY (Patient taking differently: PLACE 2 SQUIRTS IN EACH NOSTRIL AS NEEDED), Disp: 17 g, Rfl: 11 .  rosuvastatin (CRESTOR) 20 MG tablet, TAKE 1 TABLET (20 MG TOTAL) BY MOUTH DAILY., Disp: 90 tablet, Rfl: 2 .  sulfaSALAzine (AZULFIDINE) 500 MG EC tablet, TAKE 3 TABLETS BY MOUTH 2 TIMES DAILY., Disp: 180 tablet, Rfl: 0 .  triamcinolone (KENALOG) 0.025 % cream, Apply 1 application  topically 2 (two) times daily., Disp: 30 g, Rfl: 0  EXAM:  Vitals:   01/04/17 0901  BP: 120/80  Pulse: 83  Temp: 98.5 F (36.9 C)    Body mass index is 38.51 kg/m.  GENERAL: vitals reviewed and listed above, alert, oriented, appears well hydrated and in no acute distress  HEENT: atraumatic, conjunttiva clear, no obvious abnormalities on inspection of external nose and ears  NECK: no obvious masses on inspection  LUNGS: clear to auscultation bilaterally, no wheezes, rales or rhonchi, good air movement  CV: HRRR, no peripheral edema  MS: moves all extremities without noticeable abnormality  PSYCH: pleasant and cooperative, no obvious depression or anxiety  ASSESSMENT AND PLAN:  Discussed the following assessment and plan:  Essential hypertension - Plan: Basic metabolic panel, CBC  Hyperlipidemia, unspecified hyperlipidemia type - Plan: Lipid panel  Recurrent major depressive disorder, in full remission ([place])  BMI 38.0-38.9,adult  Hair loss  Ulcerative colitis without complications, unspecified location ([facility])  -fasting labs -lifestyle recs -see pt instructions -Patient advised to return or notify a doctor immediately if symptoms worsen or persist or new concerns arise.  Patient Instructions  BEFORE YOU LEAVE: -phq9 if not done -labs -follow up: 3-4 months  Get colonoscopy as planned.  Continue current medications.  We have ordered labs or studies at this visit. It can take up to 1-2 weeks for results and processing. IF results require follow up or explanation, we will call you with instructions. Clinically stable results will be released to your [facility]. If you have not heard from [place] or cannot find your results in [facility] in 2 weeks please contact our office at [phone].  If you are not yet signed up for [facility], please consider signing up.  Advise regular aerobic exercise (at least 150 minutes per week of sweaty exercise) and a healthy diet. Try to eat  at least 5-9 servings of vegetables and fruits per day (not corn, potatoes or bananas.) Avoid sweets, red meat, pork, butter, fried foods, fast food, processed food, excessive dairy, eggs and coconut. Replace bad fats with good fats - fish, nuts and seeds, canola oil, olive oil.   WE NOW OFFER   [facility] FAST TRACK!!!  SAME DAY Appointments for ACUTE CARE  Such as: Sprains, Injuries, cuts, abrasions, rashes, muscle pain, joint pain, back pain Colds, flu, sore throats, headache, allergies, cough, fever  Ear pain, sinus and eye infections Abdominal pain, nausea, vomiting, diarrhea, upset stomach Animal/insect bites  3 Easy Ways to Schedule: Walk-In Scheduling Call in scheduling Mychart Sign-up: [email or web address]                 Colin Benton R., DO

## 2017-01-04 ENCOUNTER — Encounter: Payer: Self-pay | Admitting: Family Medicine

## 2017-01-04 ENCOUNTER — Other Ambulatory Visit: Payer: Self-pay | Admitting: Family Medicine

## 2017-01-04 ENCOUNTER — Ambulatory Visit (INDEPENDENT_AMBULATORY_CARE_PROVIDER_SITE_OTHER): Payer: BLUE CROSS/BLUE SHIELD | Admitting: Family Medicine

## 2017-01-04 VITALS — BP 120/80 | HR 83 | Temp 98.5°F | Ht 73.25 in | Wt 293.9 lb

## 2017-01-04 DIAGNOSIS — Z6838 Body mass index (BMI) 38.0-38.9, adult: Secondary | ICD-10-CM

## 2017-01-04 DIAGNOSIS — F3342 Major depressive disorder, recurrent, in full remission: Secondary | ICD-10-CM | POA: Diagnosis not present

## 2017-01-04 DIAGNOSIS — L659 Nonscarring hair loss, unspecified: Secondary | ICD-10-CM

## 2017-01-04 DIAGNOSIS — E785 Hyperlipidemia, unspecified: Secondary | ICD-10-CM | POA: Diagnosis not present

## 2017-01-04 DIAGNOSIS — I10 Essential (primary) hypertension: Secondary | ICD-10-CM

## 2017-01-04 DIAGNOSIS — K5190 Ulcerative colitis, unspecified, without complications: Secondary | ICD-10-CM

## 2017-01-04 LAB — LIPID PANEL
Cholesterol: 204 mg/dL — ABNORMAL HIGH (ref 0–200)
HDL: 54.4 mg/dL (ref >39.00)
LDL CALC: 120 mg/dL — AB (ref 0–99)
NonHDL: 149.13
Total CHOL/HDL Ratio: 4
Triglycerides: 147 mg/dL (ref 0.0–149.0)
VLDL: 29.4 mg/dL (ref 0.0–40.0)

## 2017-01-04 LAB — CBC
HEMATOCRIT: 44.5 % (ref 39.0–52.0)
Hemoglobin: 15 g/dL (ref 13.0–17.0)
MCHC: 33.7 g/dL (ref 30.0–36.0)
MCV: 93 fl (ref 78.0–100.0)
Platelets: 234 10*3/uL (ref 150.0–400.0)
RBC: 4.79 Mil/uL (ref 4.22–5.81)
RDW: 14 % (ref 11.5–15.5)
WBC: 6.7 10*3/uL (ref 4.0–10.5)

## 2017-01-04 LAB — BASIC METABOLIC PANEL
BUN: 15 mg/dL (ref 6–23)
CHLORIDE: 103 meq/L (ref 96–112)
CO2: 29 meq/L (ref 19–32)
Calcium: 9.4 mg/dL (ref 8.4–10.5)
Creatinine, Ser: 0.74 mg/dL (ref 0.40–1.50)
GFR: 115.06 mL/min (ref >60.00)
Glucose, Bld: 102 mg/dL — ABNORMAL HIGH (ref 70–99)
Potassium: 4.4 mEq/L (ref 3.5–5.1)
Sodium: 138 mEq/L (ref 135–145)

## 2017-01-04 NOTE — Patient Instructions (Signed)
BEFORE YOU LEAVE: -phq9 if not done -labs -follow up: 3-4 months  Get colonoscopy as planned.  Continue current medications.  We have ordered labs or studies at this visit. It can take up to 1-2 weeks for results and processing. IF results require follow up or explanation, we will call you with instructions. Clinically stable results will be released to your [facility]. If you have not heard from [place] or cannot find your results in [facility] in 2 weeks please contact our office at [phone].  If you are not yet signed up for [facility], please consider signing up.  Advise regular aerobic exercise (at least 150 minutes per week of sweaty exercise) and a healthy diet. Try to eat at least 5-9 servings of vegetables and fruits per day (not corn, potatoes or bananas.) Avoid sweets, red meat, pork, butter, fried foods, fast food, processed food, excessive dairy, eggs and coconut. Replace bad fats with good fats - fish, nuts and seeds, canola oil, olive oil.   WE NOW OFFER   [facility] FAST TRACK!!!  SAME DAY Appointments for ACUTE CARE  Such as: Sprains, Injuries, cuts, abrasions, rashes, muscle pain, joint pain, back pain Colds, flu, sore throats, headache, allergies, cough, fever  Ear pain, sinus and eye infections Abdominal pain, nausea, vomiting, diarrhea, upset stomach Animal/insect bites  3 Easy Ways to Schedule: Walk-In Scheduling Call in scheduling Mychart Sign-up: [email or web address]

## 2017-01-04 NOTE — Progress Notes (Signed)
Pre visit review using our clinic review tool, if applicable. No additional management support is needed unless otherwise documented below in the visit note. 

## 2017-01-15 ENCOUNTER — Other Ambulatory Visit: Payer: Self-pay

## 2017-01-15 MED ORDER — SULFASALAZINE 500 MG PO TBEC: DELAYED_RELEASE_TABLET | ORAL | 0 refills | Status: DC

## 2017-01-16 ENCOUNTER — Other Ambulatory Visit: Payer: Self-pay

## 2017-01-16 MED ORDER — SULFASALAZINE 500 MG PO TBEC: DELAYED_RELEASE_TABLET | ORAL | 0 refills | Status: DC

## 2017-02-02 HISTORY — PX: COLONOSCOPY: SHX174

## 2017-02-11 ENCOUNTER — Other Ambulatory Visit: Payer: Self-pay | Admitting: Family Medicine

## 2017-02-14 ENCOUNTER — Ambulatory Visit (AMBULATORY_SURGERY_CENTER): Payer: Self-pay | Admitting: *Deleted

## 2017-02-14 ENCOUNTER — Other Ambulatory Visit: Payer: Self-pay

## 2017-02-14 VITALS — Ht 73.0 in | Wt 299.0 lb

## 2017-02-14 DIAGNOSIS — Z86010 Personal history of colonic polyps: Secondary | ICD-10-CM

## 2017-02-14 MED ORDER — SULFASALAZINE 500 MG PO TBEC: DELAYED_RELEASE_TABLET | ORAL | 0 refills | Status: DC

## 2017-02-14 MED ORDER — NA SULFATE-K SULFATE-MG SULF 17.5-3.13-1.6 GM/177ML PO SOLN: 1.0000 | Freq: Once | ORAL | 0 refills | Status: AC

## 2017-02-14 NOTE — Progress Notes (Signed)
No egg or soy allergy known to patient  No issues with past sedation with any surgeries  or procedures, no intubation problems  No diet pills per patient No home 02 use per patient  No blood thinners per patient  Pt denies issues with constipation  No A fib or A flutter  EMMI video sent to pt's e mail  Pay no more than 50$ coupon to pt for suprep Pt asked about refilling sulfasalazine - called sheri and she gave a refill and scheduled an OV 03-23-17 with Dr Fuller Plan for Follow Up UC - pt given AVS with this OV date and time

## 2017-02-15 ENCOUNTER — Encounter: Payer: Self-pay | Admitting: Gastroenterology

## 2017-02-15 ENCOUNTER — Other Ambulatory Visit: Payer: Self-pay | Admitting: Family Medicine

## 2017-03-01 ENCOUNTER — Encounter: Payer: Self-pay | Admitting: Gastroenterology

## 2017-03-01 ENCOUNTER — Ambulatory Visit (AMBULATORY_SURGERY_CENTER): Payer: BLUE CROSS/BLUE SHIELD | Admitting: Gastroenterology

## 2017-03-01 VITALS — BP 129/85 | HR 66 | Temp 98.7°F | Resp 24 | Ht 73.0 in | Wt 299.0 lb

## 2017-03-01 DIAGNOSIS — K6389 Other specified diseases of intestine: Secondary | ICD-10-CM

## 2017-03-01 DIAGNOSIS — K5130 Ulcerative (chronic) rectosigmoiditis without complications: Secondary | ICD-10-CM

## 2017-03-01 DIAGNOSIS — D123 Benign neoplasm of transverse colon: Secondary | ICD-10-CM

## 2017-03-01 DIAGNOSIS — D124 Benign neoplasm of descending colon: Secondary | ICD-10-CM

## 2017-03-01 DIAGNOSIS — Z86010 Personal history of colonic polyps: Secondary | ICD-10-CM | POA: Diagnosis present

## 2017-03-01 DIAGNOSIS — D125 Benign neoplasm of sigmoid colon: Secondary | ICD-10-CM

## 2017-03-01 DIAGNOSIS — K635 Polyp of colon: Secondary | ICD-10-CM

## 2017-03-01 DIAGNOSIS — K5150 Left sided colitis without complications: Secondary | ICD-10-CM | POA: Diagnosis not present

## 2017-03-01 MED ORDER — SODIUM CHLORIDE 0.9 % IV SOLN: 500.0000 mL | INTRAVENOUS | Status: DC

## 2017-03-01 NOTE — Op Note (Signed)
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 The patient's tolerance of                            previous anesthesia was also reviewed. The risks                            and benefits of the procedure and the sedation                            options and risks were discussed with the patient.                            All questions were answered, and informed consent                            was obtained. Prior Anticoagulants: The patient has                            taken no previous anticoagulant or antiplatelet                            agents. ASA Grade Assessment: II - A patient with                            mild systemic disease. After reviewing the risks                            and benefits, the patient was deemed in                            satisfactory condition to undergo the procedure.  After obtaining informed consent, the colonoscope                            was passed under direct vision. Throughout the                            procedure, the patient's blood pressure, pulse, and                            oxygen saturations were monitored continuously. The                            Model [id number]-[id number] scope was introduced                            through the  anus and advanced to the the cecum,                            identified by appendiceal orifice and ileocecal                            valve. The ileocecal valve, appendiceal orifice,                            and rectum were photographed. The quality of the                            bowel preparation was good. The colonoscopy was                            performed without difficulty. The patient tolerated                            the procedure well. Scope In: 8:15:06 AM Scope Out: 8:37:49 AM Scope Withdrawal Time: 0 hours 19 minutes 15 seconds  Total Procedure Duration: 0 hours 22 minutes 43 seconds  Findings:                 The perianal and digital rectal examinations were                            normal.                           A 12 mm polyp was found in the transverse colon.                            The polyp was sessile. The polyp was removed with a                            hot snare. Resection and retrieval were complete.                           A 18 mm polyp was found in the descending colon.  The polyp was sessile. The polyp was removed with a                            piecemeal technique using a hot snare. Resection                            and retrieval were complete.                           A 10 mm polyp was found in the sigmoid colon. The                            polyp was semi-pedunculated. The polyp was removed                            with a hot snare. Resection and retrieval were                            complete.                           A 8 mm polyp was found in the transverse colon. The                            polyp was sessile. The polyp was removed with a                            cold snare. Resection and retrieval were complete.                           Inflammation characterized by erythema, friability                            and granularity was found in a patchy pattern from                             the rectum to the splenic flexure. The sigmoid                            colon was spared. This was mild in severity, and                            when compared to previous examinations, the                            findings are unchanged. Biopsies were taken with a                            cold forceps for histology.                           Internal hemorrhoids were found during  retroflexion. The hemorrhoids were small and Grade                            I (internal hemorrhoids that do not prolapse).                           The exam was otherwise without abnormality on                            direct and retroflexion views. Random right colon                            biopsies obtained. Complications:            No immediate complications. Estimated blood loss:                            None. Estimated Blood Loss:     Estimated blood loss: none. Impression:               - One 12 mm polyp in the transverse colon, removed                            with a hot snare. Resected and retrieved.                           - One 18 mm polyp in the descending colon, removed                            piecemeal using a hot snare. Resected and retrieved.                           - One 10 mm polyp in the sigmoid colon, removed                            with a hot snare. Resected and retrieved.                           - One 8 mm polyp in the transverse colon, removed                            with a cold snare. Resected and retrieved.                           - Left-sided colitis. Inflammation was found from                            the rectum to the splenic flexure. This was mild in                            severity. The findings are unchanged compared to                            previous examinations. Biopsied.                           -  Internal hemorrhoids.                           - The examination was otherwise normal on direct                             and retroflexion views. Recommendation:           - Repeat colonoscopy date to be determined after                            pending pathology results are reviewed for                            surveillance.                           - Patient has a contact number available for                            emergencies. The signs and symptoms of potential                            delayed complications were discussed with the                            patient. Return to normal activities tomorrow.                            Written discharge instructions were provided to the                            patient.                           - Resume previous diet.                           - Continue present medications.                           - Await pathology results.                           - No aspirin, ibuprofen, naproxen, or other                            non-steroidal anti-inflammatory drugs for 2 weeks                            after polyp removal. Ladene Artist, MD 03/01/2017 8:46:04 AM This report has been signed electronically.

## 2017-03-01 NOTE — Progress Notes (Signed)
Report to PACU, RN, vss, BBS= Clear.  

## 2017-03-01 NOTE — Patient Instructions (Signed)
YOU HAD AN ENDOSCOPIC PROCEDURE TODAY AT [facility]:   Refer to the procedure report that was given to you for any specific questions about what was found during the examination.  If the procedure report does not answer your questions, please call your gastroenterologist to clarify.  If you requested that your care partner not be given the details of your procedure findings, then the procedure report has been included in a sealed envelope for you to review at your convenience later.  YOU SHOULD EXPECT: Some feelings of bloating in the abdomen. Passage of more gas than usual.  Walking can help get rid of the air that was put into your GI tract during the procedure and reduce the bloating. If you had a lower endoscopy (such as a colonoscopy or flexible sigmoidoscopy) you may notice spotting of blood in your stool or on the toilet paper. If you underwent a bowel prep for your procedure, you may not have a normal bowel movement for a few days.  Please Note:  You might notice some irritation and congestion in your nose or some drainage.  This is from the oxygen used during your procedure.  There is no need for concern and it should clear up in a day or so.  SYMPTOMS TO REPORT IMMEDIATELY:   Following lower endoscopy (colonoscopy or flexible sigmoidoscopy):  Excessive amounts of blood in the stool  Significant tenderness or worsening of abdominal pains  Swelling of the abdomen that is new, acute  Fever of 100F or higher   For urgent or emergent issues, a gastroenterologist can be reached at any hour by calling [phone].  Please read all handouts given to you by your recovery nurse. No NSAIDS, IBuprofen, Aspirin products for 2 weeks.  DIET:  We do recommend a small meal at first, but then you may proceed to your regular diet.  Drink plenty of fluids but you should avoid alcoholic beverages for 24 hours.  ACTIVITY:  You should plan to take it easy for the rest of today and you  should NOT DRIVE or use heavy machinery until tomorrow (because of the sedation medicines used during the test).    FOLLOW UP: Our staff will call the number listed on your records the next business day following your procedure to check on you and address any questions or concerns that you may have regarding the information given to you following your procedure. If we do not reach you, we will leave a message.  However, if you are feeling well and you are not experiencing any problems, there is no need to return our call.  We will assume that you have returned to your regular daily activities without incident.  If any biopsies were taken you will be contacted by phone or by letter within the next 1-3 weeks.  Please call us at [phone] if you have not heard about the biopsies in 3 weeks.    SIGNATURES/CONFIDENTIALITY: You and/or your care partner have signed paperwork which will be entered into your electronic medical record.  These signatures attest to the fact that that the information above on your After Visit Summary has been reviewed and is understood.  Full responsibility of the confidentiality of this discharge information lies with you and/or your care-partner.  Thank you for letting us take care of your healthcare needs today.

## 2017-03-01 NOTE — Progress Notes (Signed)
Called to room to assist during endoscopic procedure.  Patient ID and intended procedure confirmed with present staff. Received instructions for my participation in the procedure from the performing physician.  

## 2017-03-02 ENCOUNTER — Telehealth: Payer: Self-pay

## 2017-03-02 NOTE — Telephone Encounter (Signed)
  Follow up Call-  Call back number 03/01/2017 01/19/2015  Post procedure Call Back phone  # [phone]  Permission to leave phone message Yes Yes  Some recent data might be hidden     Left message

## 2017-03-02 NOTE — Telephone Encounter (Signed)
  Follow up Call-  Call back number 03/01/2017 01/19/2015  Post procedure Call Back phone  # [phone]  Permission to leave phone message Yes Yes  Some recent data might be hidden     Patient questions:  Do you have a fever, pain , or abdominal swelling? No. Pain Score  0 *  Have you tolerated food without any problems? Yes.    Have you been able to return to your normal activities? Yes.    Do you have any questions about your discharge instructions: Diet   No. Medications  No. Follow up visit  No.  Do you have questions or concerns about your Care? No.  Actions: * If pain score is 4 or above: No action needed, pain <4.

## 2017-03-11 ENCOUNTER — Encounter: Payer: Self-pay | Admitting: Gastroenterology

## 2017-03-15 ENCOUNTER — Encounter: Payer: Self-pay | Admitting: Gastroenterology

## 2017-03-23 ENCOUNTER — Encounter: Payer: Self-pay | Admitting: Gastroenterology

## 2017-03-23 ENCOUNTER — Ambulatory Visit (INDEPENDENT_AMBULATORY_CARE_PROVIDER_SITE_OTHER): Payer: BLUE CROSS/BLUE SHIELD | Admitting: Gastroenterology

## 2017-03-23 VITALS — BP 110/80 | HR 72 | Ht 72.0 in | Wt 301.2 lb

## 2017-03-23 DIAGNOSIS — K529 Noninfective gastroenteritis and colitis, unspecified: Secondary | ICD-10-CM | POA: Diagnosis not present

## 2017-03-23 DIAGNOSIS — K5100 Ulcerative (chronic) pancolitis without complications: Secondary | ICD-10-CM | POA: Diagnosis not present

## 2017-03-23 DIAGNOSIS — K5140 Inflammatory polyps of colon without complications: Secondary | ICD-10-CM

## 2017-03-23 NOTE — Patient Instructions (Addendum)
Normal BMI (Body Mass Index- based on height and weight) is between 19 and 25. Your BMI today is Body mass index is 40.86 kg/m. Richard [name] Please consider follow up  regarding your BMI with your Primary Care Provider.  Please follow up in 6 months.  Thank you for choosing me and [facility].  [name]. [name], MD., [name]

## 2017-03-23 NOTE — Progress Notes (Signed)
    History of Present Illness: This is a 61 year old male returning for ulcerative colitis. He is asymptomatic. Recent colonoscopy below. He states he often misses his evening dose of sulfasalazine however he always takes his morning dose.  Colonoscopy 02/2017: - One 12 mm polyp in the transverse colon, removed with a hot snare. Resected and retrieved. - One 18 mm polyp in the descending colon, removed piecemeal using a hot snare. Resected and retrieved. - One 10 mm polyp in the sigmoid. The polyp was semi-pedunculated. The polyp was removed with a hot snare. Resection and retrieval were complete. - A 8 mm polyp was found in the transverse colon. The polyp was sessile. The polyp was removed with a cold snare. Resection and retrieval were complete. - Inflammation characterized by erythema, friability and granularity was found in a patchy pattern from the rectum to the splenic flexure. The sigmoid colon was spared. This was mild in severity, and when compared to previous examinations, the findings are unchanged. Biopsies were taken with a cold forceps for histology. - Internal hemorrhoids were found during retroflexion. The hemorrhoids were small and Grade I (internal hemorrhoids that do not prolapse). [facility] - The exam was otherwise without abnormality on direct and retroflexion views. Random right colon biopsies obtained.  1. Surgical [P], random sites right colon BX - UNREMARKABLE COLONIC MUCOSA. - NEGATIVE FOR DYSPLASIA. - NO ACTIVE INFLAMMATION OR GRANULOMAS. 2. Surgical [P], random sites left colon BX - MILD ACTIVE COLITIS WITH FOCAL EROSION. - SEE MICROSCOPIC DESCRIPTION 3. Surgical [P], transverse, polyp (2) - BENIGN POLYPOID COLONIC MUCOSA. - NO ADENOMATOUS CHANGE OR MALIGNANCY. 4. Surgical [P], descending, polyp - HYPERPLASTIC POLYP AND BENIGN COLONIC MUCOSA. - NO ADENOMATOUS CHANGE OR MALIGNANCY. 5. Surgical [P], sigmoid, polyp - INFLAMMATORY POLYP. - NO  ADENOMATOUS CHANGE OR MALIGNANCY  Current Medications, Allergies, Past Medical History, Past Surgical History, Family History and Social History were reviewed in [employer] record.  Physical Exam: General: Well developed, well nourished, no acute distress Head: Normocephalic and atraumatic Eyes:  sclerae anicteric, EOMI Ears: Normal auditory acuity Neurological: Alert oriented x 4, grossly nonfocal Psychological:  Alert and cooperative. Normal mood and affect  Assessment and Recommendations:  1. Ulcerative colitis. Endoscopic and microscopic activity on recent colonoscopy. Polyps were inflammatory and hyperplastic. Pathology report reviewed in detail. Questions answered. Long term mgmt reviewed. Compliance with sulfasalazine 1.5 g twice a day was discussed. A 2 year interval surveillance colonoscopy is recommended in June 2020. REV in 6 months.   I spent 15 minutes of face-to-face time with the patient. Greater than 50% of the time was spent counseling and coordinating care.  [name]. Fuller Plan, MD [facility]

## 2017-04-20 ENCOUNTER — Other Ambulatory Visit: Payer: Self-pay | Admitting: Family Medicine

## 2017-05-24 ENCOUNTER — Encounter: Payer: Self-pay | Admitting: Family Medicine

## 2017-05-31 ENCOUNTER — Ambulatory Visit: Payer: Self-pay | Admitting: Family Medicine

## 2017-06-20 NOTE — Progress Notes (Signed)
HPI:  Richard [name] is a pleasant 61 y.o. here for follow up. Chronic medical problems summarized below were reviewed for changes. Reports is doing well. Reports his mood has been stable, has some minor and she is asleep, but overall is doing quite well. Does wish to continue the Lexapro for now. He is not getting any regular exercise, but does wish to start. Trying to eat healthier. He has gained some weight from vacation recently. Denies CP, SOB, DOE, treatment intolerance or new symptoms.  Due for flu shot, bmp, cbc, phq9  Anxiety and Depression: -chronic, mild intermittent, worsened in 2016 -wellbutrin worsening symptoms remotely -better with CBT (DR. 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 SFuller Plan -he did finally get his colonoscopy 01/2015- with multiple abnormalities and per GI notes due for repeat colonoscopy in 2018 -Denies: abd pain, fevers, blood in stools    ROS: See pertinent positives and negatives per HPI.  Past Medical History:  Diagnosis Date  . ALLERGIC RHINITIS CAUSE UNSPECIFIED 05/21/2009  . Allergy   . BACK PAIN, LUMBAR, WITH RADICULOPATHY 03/18/2010  . COLITIS, ULCERATIVE 05/28/2007  . Glaucoma    2 yrs ago eye md told does NOT have glaucoma   . HYPERLIPIDEMIA 05/28/2007  . HYPERTENSION 05/28/2007  . HYPOGONADISM, MALE 07/24/2008  . Internal hemorrhoids   . 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 Recurrent major depressive disorder, in full remission ([place] 10/07/2015   with GAD   . SLEEP APNEA 12/18/2008   wears CPAP  . Sleep apnea    wears cpap   .  Tubular adenoma of colon     Past Surgical History:  Procedure Laterality Date  . COLONOSCOPY    . NASAL SINUS SURGERY    . POLYPECTOMY      Family History  Problem Relation Age of Onset  . Hyperlipidemia Father   . Hypertension Father   . Heart disease Father   . Breast cancer Mother        <50  . Ulcerative colitis Cousin        2 cousins paternal side  . Colon polyps Cousin   . Colon cancer Neg Hx   . Esophageal cancer Neg Hx   . Stomach cancer Neg Hx   . Rectal cancer Neg Hx     Social History   Social History  . Marital status: Single    Spouse name: N/A  . Number of children: 0  . Years of education: N/A   Occupational History  . Realtor   .  [employer]  Social History Main Topics  . Smoking status: Former Smoker    Types: Cigarettes    Quit date: 10/21/1999  . Smokeless tobacco: Never Used  . Alcohol use 4.2 oz/week    7 Standard drinks or equivalent per week     Comment: 1 drink per day at most  . Drug use: No  . Sexual activity: Not Asked   Other Topics Concern  . 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  amLODipine-benazepril (LOTREL) 10-40 MG capsule, TAKE 1 CAPSULE BY MOUTH DAILY., Disp: 30 capsule, Rfl: 5 .  escitalopram (LEXAPRO) 10 MG tablet, TAKE 1 TABLET BY MOUTH DAILY., Disp: 30 tablet, Rfl: 5 .  finasteride (PROPECIA) 1 MG tablet, TAKE 1 TABLET (1 MG TOTAL) BY MOUTH DAILY., Disp: 90 tablet, Rfl: 3 .  Multiple Vitamins-Iron (MULTIVITAMIN/IRON) TABS, Take 1 tablet by mouth daily.  , Disp: , Rfl:  .  NASONEX 50 MCG/ACT nasal spray, PLACE 2 SQUIRTS IN EACH NOSTRIL DAILY (Patient taking differently: PLACE 2 SQUIRTS IN EACH NOSTRIL AS NEEDED), Disp: 17 g, Rfl: 11 .  rosuvastatin (CRESTOR) 20 MG tablet, TAKE 1 TABLET (20 MG TOTAL) BY MOUTH DAILY.,  Disp: 90 tablet, Rfl: 1 .  sulfaSALAzine (AZULFIDINE) 500 MG EC tablet, TAKE 3 TABLETS BY MOUTH 2 TIMES DAILY., Disp: 540 tablet, Rfl: 0 .  triamcinolone (KENALOG) 0.025 % cream, Apply 1 application topically 2 (two) times daily., Disp: 30 g, Rfl: 0  EXAM:  Vitals:   06/21/17 1014  BP: 120/62  Pulse: 88  Temp: 99.1 F (37.3 C)    Body mass index is 41.45 kg/m.  GENERAL: vitals reviewed and listed above, alert, oriented, appears well hydrated and in no acute distress  HEENT: atraumatic, conjunttiva clear, no obvious abnormalities on inspection of external nose and ears  NECK: no obvious masses on inspection  LUNGS: clear to auscultation bilaterally, no wheezes, rales or rhonchi, good air movement  CV: HRRR, no peripheral edema  MS: moves all extremities without noticeable abnormality  PSYCH: pleasant and cooperative, no obvious depression or anxiety  ASSESSMENT AND PLAN:  Discussed the following assessment and plan:  Anxiety and depression Recurrent major depressive disorder, in full remission ([place]) -repeat Phq9, at least half of 15 minutes spent counseling patient -we will continue the Lexapro for now as he is doing well  Hyperlipidemia, unspecified hyperlipidemia type Essential hypertension - Plan: Basic metabolic panel, CBC Morbid obesity -Lifestyle recommendations -Encouraged that the time is now to commit to lifelong healthy habits including a healthy diet and regular exercise -Continue current treatments -Labs per orders  Ulcerative pancolitis without complication [facility]) -sees specialists for management  -Patient advised to return or notify a doctor immediately if symptoms worsen or persist or new concerns arise.  Patient Instructions  BEFORE YOU LEAVE: -flu shot -labs -follow up: 3-4 months  We have ordered labs or studies at this visit. It can take up to 1-2 weeks for results and processing. IF results require follow up or explanation, we will call you  with instructions. Clinically stable results will be released to your [facility]. If you have not heard from [place] or cannot find your results in [facility] in 2 weeks please contact our office at [phone].  If you are not yet signed up for [facility], please consider signing up.  Advise regular aerobic exercise (at least 150 minutes per week of sweaty exercise) and a healthy diet. Try to eat at least 5-9 servings of vegetables and fruits per day (not corn, potatoes or bananas.) Avoid sweets, red meat, pork, butter, fried foods, fast food, processed food, excessive dairy, eggs and coconut. Replace bad fats with good fats - fish, nuts and seeds, canola oil, olive oil.   WE NOW OFFER   [facility] FAST TRACK!!!  SAME DAY Appointments for ACUTE  CARE  Such as: Sprains, Injuries, cuts, abrasions, rashes, muscle pain, joint pain, back pain Colds, flu, sore throats, headache, allergies, cough, fever  Ear pain, sinus and eye infections Abdominal pain, nausea, vomiting, diarrhea, upset stomach Animal/insect bites  3 Easy Ways to Schedule: Walk-In Scheduling Call in scheduling Mychart Sign-up: [email or web address]             Colin Benton R., DO

## 2017-06-21 ENCOUNTER — Ambulatory Visit (INDEPENDENT_AMBULATORY_CARE_PROVIDER_SITE_OTHER): Payer: BLUE CROSS/BLUE SHIELD | Admitting: Family Medicine

## 2017-06-21 ENCOUNTER — Encounter: Payer: Self-pay | Admitting: Family Medicine

## 2017-06-21 VITALS — BP 120/62 | HR 88 | Temp 99.1°F | Ht 72.0 in | Wt 305.6 lb

## 2017-06-21 DIAGNOSIS — I10 Essential (primary) hypertension: Secondary | ICD-10-CM | POA: Diagnosis not present

## 2017-06-21 DIAGNOSIS — Z23 Encounter for immunization: Secondary | ICD-10-CM

## 2017-06-21 DIAGNOSIS — F3342 Major depressive disorder, recurrent, in full remission: Secondary | ICD-10-CM

## 2017-06-21 DIAGNOSIS — F329 Major depressive disorder, single episode, unspecified: Secondary | ICD-10-CM

## 2017-06-21 DIAGNOSIS — E785 Hyperlipidemia, unspecified: Secondary | ICD-10-CM | POA: Diagnosis not present

## 2017-06-21 DIAGNOSIS — F419 Anxiety disorder, unspecified: Secondary | ICD-10-CM

## 2017-06-21 DIAGNOSIS — K5100 Ulcerative (chronic) pancolitis without complications: Secondary | ICD-10-CM | POA: Diagnosis not present

## 2017-06-21 LAB — BASIC METABOLIC PANEL
BUN: 16 mg/dL (ref 6–23)
CALCIUM: 9.4 mg/dL (ref 8.4–10.5)
CO2: 29 meq/L (ref 19–32)
CREATININE: 0.77 mg/dL (ref 0.40–1.50)
Chloride: 102 mEq/L (ref 96–112)
GFR: 109.73 mL/min (ref >60.00)
GLUCOSE: 131 mg/dL — AB (ref 70–99)
Potassium: 4 mEq/L (ref 3.5–5.1)
SODIUM: 138 meq/L (ref 135–145)

## 2017-06-21 LAB — CBC
HCT: 42.4 % (ref 39.0–52.0)
HEMOGLOBIN: 14.1 g/dL (ref 13.0–17.0)
MCHC: 33.3 g/dL (ref 30.0–36.0)
MCV: 92.4 fl (ref 78.0–100.0)
Platelets: 216 10*3/uL (ref 150.0–400.0)
RBC: 4.59 Mil/uL (ref 4.22–5.81)
RDW: 13.9 % (ref 11.5–15.5)
WBC: 6.5 10*3/uL (ref 4.0–10.5)

## 2017-06-21 NOTE — Addendum Note (Signed)
Addended by: Agnes Lawrence on: 06/21/2017 11:42 AM   Modules accepted: Orders

## 2017-06-21 NOTE — Patient Instructions (Signed)
BEFORE YOU LEAVE: -flu shot -labs -follow up: 3-4 months  We have ordered labs or studies at this visit. It can take up to 1-2 weeks for results and processing. IF results require follow up or explanation, we will call you with instructions. Clinically stable results will be released to your [facility]. If you have not heard from [place] or cannot find your results in [facility] in 2 weeks please contact our office at [phone].  If you are not yet signed up for [facility], please consider signing up.  Advise regular aerobic exercise (at least 150 minutes per week of sweaty exercise) and a healthy diet. Try to eat at least 5-9 servings of vegetables and fruits per day (not corn, potatoes or bananas.) Avoid sweets, red meat, pork, butter, fried foods, fast food, processed food, excessive dairy, eggs and coconut. Replace bad fats with good fats - fish, nuts and seeds, canola oil, olive oil.   WE NOW OFFER   [facility] FAST TRACK!!!  SAME DAY Appointments for ACUTE CARE  Such as: Sprains, Injuries, cuts, abrasions, rashes, muscle pain, joint pain, back pain Colds, flu, sore throats, headache, allergies, cough, fever  Ear pain, sinus and eye infections Abdominal pain, nausea, vomiting, diarrhea, upset stomach Animal/insect bites  3 Easy Ways to Schedule: Walk-In Scheduling Call in scheduling Mychart Sign-up: [email or web address]

## 2017-07-24 ENCOUNTER — Other Ambulatory Visit: Payer: Self-pay | Admitting: Family Medicine

## 2017-07-29 ENCOUNTER — Other Ambulatory Visit: Payer: Self-pay | Admitting: Family Medicine

## 2017-08-18 ENCOUNTER — Other Ambulatory Visit: Payer: Self-pay | Admitting: Gastroenterology

## 2017-08-21 ENCOUNTER — Telehealth: Payer: Self-pay | Admitting: Gastroenterology

## 2017-08-21 ENCOUNTER — Other Ambulatory Visit: Payer: Self-pay | Admitting: *Deleted

## 2017-08-21 MED ORDER — SULFASALAZINE 500 MG PO TBEC: DELAYED_RELEASE_TABLET | ORAL | 0 refills | Status: DC

## 2017-08-21 MED ORDER — ESCITALOPRAM OXALATE 10 MG PO TABS: 10.0000 mg | ORAL_TABLET | Freq: Every day | ORAL | 5 refills | Status: DC

## 2017-08-21 NOTE — Telephone Encounter (Signed)
Prescription sent to patient's pharmacy and informed patient to keep appt for further refills

## 2017-09-13 ENCOUNTER — Encounter: Payer: Self-pay | Admitting: Family Medicine

## 2017-10-11 ENCOUNTER — Ambulatory Visit: Payer: Self-pay | Admitting: Gastroenterology

## 2017-10-19 ENCOUNTER — Other Ambulatory Visit: Payer: Self-pay | Admitting: *Deleted

## 2017-10-19 MED ORDER — ROSUVASTATIN CALCIUM 20 MG PO TABS: 20.0000 mg | ORAL_TABLET | Freq: Every day | ORAL | 1 refills | Status: DC

## 2017-10-19 NOTE — Telephone Encounter (Signed)
Rx done. 

## 2017-10-20 NOTE — Progress Notes (Signed)
HPI:  Richard [name] is a pleasant 63 y.o. here for follow up. Chronic medical problems summarized below were reviewed for changes and stability and were updated as needed below. These issues and their treatment remain stable for the most part.  Reports is doing well.  No complaints today.  He thinks he needs a refill on his Propecia.  Not fasting.  Reports mood is good.  PHQ 9 is 2. Denies CP, SOB, DOE, treatment intolerance or new symptoms.  Anxiety and Depression: -chronic, mild intermittent, worsened in 2016 -wellbutrin worsened symptoms remotely -better with CBT (DR. 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 SFuller Plan -he did finally get his colonoscopy 01/2015- with multiple abnormalities and per GI notes due for repeat colonoscopy in 2018 -Denies: abd pain, fevers, blood in stools   ROS: See pertinent positives and negatives per HPI.  Past Medical History:  Diagnosis Date  . ALLERGIC RHINITIS CAUSE UNSPECIFIED 05/21/2009  . Allergy   . BACK PAIN, LUMBAR, WITH RADICULOPATHY 03/18/2010  . COLITIS, ULCERATIVE 05/28/2007  . Glaucoma    2 yrs ago eye md told does NOT have glaucoma   . HYPERLIPIDEMIA 05/28/2007  . HYPERTENSION 05/28/2007  . HYPOGONADISM, MALE 07/24/2008  . Internal hemorrhoids   . 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 Recurrent major depressive disorder, in full [place] 10/07/2015   with GAD   . SLEEP APNEA 12/18/2008   wears CPAP  . Sleep apnea    wears cpap   . Tubular adenoma of colon     Past Surgical History:  Procedure Laterality  Date  . COLONOSCOPY    . NASAL SINUS SURGERY    . POLYPECTOMY      Family History  Problem Relation Age of Onset  . Hyperlipidemia Father   . Hypertension Father   . Heart disease Father   . Breast cancer Mother        <50  . Ulcerative colitis Cousin        2 cousins paternal side  . Colon polyps Cousin   . Colon cancer Neg Hx   . Esophageal cancer Neg Hx   . Stomach cancer Neg Hx   . Rectal cancer Neg Hx     Social History   Socioeconomic History  . Marital status: Single    Spouse name: None  . Number of children: 0  . Years of education: None  . Highest education level: None  Social Needs  . Financial resource strain: None  . Food insecurity - worry: None  . Food insecurity - inability: None  . Transportation needs - medical: None  . Transportation needs - non-medical: None  Occupational History  . Occupation: RArchitectural technologist [employer]  Tobacco Use  . Smoking status: Former Smoker    Types: Cigarettes    Last attempt to quit: 10/21/1999    Years since quitting: 18.0  . Smokeless tobacco: Never Used  Substance and Sexual Activity  . Alcohol use: Yes    Alcohol/week: 4.2 oz    Types: 7 Standard drinks or equivalent per week    Comment: 1 drink per day  at most  . Drug use: No  . Sexual activity: None  Other Topics Concern  . None  Social History Narrative   Work or School: Chartered certified accountant Situation: lives alone      Spiritual Beliefs: none      Lifestyle: walks some; diet is not great        Current Outpatient Medications:  .  amLODipine-benazepril (LOTREL) 10-40 MG capsule, TAKE ONE CAPSULE BY MOUTH EVERY DAY, Disp: 30 capsule, Rfl: 5 .  escitalopram (LEXAPRO) 10 MG tablet, Take 1 tablet (10 mg total) by mouth daily., Disp: 30 tablet, Rfl: 5 .  finasteride (PROPECIA) 1 MG tablet, One tab daily, Disp: 90 tablet, Rfl: 3 .  Multiple Vitamins-Iron (MULTIVITAMIN/IRON) TABS, Take 1 tablet by mouth daily.  , Disp: , Rfl:  .   NASONEX 50 MCG/ACT nasal spray, PLACE 2 SQUIRTS IN EACH NOSTRIL DAILY (Patient taking differently: PLACE 2 SQUIRTS IN EACH NOSTRIL AS NEEDED), Disp: 17 g, Rfl: 11 .  rosuvastatin (CRESTOR) 20 MG tablet, Take 1 tablet (20 mg total) by mouth daily., Disp: 90 tablet, Rfl: 1 .  sulfaSALAzine (AZULFIDINE) 500 MG EC tablet, TAKE 3 TABLETS BY MOUTH 2 TIMES DAILY., Disp: 540 tablet, Rfl: 0 .  triamcinolone (KENALOG) 0.025 % cream, Apply 1 application topically 2 (two) times daily., Disp: 30 g, Rfl: 0  EXAM:  Vitals:   10/22/17 1019  BP: 118/74  Pulse: 84  Temp: 98.5 F (36.9 C)    Body mass index is 42.17 kg/m.  GENERAL: vitals reviewed and listed above, alert, oriented, appears well hydrated and in no acute distress  HEENT: atraumatic, conjunttiva clear, no obvious abnormalities on inspection of external nose and ears  NECK: no obvious masses on inspection  LUNGS: clear to auscultation bilaterally, no wheezes, rales or rhonchi, good air movement  CV: HRRR, no peripheral edema  MS: moves all extremities without noticeable abnormality  PSYCH: pleasant and cooperative, no obvious depression or anxiety  ASSESSMENT AND PLAN:  Discussed the following assessment and plan:  Recurrent major depressive disorder, in full remission ([place])  Morbid obesity ([place])  Essential hypertension - Plan: Basic metabolic panel, CBC  Hyperlipidemia, unspecified hyperlipidemia type - Plan: HDL cholesterol, Cholesterol, total  Hyperglycemia - Plan: Hemoglobin A1c  Ulcerative pancolitis without complication ([place]), Chronic  -Lifestyle recommendations -Labs -Refills per request -Physical in 4-6 months -Patient advised to return or notify a doctor immediately if symptoms worsen or persist or new concerns arise.  Patient Instructions  BEFORE YOU LEAVE: -Labs -follow up: Physical in about 4-6 months  We have ordered labs or studies at this visit. It can take up to 1-2 weeks for results and  processing. IF results require follow up or explanation, we will call you with instructions. Clinically stable results will be released to your [facility]. If you have not heard from [place] or cannot find your results in [facility] in 2 weeks please contact our office at [phone].  If you are not yet signed up for [facility], please consider signing up.   We recommend the following healthy lifestyle for LIFE: 1) Small portions. But, make sure to get regular (at least 3 per day), healthy meals and small healthy snacks if needed.  2) Eat a healthy clean diet.   TRY TO EAT: -at least 5-7 servings of low sugar, colorful, and nutrient rich vegetables per day (not corn, potatoes or bananas.) -berries are the best choice if you wish to eat fruit (only eat  small amounts if trying to reduce weight)  -lean meets (fish, white meat of chicken or [place]) -vegan proteins for some meals - beans or tofu, whole grains, nuts and seeds -Replace bad fats with good fats - good fats include: fish, nuts and seeds, canola oil, olive oil -small amounts of low fat or non fat dairy -small amounts of100 % whole grains - check the lables -drink plenty of water  AVOID: -SUGAR, sweets, anything with added sugar, corn syrup or sweeteners - must read labels as even foods advertised as "healthy" often are loaded with sugar -if you must have a sweetener, small amounts of stevia may be best -sweetened beverages and artificially sweetened beverages -simple starches (rice, bread, potatoes, pasta, chips, etc - small amounts of 100% whole grains are ok) -red meat, pork, butter -fried foods, fast food, processed food, excessive dairy, eggs and coconut.  3)Get at least 150 minutes of sweaty aerobic exercise per week.  4)Reduce stress - consider counseling, meditation and relaxation to balance other aspects of your life.          Lucretia Kern, DO

## 2017-10-22 ENCOUNTER — Encounter: Payer: Self-pay | Admitting: Family Medicine

## 2017-10-22 ENCOUNTER — Ambulatory Visit (INDEPENDENT_AMBULATORY_CARE_PROVIDER_SITE_OTHER): Payer: Self-pay | Admitting: Family Medicine

## 2017-10-22 VITALS — BP 118/74 | HR 84 | Temp 98.5°F | Ht 72.0 in | Wt 310.9 lb

## 2017-10-22 DIAGNOSIS — K5100 Ulcerative (chronic) pancolitis without complications: Secondary | ICD-10-CM

## 2017-10-22 DIAGNOSIS — R739 Hyperglycemia, unspecified: Secondary | ICD-10-CM

## 2017-10-22 DIAGNOSIS — I10 Essential (primary) hypertension: Secondary | ICD-10-CM

## 2017-10-22 DIAGNOSIS — F3342 Major depressive disorder, recurrent, in full remission: Secondary | ICD-10-CM

## 2017-10-22 DIAGNOSIS — E785 Hyperlipidemia, unspecified: Secondary | ICD-10-CM

## 2017-10-22 LAB — CBC
HCT: 42.2 % (ref 39.0–52.0)
HEMOGLOBIN: 14.3 g/dL (ref 13.0–17.0)
MCHC: 33.8 g/dL (ref 30.0–36.0)
MCV: 91.8 fl (ref 78.0–100.0)
PLATELETS: 220 10*3/uL (ref 150.0–400.0)
RBC: 4.6 Mil/uL (ref 4.22–5.81)
RDW: 14.1 % (ref 11.5–15.5)
WBC: 6.8 10*3/uL (ref 4.0–10.5)

## 2017-10-22 LAB — BASIC METABOLIC PANEL
BUN: 14 mg/dL (ref 6–23)
CO2: 27 meq/L (ref 19–32)
Calcium: 9.1 mg/dL (ref 8.4–10.5)
Chloride: 103 mEq/L (ref 96–112)
Creatinine, Ser: 0.74 mg/dL (ref 0.40–1.50)
GFR: 114.74 mL/min (ref >60.00)
GLUCOSE: 96 mg/dL (ref 70–99)
POTASSIUM: 4.5 meq/L (ref 3.5–5.1)
SODIUM: 139 meq/L (ref 135–145)

## 2017-10-22 LAB — CHOLESTEROL, TOTAL: Cholesterol: 182 mg/dL (ref 0–200)

## 2017-10-22 LAB — HEMOGLOBIN A1C: Hgb A1c MFr Bld: 5.3 % (ref 4.6–6.5)

## 2017-10-22 LAB — HDL CHOLESTEROL: HDL: 45.7 mg/dL (ref >39.00)

## 2017-10-22 MED ORDER — FINASTERIDE 1 MG PO TABS: ORAL_TABLET | ORAL | 3 refills | Status: DC

## 2017-10-22 NOTE — Patient Instructions (Signed)
BEFORE YOU LEAVE: -Labs -follow up: Physical in about 4-6 months  We have ordered labs or studies at this visit. It can take up to 1-2 weeks for results and processing. IF results require follow up or explanation, we will call you with instructions. Clinically stable results will be released to your [facility]. If you have not heard from [place] or cannot find your results in [facility] in 2 weeks please contact our office at [phone].  If you are not yet signed up for [facility], please consider signing up.   We recommend the following healthy lifestyle for LIFE: 1) Small portions. But, make sure to get regular (at least 3 per day), healthy meals and small healthy snacks if needed.  2) Eat a healthy clean diet.   TRY TO EAT: -at least 5-7 servings of low sugar, colorful, and nutrient rich vegetables per day (not corn, potatoes or bananas.) -berries are the best choice if you wish to eat fruit (only eat small amounts if trying to reduce weight)  -lean meets (fish, white meat of chicken or [place]) -vegan proteins for some meals - beans or tofu, whole grains, nuts and seeds -Replace bad fats with good fats - good fats include: fish, nuts and seeds, canola oil, olive oil -small amounts of low fat or non fat dairy -small amounts of100 % whole grains - check the lables -drink plenty of water  AVOID: -SUGAR, sweets, anything with added sugar, corn syrup or sweeteners - must read labels as even foods advertised as "healthy" often are loaded with sugar -if you must have a sweetener, small amounts of stevia may be best -sweetened beverages and artificially sweetened beverages -simple starches (rice, bread, potatoes, pasta, chips, etc - small amounts of 100% whole grains are ok) -red meat, pork, butter -fried foods, fast food, processed food, excessive dairy, eggs and coconut.  3)Get at least 150 minutes of sweaty aerobic exercise per week.  4)Reduce stress - consider counseling, meditation and  relaxation to balance other aspects of your life.

## 2017-11-07 ENCOUNTER — Ambulatory Visit (INDEPENDENT_AMBULATORY_CARE_PROVIDER_SITE_OTHER): Payer: PRIVATE HEALTH INSURANCE | Admitting: Gastroenterology

## 2017-11-07 ENCOUNTER — Encounter: Payer: Self-pay | Admitting: Gastroenterology

## 2017-11-07 VITALS — BP 132/70 | HR 76 | Ht 73.0 in | Wt 308.1 lb

## 2017-11-07 DIAGNOSIS — K5100 Ulcerative (chronic) pancolitis without complications: Secondary | ICD-10-CM | POA: Diagnosis not present

## 2017-11-07 MED ORDER — SULFASALAZINE 500 MG PO TBEC: DELAYED_RELEASE_TABLET | ORAL | 3 refills | Status: DC

## 2017-11-07 NOTE — Patient Instructions (Signed)
We have sent the following medications to your pharmacy for you to pick up at your convenience:Sulfasalazine.  You will be due for a recall colonoscopy in 6/20. We will send you a reminder in the mail when it gets closer to that time.    If you are age 60 or older, your body mass index should be between 23-30. Your Body mass index is 40.65 kg/m. If this is out of the aforementioned range listed, please consider follow up with your Primary Care Provider.  If you are age 4 or younger, your body mass index should be between 19-25. Your Body mass index is 40.65 kg/m. If this is out of the aformentioned range listed, please consider follow up with your Primary Care Provider.

## 2017-11-07 NOTE — Progress Notes (Signed)
    History of Present Illness: This is a 62 year old male with ulcerative colitis.  He has no ongoing gastrointestinal complaints.  He feels he has been doing well for the past few years.  He denies diarrhea and rectal bleeding.  CBC, BMET in 10/2017 were normal.   Current Medications, Allergies, Past Medical History, Past Surgical History, Family History and Social History were reviewed in [employer] record.  Physical Exam: General: Well developed, well nourished, no acute distress Head: Normocephalic and atraumatic Eyes:  sclerae anicteric, EOMI Ears: Normal auditory acuity Mouth: No deformity or lesions Lungs: Clear throughout to auscultation Heart: Regular rate and rhythm; no murmurs, rubs or bruits Abdomen: Soft, non tender and non distended. No masses, hepatosplenomegaly or hernias noted. Normal Bowel sounds Rectal: not done Musculoskeletal: Symmetrical with no gross deformities  Pulses:  Normal pulses noted Extremities: No clubbing, cyanosis, edema or deformities noted Neurological: Alert oriented x 4, grossly nonfocal Psychological:  Alert and cooperative. Normal mood and affect  Assessment and Recommendations:  1.  Ulcerative colitis.  Surveillance colonoscopy in 02/2019.  Refill sulfasalazine 1.5 g twice daily. REV in 1 year.

## 2017-11-20 ENCOUNTER — Other Ambulatory Visit: Payer: Self-pay | Admitting: *Deleted

## 2017-11-20 MED ORDER — AMLODIPINE BESY-BENAZEPRIL HCL 10-40 MG PO CAPS: 1.0000 | ORAL_CAPSULE | Freq: Every day | ORAL | 1 refills | Status: DC

## 2018-03-09 ENCOUNTER — Other Ambulatory Visit: Payer: Self-pay | Admitting: Family Medicine

## 2018-04-22 ENCOUNTER — Encounter: Payer: Self-pay | Admitting: Family Medicine

## 2018-06-03 ENCOUNTER — Other Ambulatory Visit: Payer: Self-pay | Admitting: Family Medicine

## 2018-06-17 ENCOUNTER — Encounter: Payer: Self-pay | Admitting: Family Medicine

## 2018-07-11 ENCOUNTER — Ambulatory Visit (INDEPENDENT_AMBULATORY_CARE_PROVIDER_SITE_OTHER): Payer: Managed Care, Other (non HMO) | Admitting: Family Medicine

## 2018-07-11 ENCOUNTER — Encounter: Payer: Self-pay | Admitting: Family Medicine

## 2018-07-11 VITALS — BP 122/78 | HR 85 | Temp 98.9°F | Ht 73.0 in | Wt 321.9 lb

## 2018-07-11 DIAGNOSIS — I10 Essential (primary) hypertension: Secondary | ICD-10-CM

## 2018-07-11 DIAGNOSIS — Z0000 Encounter for general adult medical examination without abnormal findings: Secondary | ICD-10-CM | POA: Diagnosis not present

## 2018-07-11 DIAGNOSIS — E785 Hyperlipidemia, unspecified: Secondary | ICD-10-CM | POA: Diagnosis not present

## 2018-07-11 DIAGNOSIS — Z23 Encounter for immunization: Secondary | ICD-10-CM

## 2018-07-11 DIAGNOSIS — G4733 Obstructive sleep apnea (adult) (pediatric): Secondary | ICD-10-CM

## 2018-07-11 DIAGNOSIS — R739 Hyperglycemia, unspecified: Secondary | ICD-10-CM

## 2018-07-11 LAB — LIPID PANEL
Cholesterol: 184 mg/dL (ref 0–200)
HDL: 53.6 mg/dL (ref >39.00)
LDL Cholesterol: 91 mg/dL (ref 0–99)
NONHDL: 130.26
Total CHOL/HDL Ratio: 3
Triglycerides: 198 mg/dL — ABNORMAL HIGH (ref 0.0–149.0)
VLDL: 39.6 mg/dL (ref 0.0–40.0)

## 2018-07-11 LAB — CBC
HEMATOCRIT: 43.9 % (ref 39.0–52.0)
HEMOGLOBIN: 15.1 g/dL (ref 13.0–17.0)
MCHC: 34.3 g/dL (ref 30.0–36.0)
MCV: 90.1 fl (ref 78.0–100.0)
Platelets: 236 10*3/uL (ref 150.0–400.0)
RBC: 4.88 Mil/uL (ref 4.22–5.81)
RDW: 13.9 % (ref 11.5–15.5)
WBC: 7.3 10*3/uL (ref 4.0–10.5)

## 2018-07-11 LAB — BASIC METABOLIC PANEL
BUN: 15 mg/dL (ref 6–23)
CO2: 27 mEq/L (ref 19–32)
Calcium: 9.1 mg/dL (ref 8.4–10.5)
Chloride: 101 mEq/L (ref 96–112)
Creatinine, Ser: 0.71 mg/dL (ref 0.40–1.50)
GFR: 120.07 mL/min (ref >60.00)
Glucose, Bld: 104 mg/dL — ABNORMAL HIGH (ref 70–99)
POTASSIUM: 4.2 meq/L (ref 3.5–5.1)
SODIUM: 136 meq/L (ref 135–145)

## 2018-07-11 LAB — HEMOGLOBIN A1C: HEMOGLOBIN A1C: 5.6 % (ref 4.6–6.5)

## 2018-07-11 NOTE — Progress Notes (Signed)
HPI:  Using dictation device. Unfortunately this device frequently misinterprets words/phrases.  -Concerns and/or follow up today:  Richard [name] is a pleasant 62 y.o. here for follow up and his annual exam. Chronic medical problems summarized below were reviewed for changes and stability and were updated as needed below. These issues and their treatment remain stable for the most part. Reports doing well. plans to see his urologist for prostate screening - declines here. walking dog daily and doing yoga, but admits diet is poor and plans to start eating healthier. Reports mood is good. Continues blood pressure, cholesterol, depession Denies CP, SOB, DOE, treatment intolerance or new symptoms. Due for labs and flu vaccine, phq9  Anxiety and Depression: -chronic, mild intermittent, worsened in 2016 -wellbutrin worsened symptoms remotely -better with CBT (DR. 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 SFuller Planfor management -had colonoscopy 02/2017 with 2 year repeat advised -Denies: abd pain, fevers, blood in stools    -Diet: variety of foods, balance and well rounded, larger portion sizes -Exercise: no regular exercise -Diabetes and Dyslipidemia Screening: -Hx of HTN: no -Vaccines: UTD -wants STI testing, Hep C screening (if born 160-1963: no -FH colon or prstate ca: see FH Last colon cancer screening: UTD, sees Dr. SLowella Dell Last prostate ca screening: does with urologist, declined here today -Alcohol, Tobacco, drug use: see social history  Review of  Systems - no fevers, unintentional weight loss, vision loss, hearing loss, chest pain, sob, hemoptysis, melena, hematochezia, hematuria, genital discharge, changing or concerning skin lesions, bleeding, bruising, loc, thoughts of self harm or SI  Past Medical History:  Diagnosis Date  . ALLERGIC RHINITIS CAUSE UNSPECIFIED 05/21/2009  . Allergy   . BACK PAIN, LUMBAR, WITH RADICULOPATHY 03/18/2010  . COLITIS, ULCERATIVE 05/28/2007  . Glaucoma    2 yrs ago eye md told does NOT have glaucoma   . HYPERLIPIDEMIA 05/28/2007  . HYPERTENSION 05/28/2007  . HYPOGONADISM, MALE 07/24/2008  . Internal hemorrhoids   . 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 Recurrent major depressive disorder, in full remission ([facility] [place] 10/07/2015   with GAD   . SLEEP APNEA 12/18/2008   wears CPAP  . Sleep apnea    wears cpap   . Tubular adenoma of colon     Past Surgical History:  Procedure Laterality Date  . COLONOSCOPY    . NASAL SINUS SURGERY    . POLYPECTOMY      Family History  Problem Relation Age of Onset  . Hyperlipidemia Father   . Hypertension Father   . Heart disease Father   . Breast cancer Mother        <50  . Ulcerative colitis Cousin        2 cousins paternal side  . Colon polyps Cousin   . Colon cancer Neg Hx   . Esophageal cancer Neg Hx   . Stomach cancer Neg Hx   . Rectal cancer Neg Hx     Social History   Socioeconomic History  . Marital status: Single    Spouse name: Not on file  . Number of children: 0  . Years of education: Not on file  .  Highest education level: Not on file  Occupational History  . Occupation: Architectural technologist: [employer]  Social Needs  . Financial resource strain: Not on file  . Food insecurity:    Worry: Not on file    Inability: Not on file  . Transportation needs:    Medical: Not on file    Non-medical: Not on file  Tobacco Use  . Smoking status: Former Smoker    Types: Cigarettes    Last attempt to quit: 10/21/1999    Years since  quitting: 18.7  . Smokeless tobacco: Never Used  Substance and Sexual Activity  . Alcohol use: Yes    Alcohol/week: 7.0 standard drinks    Types: 7 Standard drinks or equivalent per week    Comment: 1 drink per day at most  . Drug use: No  . Sexual activity: Not on file  Lifestyle  . Physical activity:    Days per week: Not on file    Minutes per session: Not on file  . Stress: Not on file  Relationships  . Social connections:    Talks on phone: Not on file    Gets together: Not on file    Attends religious service: Not on file    Active member of club or organization: Not on file    Attends meetings of clubs or organizations: Not on file    Relationship status: Not on file  Other Topics Concern  . 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  amLODipine-benazepril (LOTREL) 10-40 MG capsule, TAKE 1 CAPSULE BY MOUTH EVERY DAY, Disp: 90 capsule, Rfl: 0 .  escitalopram (LEXAPRO) 10 MG tablet, TAKE 1 TABLET BY MOUTH EVERY DAY, Disp: 30 tablet, Rfl: 5 .  finasteride (PROPECIA) 1 MG tablet, One tab daily, Disp: 90 tablet, Rfl: 3 .  Multiple Vitamins-Iron (MULTIVITAMIN/IRON) TABS, Take 1 tablet by mouth daily.  , Disp: , Rfl:  .  NASONEX 50 MCG/ACT nasal spray, PLACE 2 SQUIRTS IN EACH NOSTRIL DAILY (Patient taking differently: PLACE 2 SQUIRTS IN EACH NOSTRIL AS NEEDED), Disp: 17 g, Rfl: 11 .  rosuvastatin (CRESTOR) 20 MG tablet, Take 1 tablet (20 mg total) by mouth daily., Disp: 90 tablet, Rfl: 1 .  sulfaSALAzine (AZULFIDINE) 500 MG EC tablet, TAKE 3 TABLETS BY MOUTH 2 TIMES DAILY., Disp: 540 tablet, Rfl: 3  EXAM:  Vitals:   07/11/18 0923  BP: 122/78  Pulse: 85  Temp: 98.9 F (37.2 C)  TempSrc: Oral  Weight: (!) 321 lb 14.4 oz (146 kg)  Height: 6' 1"  (1.854 m)    Estimated body mass index is  42.47 kg/m as calculated from the following:   Height as of this encounter: 6' 1"  (1.854 m).   Weight as of this encounter: 321 lb 14.4 oz (146 kg).  GENERAL: vitals reviewed and listed below, alert, oriented, appears well hydrated and in no acute distress  HEENT: head atraumatic, PERRLA, normal appearance of eyes, ears, nose and mouth. moist mucus membranes.  NECK: supple, no masses or lymphadenopathy  LUNGS: clear to auscultation bilaterally, no rales, rhonchi or wheeze  CV: HRRR, no peripheral edema or cyanosis, normal pedal pulses  ABDOMEN: bowel sounds normal, soft, non tender to  palpation, no masses, no rebound or guarding  GU: declined  SKIN: no rash or abnormal lesions  MS: normal gait, moves all extremities normally  NEURO: normal gait, speech and thought processing grossly intact, muscle tone grossly intact throughout  PSYCH: normal affect, pleasant and cooperative  ASSESSMENT AND PLAN:  Discussed the following assessment and plan:  PREVENTIVE EXAM: -Discussed and advised all [place] preventive services health task force level A and B recommendations for age, sex and risks. -Advised at least 150 minutes of exercise per week and a healthy diet with avoidance of (less then 1 serving per week) processed foods, white starches, red meat, fast foods and sweets and consisting of: * 5-9 servings of fresh fruits and vegetables (not corn or potatoes) *nuts and seeds, beans *olives and olive oil *lean meats such as fish and white chicken  *whole grains -labs, studies and vaccines per orders this encounter  2. Morbid obesity ([place]) -lifestyle recs discussed at length -advised 20lb weight reduction over next 3-6 months  3. Essential hypertension -lifestyle recs -cont current meds - Basic metabolic panel - CBC  4. Hyperlipidemia, unspecified hyperlipidemia type -cont meds, lifestyle recs - Lipid panel  5. Hyperglycemia - Hemoglobin A1c  6. OSA (obstructive sleep  apnea) -on CPAP   Patient Instructions  BEFORE YOU LEAVE: -flu shot -update phq9 -labs -follow up: 3 months  Goal to lose 20 lbs in the next 3-6 months.  Healthy low sugar diet - Mediterranean and DASH are good options.  Regular aerobic exercise - at least 150 minutes of sweaty exercise per week.  We have ordered labs or studies at this visit. It can take up to 1-2 weeks for results and processing. IF results require follow up or explanation, we will call you with instructions. Clinically stable results will be released to your [facility]. If you have not heard from [place] or cannot find your results in [facility] in 2 weeks please contact our office at [phone].  If you are not yet signed up for [facility], please consider signing up.   We recommend the following healthy lifestyle for LIFE: 1) Small portions. But, make sure to get regular (at least 3 per day), healthy meals and small healthy snacks if needed.  2) Eat a healthy clean diet.   TRY TO EAT: -at least 5-7 servings of low sugar, colorful, and nutrient rich vegetables per day (not corn, potatoes or bananas.) -berries are the best choice if you wish to eat fruit (only eat small amounts if trying to reduce weight)  -lean meets (fish, white meat of chicken or [place]) -vegan proteins for some meals - beans or tofu, whole grains, nuts and seeds -Replace bad fats with good fats - good fats include: fish, nuts and seeds, canola oil, olive oil -small amounts of low fat or non fat dairy -small amounts of100 % whole grains - check the lables -drink plenty of water  AVOID: -SUGAR, sweets, anything with added sugar, corn syrup or sweeteners - must read labels as even foods advertised as "healthy" often are loaded with sugar -if you must have a sweetener, small amounts of stevia may be best -sweetened beverages and artificially sweetened beverages -simple starches (rice, bread, potatoes, pasta, chips, etc - small amounts of 100% whole  grains are ok) -red meat, pork, butter -fried foods, fast food, processed food, excessive dairy, eggs and coconut.  3)Get at least 150 minutes of sweaty aerobic exercise per week.  4)Reduce stress - consider counseling, meditation and relaxation to balance  other aspects of your life.    Preventive Care 40-64 Years, Male Preventive care refers to lifestyle choices and visits with your health care provider that can promote health and wellness. What does preventive care include?  A yearly physical exam. This is also called an annual well check.  Dental exams once or twice a year.  Routine eye exams. Ask your health care provider how often you should have your eyes checked.  Personal lifestyle choices, including: ? Daily care of your teeth and gums. ? Regular physical activity. ? Eating a healthy diet. ? Avoiding tobacco and drug use. ? Limiting alcohol use. ? Practicing safe sex.  What happens during an annual well check? The services and screenings done by your health care provider during your annual well check will depend on your age, overall health, lifestyle risk factors, and family history of disease. Counseling Your health care provider may ask you questions about your:  Alcohol use.  Tobacco use.  Drug use.  Emotional well-being.  Home and relationship well-being.  Sexual activity.  Eating habits.  Work and work Statistician.  Screening You may have the following tests or measurements:  Height, weight, and BMI.  Blood pressure.  Lipid and cholesterol levels. These may be checked every 5 years, or more frequently if you are over 45 years old.  Skin check.  Lung cancer screening. You may have this screening every year starting at age 44 if you have a 30-pack-year history of smoking and currently smoke or have quit within the past 15 years.  Fecal occult blood test (FOBT) of the stool. You may have this test every year starting at age 53.  Flexible  sigmoidoscopy or colonoscopy. You may have a sigmoidoscopy every 5 years or a colonoscopy every 10 years starting at age 86.  Prostate cancer screening. Recommendations will vary depending on your family history and other risks.  Hepatitis C blood test.  Hepatitis B blood test.  Sexually transmitted disease (STD) testing.  Diabetes screening. This is done by checking your blood sugar (glucose) after you have not eaten for a while (fasting). You may have this done every 1-3 years.  Discuss your test results, treatment options, and if necessary, the need for more tests with your health care provider. Vaccines Your health care provider may recommend certain vaccines, such as:  Influenza vaccine. This is recommended every year.  Tetanus, diphtheria, and acellular pertussis (Tdap, Td) vaccine. You may need a Td booster every 10 years.  Varicella vaccine. You may need this if you have not been vaccinated.  Zoster vaccine. You may need this after age 75.  Measles, mumps, and rubella (MMR) vaccine. You may need at least one dose of MMR if you were born in 1957 or later. You may also need a second dose.  Pneumococcal 13-valent conjugate (PCV13) vaccine. You may need this if you have certain conditions and have not been vaccinated.  Pneumococcal polysaccharide (PPSV23) vaccine. You may need one or two doses if you smoke cigarettes or if you have certain conditions.  Meningococcal vaccine. You may need this if you have certain conditions.  Hepatitis A vaccine. You may need this if you have certain conditions or if you travel or work in places where you may be exposed to hepatitis A.  Hepatitis B vaccine. You may need this if you have certain conditions or if you travel or work in places where you may be exposed to hepatitis B.  Haemophilus influenzae type b (Hib) vaccine. You  may need this if you have certain risk factors.  Talk to your health care provider about which screenings and  vaccines you need and how often you need them. This information is not intended to replace advice given to you by your health care provider. Make sure you discuss any questions you have with your health care provider. Document Released: 09/17/2015 Document Revised: 05/10/2016 Document Reviewed: 06/22/2015 Elsevier Interactive Patient Education  2018 [employer].         No follow-ups on file.   Lucretia Kern, DO

## 2018-07-11 NOTE — Patient Instructions (Signed)
BEFORE YOU LEAVE: -flu shot -update phq9 -labs -follow up: 3 months  Goal to lose 20 lbs in the next 3-6 months.  Healthy low sugar diet - Mediterranean and DASH are good options.  Regular aerobic exercise - at least 150 minutes of sweaty exercise per week.  We have ordered labs or studies at this visit. It can take up to 1-2 weeks for results and processing. IF results require follow up or explanation, we will call you with instructions. Clinically stable results will be released to your [facility]. If you have not heard from [place] or cannot find your results in [facility] in 2 weeks please contact our office at [phone].  If you are not yet signed up for [facility], please consider signing up.   We recommend the following healthy lifestyle for LIFE: 1) Small portions. But, make sure to get regular (at least 3 per day), healthy meals and small healthy snacks if needed.  2) Eat a healthy clean diet.   TRY TO EAT: -at least 5-7 servings of low sugar, colorful, and nutrient rich vegetables per day (not corn, potatoes or bananas.) -berries are the best choice if you wish to eat fruit (only eat small amounts if trying to reduce weight)  -lean meets (fish, white meat of chicken or [place]) -vegan proteins for some meals - beans or tofu, whole grains, nuts and seeds -Replace bad fats with good fats - good fats include: fish, nuts and seeds, canola oil, olive oil -small amounts of low fat or non fat dairy -small amounts of100 % whole grains - check the lables -drink plenty of water  AVOID: -SUGAR, sweets, anything with added sugar, corn syrup or sweeteners - must read labels as even foods advertised as "healthy" often are loaded with sugar -if you must have a sweetener, small amounts of stevia may be best -sweetened beverages and artificially sweetened beverages -simple starches (rice, bread, potatoes, pasta, chips, etc - small amounts of 100% whole grains are ok) -red meat, pork, butter -fried  foods, fast food, processed food, excessive dairy, eggs and coconut.  3)Get at least 150 minutes of sweaty aerobic exercise per week.  4)Reduce stress - consider counseling, meditation and relaxation to balance other aspects of your life.    Preventive Care 40-64 Years, Male Preventive care refers to lifestyle choices and visits with your health care provider that can promote health and wellness. What does preventive care include?  A yearly physical exam. This is also called an annual well check.  Dental exams once or twice a year.  Routine eye exams. Ask your health care provider how often you should have your eyes checked.  Personal lifestyle choices, including: ? Daily care of your teeth and gums. ? Regular physical activity. ? Eating a healthy diet. ? Avoiding tobacco and drug use. ? Limiting alcohol use. ? Practicing safe sex.  What happens during an annual well check? The services and screenings done by your health care provider during your annual well check will depend on your age, overall health, lifestyle risk factors, and family history of disease. Counseling Your health care provider may ask you questions about your:  Alcohol use.  Tobacco use.  Drug use.  Emotional well-being.  Home and relationship well-being.  Sexual activity.  Eating habits.  Work and work Statistician.  Screening You may have the following tests or measurements:  Height, weight, and BMI.  Blood pressure.  Lipid and cholesterol levels. These may be checked every 5 years, or more frequently  if you are over 61 years old.  Skin check.  Lung cancer screening. You may have this screening every year starting at age 45 if you have a 30-pack-year history of smoking and currently smoke or have quit within the past 15 years.  Fecal occult blood test (FOBT) of the stool. You may have this test every year starting at age 62.  Flexible sigmoidoscopy or colonoscopy. You may have a  sigmoidoscopy every 5 years or a colonoscopy every 10 years starting at age 87.  Prostate cancer screening. Recommendations will vary depending on your family history and other risks.  Hepatitis C blood test.  Hepatitis B blood test.  Sexually transmitted disease (STD) testing.  Diabetes screening. This is done by checking your blood sugar (glucose) after you have not eaten for a while (fasting). You may have this done every 1-3 years.  Discuss your test results, treatment options, and if necessary, the need for more tests with your health care provider. Vaccines Your health care provider may recommend certain vaccines, such as:  Influenza vaccine. This is recommended every year.  Tetanus, diphtheria, and acellular pertussis (Tdap, Td) vaccine. You may need a Td booster every 10 years.  Varicella vaccine. You may need this if you have not been vaccinated.  Zoster vaccine. You may need this after age 55.  Measles, mumps, and rubella (MMR) vaccine. You may need at least one dose of MMR if you were born in 1957 or later. You may also need a second dose.  Pneumococcal 13-valent conjugate (PCV13) vaccine. You may need this if you have certain conditions and have not been vaccinated.  Pneumococcal polysaccharide (PPSV23) vaccine. You may need one or two doses if you smoke cigarettes or if you have certain conditions.  Meningococcal vaccine. You may need this if you have certain conditions.  Hepatitis A vaccine. You may need this if you have certain conditions or if you travel or work in places where you may be exposed to hepatitis A.  Hepatitis B vaccine. You may need this if you have certain conditions or if you travel or work in places where you may be exposed to hepatitis B.  Haemophilus influenzae type b (Hib) vaccine. You may need this if you have certain risk factors.  Talk to your health care provider about which screenings and vaccines you need and how often you need  them. This information is not intended to replace advice given to you by your health care provider. Make sure you discuss any questions you have with your health care provider. Document Released: 09/17/2015 Document Revised: 05/10/2016 Document Reviewed: 06/22/2015 Elsevier Interactive Patient Education  [employer].

## 2018-08-21 ENCOUNTER — Other Ambulatory Visit: Payer: Self-pay | Admitting: Family Medicine

## 2018-09-19 ENCOUNTER — Other Ambulatory Visit: Payer: Self-pay | Admitting: Family Medicine

## 2018-10-10 ENCOUNTER — Ambulatory Visit: Payer: Self-pay | Admitting: Family Medicine

## 2018-10-13 ENCOUNTER — Other Ambulatory Visit: Payer: Self-pay | Admitting: Family Medicine

## 2018-11-07 ENCOUNTER — Ambulatory Visit: Payer: Self-pay | Admitting: Family Medicine

## 2018-11-11 ENCOUNTER — Other Ambulatory Visit: Payer: Self-pay | Admitting: Family Medicine

## 2019-01-04 ENCOUNTER — Other Ambulatory Visit: Payer: Self-pay | Admitting: Family Medicine

## 2019-01-27 ENCOUNTER — Other Ambulatory Visit: Payer: Self-pay | Admitting: Gastroenterology

## 2019-02-04 ENCOUNTER — Other Ambulatory Visit: Payer: Self-pay | Admitting: Family Medicine

## 2019-02-20 ENCOUNTER — Other Ambulatory Visit: Payer: Self-pay | Admitting: Family Medicine

## 2019-02-20 ENCOUNTER — Encounter: Payer: Self-pay | Admitting: Gastroenterology

## 2019-03-04 ENCOUNTER — Other Ambulatory Visit: Payer: Self-pay | Admitting: Family Medicine

## 2019-03-18 ENCOUNTER — Other Ambulatory Visit: Payer: Self-pay | Admitting: Family Medicine

## 2019-03-27 MED ORDER — FINASTERIDE 1 MG PO TABS: 1.0000 mg | ORAL_TABLET | Freq: Every day | ORAL | 0 refills | Status: DC

## 2019-03-27 NOTE — Addendum Note (Signed)
Addended by: Agnes Lawrence on: 03/27/2019 11:14 AM   Modules accepted: Orders

## 2019-03-28 ENCOUNTER — Telehealth: Payer: Self-pay | Admitting: Family Medicine

## 2019-03-28 NOTE — Telephone Encounter (Signed)
Ponder with transfer.  [name]. [name], MD 03/28/2019 9:38 PM

## 2019-03-28 NOTE — Telephone Encounter (Signed)
Please advise on transfer, patient wanted Dr. Yong Channel however, Dr. Yong Channel is not accepting at this time. Approval from Dr. Maudie Mercury is not needed due to not having a PCP panel currently. Please advise.   Copied from [place]. Topic: Appointment Scheduling - Transfer of Care >> Mar 28, 2019  1:15 PM Yvette Rack wrote: Pt is requesting to transfer FROM: Dr. Colin Benton Pt is requesting to transfer TO: Dr. Garret Reddish Reason for requested transfer: Pt stated he was notified that Dr. Maudie Mercury will no longer be practicing and he has af ew friends who recommended Dr. Yong Channel  Send CRM to patient's current PCP (transferring FROM).

## 2019-03-31 NOTE — Telephone Encounter (Signed)
Patient is scheduled. No further action required. No need to route note, only for documentation purposes.

## 2019-04-24 ENCOUNTER — Ambulatory Visit (INDEPENDENT_AMBULATORY_CARE_PROVIDER_SITE_OTHER): Payer: Managed Care, Other (non HMO) | Admitting: Family Medicine

## 2019-04-24 ENCOUNTER — Other Ambulatory Visit: Payer: Self-pay

## 2019-04-24 ENCOUNTER — Encounter: Payer: Self-pay | Admitting: Family Medicine

## 2019-04-24 VITALS — BP 124/80 | HR 79 | Temp 98.3°F | Ht 73.0 in | Wt 316.0 lb

## 2019-04-24 DIAGNOSIS — K5100 Ulcerative (chronic) pancolitis without complications: Secondary | ICD-10-CM | POA: Diagnosis not present

## 2019-04-24 DIAGNOSIS — G4700 Insomnia, unspecified: Secondary | ICD-10-CM | POA: Insufficient documentation

## 2019-04-24 DIAGNOSIS — E785 Hyperlipidemia, unspecified: Secondary | ICD-10-CM

## 2019-04-24 DIAGNOSIS — F3342 Major depressive disorder, recurrent, in full remission: Secondary | ICD-10-CM | POA: Diagnosis not present

## 2019-04-24 DIAGNOSIS — I10 Essential (primary) hypertension: Secondary | ICD-10-CM | POA: Diagnosis not present

## 2019-04-24 DIAGNOSIS — Z23 Encounter for immunization: Secondary | ICD-10-CM

## 2019-04-24 DIAGNOSIS — Z6841 Body Mass Index (BMI) 40.0 and over, adult: Secondary | ICD-10-CM

## 2019-04-24 DIAGNOSIS — L649 Androgenic alopecia, unspecified: Secondary | ICD-10-CM

## 2019-04-24 MED ORDER — FINASTERIDE 1 MG PO TABS: 1.0000 mg | ORAL_TABLET | Freq: Every day | ORAL | 3 refills | Status: DC

## 2019-04-24 MED ORDER — TRAZODONE HCL 50 MG PO TABS: 25.0000 mg | ORAL_TABLET | Freq: Every evening | ORAL | 3 refills | Status: DC | PRN

## 2019-04-24 NOTE — Assessment & Plan Note (Signed)
Doing well.  Would like to come off Lexapro.  I think this is reasonable.  He will wean off over the next couple weeks.  He will let me know if he has any recurrence of symptoms.

## 2019-04-24 NOTE — Assessment & Plan Note (Signed)
Stable.  Continue management per GI.

## 2019-04-24 NOTE — Patient Instructions (Signed)
It was very nice to see you today!  Please try the trazodone to help with sleep.   I am okay with you weaning of Lexapro.  Please take half a pill for a week or so, then come off.  I will refill your finasteride today.  Please come back to see me in a few months for your physical with blood work.  Let me know if you need to come in sooner.    Take care, Dr [name]  Please try these tips to maintain a healthy lifestyle:   Eat at least 3 REAL meals and 1-2 snacks per day.  Aim for no more than 5 hours between eating.  If you eat breakfast, please do so within one hour of getting up.    Obtain twice as many fruits/vegetables as protein or carbohydrate foods for both lunch and dinner. (Half of each meal should be fruits/vegetables, one quarter protein, and one quarter starchy carbs)   Cut down on sweet beverages. This includes juice, soda, and sweet tea.    Exercise at least 150 minutes every week.

## 2019-04-24 NOTE — Assessment & Plan Note (Signed)
Stable.  Finasteride refilled.

## 2019-04-24 NOTE — Progress Notes (Signed)
   Chief Complaint:  Richard [name] is a 63 y.o. male who presents today with a chief complaint of insomnia.   Assessment/Plan:  Androgenic alopecia Stable.  Finasteride refilled.  Recurrent major depressive disorder, in full remission ([place]) Doing well.  Would like to come off Lexapro.  I think this is reasonable.  He will wean off over the next couple weeks.  He will let me know if he has any recurrence of symptoms.  Ulcerative colitis ([place]) Stable.  Continue management per GI.  Essential hypertension At goal.  Continue amlodipine-benazepril 10-40 once daily.  Follow-up me for CPE in 3 to 6 months.  Hyperlipemia Continue Crestor 10 mg daily.  Check lipid panel next blood draw.  Insomnia Start trazodone 25 to 50 mg at night as needed.  Discussed potential side effects.     Subjective:  HPI:  He has had some difficulty with sleeping for the past several years.  Falls asleep okay however will sometimes wake up and have difficulty going back to sleep.  Has tried Ambien in the past which worked well however does not want take this every day.  Is never been on anything else for this in the past.  His stable, chronic medical conditions are outlined below:  # Essential Hypertension - On amlodipine-benazepril 10-40 once daily and tolerating well - ROS: No reported chest pain or shortness of breath  # Dyslipidemia - On crestor 70m daily and tolerating well - ROS: No reported myalgias.  # Depression - On lexapro 152mdaily and tolerating well - ROS: No reported SI or HI  # Androgenic Alopecia - On finasteride 17m79maily and tolerating well  % Ulcerative Colitis - Follows with GI - Dr StaFuller PlanOn sulfasalazine 1500m75mice daily  ROS: Per HPI  PMH: He reports that he quit smoking about 19 years ago. His smoking use included cigarettes. He has never used smokeless tobacco. He reports current alcohol use of about 7.0 standard drinks of alcohol per week. He reports that he  does not use drugs.      Objective:  Physical Exam: BP 124/80 (BP Location: Left Arm, Patient Position: Sitting, Cuff Size: Large)   Pulse 79   Temp 98.3 F (36.8 C) (Oral)   Ht 6' 1"  (1.854 m)   Wt (!) 316 lb (143.3 kg)   SpO2 96%   BMI 41.69 kg/m   Wt Readings from Last 3 Encounters:  04/24/19 (!) 316 lb (143.3 kg)  07/11/18 (!) 321 lb 14.4 oz (146 kg)  11/07/17 (!) 308 lb 2 oz (139.8 kg)  Gen: NAD, resting comfortably CV: Regular rate and rhythm with no murmurs appreciated Pulm: Normal work of breathing, clear to auscultation bilaterally with no crackles, wheezes, or rhonchi MSK: No edema, cyanosis, or clubbing noted Skin: Warm, dry Neuro: Grossly normal Psych: Normal affect and thought content     Caleb M. ParkJerline Pain 04/24/2019 2:56 PM

## 2019-04-24 NOTE — Assessment & Plan Note (Signed)
Continue Crestor 10 mg daily.  Check lipid panel next blood draw.

## 2019-04-24 NOTE — Assessment & Plan Note (Signed)
Start trazodone 25 to 50 mg at night as needed.  Discussed potential side effects.

## 2019-04-24 NOTE — Assessment & Plan Note (Signed)
At goal.  Continue amlodipine-benazepril 10-40 once daily.  Follow-up me for CPE in 3 to 6 months.

## 2019-05-18 ENCOUNTER — Other Ambulatory Visit: Payer: Self-pay | Admitting: Family Medicine

## 2019-05-19 ENCOUNTER — Other Ambulatory Visit: Payer: Self-pay | Admitting: Family Medicine

## 2019-05-21 ENCOUNTER — Encounter: Payer: Self-pay | Admitting: Gastroenterology

## 2019-05-21 ENCOUNTER — Other Ambulatory Visit: Payer: Self-pay | Admitting: Family Medicine

## 2019-05-30 ENCOUNTER — Encounter: Payer: Self-pay | Admitting: Family Medicine

## 2019-06-03 ENCOUNTER — Ambulatory Visit (AMBULATORY_SURGERY_CENTER): Payer: Self-pay | Admitting: *Deleted

## 2019-06-03 ENCOUNTER — Other Ambulatory Visit: Payer: Self-pay

## 2019-06-03 VITALS — Ht 73.0 in | Wt 315.0 lb

## 2019-06-03 DIAGNOSIS — Z86010 Personal history of colonic polyps: Secondary | ICD-10-CM

## 2019-06-03 DIAGNOSIS — K5100 Ulcerative (chronic) pancolitis without complications: Secondary | ICD-10-CM

## 2019-06-03 MED ORDER — SUPREP BOWEL PREP KIT 17.5-3.13-1.6 GM/177ML PO SOLN: 1.0000 | Freq: Once | ORAL | 0 refills | Status: AC

## 2019-06-03 NOTE — Progress Notes (Signed)
Patient denies any allergies to egg or soy products. Patient denies complications with anesthesia/sedation.  Patient denies oxygen use at home and denies diet medications. Emmi instructions for colonoscopy explained and given to patient. Suprep coupon given. Hx OSA-wears cpap nightly.

## 2019-06-04 ENCOUNTER — Encounter: Payer: Self-pay | Admitting: Gastroenterology

## 2019-06-11 ENCOUNTER — Encounter: Payer: Self-pay | Admitting: Family Medicine

## 2019-06-11 ENCOUNTER — Other Ambulatory Visit: Payer: Self-pay | Admitting: Family Medicine

## 2019-06-11 ENCOUNTER — Other Ambulatory Visit: Payer: Self-pay

## 2019-06-11 MED ORDER — ESCITALOPRAM OXALATE 10 MG PO TABS: 10.0000 mg | ORAL_TABLET | Freq: Every day | ORAL | 1 refills | Status: DC

## 2019-06-14 ENCOUNTER — Other Ambulatory Visit: Payer: Self-pay | Admitting: Family Medicine

## 2019-06-16 ENCOUNTER — Encounter: Payer: Self-pay | Admitting: Family Medicine

## 2019-06-16 ENCOUNTER — Other Ambulatory Visit: Payer: Self-pay

## 2019-06-16 ENCOUNTER — Telehealth: Payer: Self-pay

## 2019-06-16 MED ORDER — AMLODIPINE BESY-BENAZEPRIL HCL 10-40 MG PO CAPS: ORAL_CAPSULE | ORAL | 1 refills | Status: DC

## 2019-06-16 NOTE — Telephone Encounter (Signed)
Pt responded "no" to all screening questions °

## 2019-06-16 NOTE — Telephone Encounter (Signed)
Covid-19 screening questions   Do you now or have you had a fever in the last 14 days?  Do you have any respiratory symptoms of shortness of breath or cough now or in the last 14 days?  Do you have any family members or close contacts with diagnosed or suspected Covid-19 in the past 14 days?  Have you been tested for Covid-19 and found to be positive?       

## 2019-06-17 ENCOUNTER — Ambulatory Visit (AMBULATORY_SURGERY_CENTER): Payer: Managed Care, Other (non HMO) | Admitting: Gastroenterology

## 2019-06-17 ENCOUNTER — Other Ambulatory Visit: Payer: Self-pay | Admitting: Gastroenterology

## 2019-06-17 ENCOUNTER — Encounter: Payer: Self-pay | Admitting: Gastroenterology

## 2019-06-17 ENCOUNTER — Other Ambulatory Visit: Payer: Self-pay

## 2019-06-17 VITALS — BP 155/91 | HR 67 | Temp 98.9°F | Resp 13 | Ht 73.0 in | Wt 315.0 lb

## 2019-06-17 DIAGNOSIS — D120 Benign neoplasm of cecum: Secondary | ICD-10-CM

## 2019-06-17 DIAGNOSIS — K635 Polyp of colon: Secondary | ICD-10-CM | POA: Diagnosis not present

## 2019-06-17 DIAGNOSIS — D123 Benign neoplasm of transverse colon: Secondary | ICD-10-CM

## 2019-06-17 DIAGNOSIS — K5130 Ulcerative (chronic) rectosigmoiditis without complications: Secondary | ICD-10-CM | POA: Diagnosis not present

## 2019-06-17 DIAGNOSIS — Z86010 Personal history of colonic polyps: Secondary | ICD-10-CM

## 2019-06-17 DIAGNOSIS — D124 Benign neoplasm of descending colon: Secondary | ICD-10-CM

## 2019-06-17 MED ORDER — SODIUM CHLORIDE 0.9 % IV SOLN: 500.0000 mL | Freq: Once | INTRAVENOUS | Status: DC

## 2019-06-17 NOTE — Op Note (Signed)
[facility] Patient Name: Richard [name] Procedure Date: 06/17/2019 8:42 AM MRN: [id number] Endoscopist: Ladene Artist , MD Age: 63 Referring MD:  Date of Birth: [birth year] Gender: Male Account #: [id number] Procedure:                Colonoscopy Indications:              Surveillance: High risk for colon cancer and                            History of adenomatous polyps, last colonoscopy (<3                            yr), High risk colon cancer surveillance:                            Inflammatory bowel disease (unclassified) of 8 (or                            more) years duration with one-third (or more) of                            the colon involved Medicines:                Monitored Anesthesia Care Procedure:                Pre-Anesthesia Assessment:                           - Prior to the procedure, a History and Physical                            was performed, and patient medications and                            allergies were reviewed. The patient's tolerance of                            previous anesthesia was also reviewed. The risks                            and benefits of the procedure and the sedation                            options and risks were discussed with the patient.                            All questions were answered, and informed consent                            was obtained. Prior Anticoagulants: The patient has                            taken no previous anticoagulant or antiplatelet  agents. ASA Grade Assessment: II - A patient with                            mild systemic disease. After reviewing the risks                            and benefits, the patient was deemed in                            satisfactory condition to undergo the procedure.                           After obtaining informed consent, the colonoscope                            was passed under direct vision. Throughout the                         procedure, the patient's blood pressure, pulse, and                            oxygen saturations were monitored continuously. The                            Colonoscope was introduced through the anus and                            advanced to the the cecum, identified by                            appendiceal orifice and ileocecal valve. The                            ileocecal valve, appendiceal orifice, and rectum                            were photographed. The quality of the bowel                            preparation was good. The colonoscopy was performed                            without difficulty. The patient tolerated the                            procedure well. Scope In: 8:50:20 AM Scope Out: 9:22:00 AM Scope Withdrawal Time: 0 hours 26 minutes 0 seconds  Total Procedure Duration: 0 hours 31 minutes 40 seconds  Findings:                 The perianal and digital rectal examinations were                            normal.  Seven sessile polyps were found in the cecum (1),                            descending colon (4) and transverse colon (2). The                            polyps were 7 to 10 mm in size. These polyps were                            removed with a cold snare. Resection and retrieval                            were complete.                           Three sessile polyps were found in the descending                            colon (2) and transverse colon (1). The polyps were                            11 to 14 mm in size. These polyps were removed with                            a hot snare. Resection and retrieval were complete.                           Inflammation characterized by erythema, friability,                            granularity and loss of vascularity was found as                            medium patches surrounded by normal mucosa in the                            sigmoid colon, in the  descending colon and in the                            splenic flexure. This was mild in severity.                            Biopsies were taken with a cold forceps for                            histology.                           Internal hemorrhoids were found during                            retroflexion. The hemorrhoids were medium-sized and  Grade I (internal hemorrhoids that do not prolapse).                           The exam was otherwise without abnormality on                            direct and retroflexion views. Biopsies were taken                            with a cold forceps for histology. Complications:            No immediate complications. Estimated blood loss:                            None. Estimated Blood Loss:     Estimated blood loss: none. Impression:               - Seven 7 to 10 mm polyps in the cecum, in the                            descending colon and in the transverse colon,                            removed with a cold snare. Resected and retrieved.                           - Three 11 to 14 mm polyps in the descending colon                            and in the transverse colon, removed with a hot                            snare. Resected and retrieved.                           - Left-sided colitis. Inflammation was found in the                            sigmoid colon, in the descending colon and in the                            splenic flexure. This was mild in severity.                            Biopsied.                           - Internal hemorrhoids.                           - The examination was otherwise normal on direct                            and retroflexion views. Biopsied. Recommendation:           -  Repeat colonoscopy for surveillance based on                            pathology results.                           - Patient has a contact number available for                            emergencies.  The signs and symptoms of potential                            delayed complications were discussed with the                            patient. Return to normal activities tomorrow.                            Written discharge instructions were provided to the                            patient.                           - Resume previous diet.                           - Continue present medications.                           - Await pathology results.                           - No aspirin, ibuprofen, naproxen, or other                            non-steroidal anti-inflammatory drugs for 2 weeks                            after polyp removal. Ladene Artist, MD 06/17/2019 9:31:14 AM This report has been signed electronically.

## 2019-06-17 NOTE — Progress Notes (Signed)
To PACU, VSS. Report to [email or web address] 

## 2019-06-17 NOTE — Progress Notes (Signed)
Called to room to assist during endoscopic procedure.  Patient ID and intended procedure confirmed with present staff. Received instructions for my participation in the procedure from the performing physician.  

## 2019-06-17 NOTE — Progress Notes (Signed)
Pt's states no medical or surgical changes since previsit or office visit. 

## 2019-06-17 NOTE — Patient Instructions (Signed)
NO ASPIRIN, ASPIRIN CONTAINING PRODUCTS (BC OR GOODY POWDERS) OR NSAIDS (IBUPROFEN, ADVIL, ALEVE,Naproxen, AND MOTRIN) FOR 2 weeks after polyp removal. TYLENOL IS OK TO TAKE   Await biopsy results and pathology results of polyps removed   Handouts on polyps and hemorrhoids given to you today   YOU HAD AN ENDOSCOPIC PROCEDURE TODAY AT [facility]:   Refer to the procedure report that was given to you for any specific questions about what was found during the examination.  If the procedure report does not answer your questions, please call your gastroenterologist to clarify.  If you requested that your care partner not be given the details of your procedure findings, then the procedure report has been included in a sealed envelope for you to review at your convenience later.  YOU SHOULD EXPECT: Some feelings of bloating in the abdomen. Passage of more gas than usual.  Walking can help get rid of the air that was put into your GI tract during the procedure and reduce the bloating. If you had a lower endoscopy (such as a colonoscopy or flexible sigmoidoscopy) you may notice spotting of blood in your stool or on the toilet paper. If you underwent a bowel prep for your procedure, you may not have a normal bowel movement for a few days.  Please Note:  You might notice some irritation and congestion in your nose or some drainage.  This is from the oxygen used during your procedure.  There is no need for concern and it should clear up in a day or so.  SYMPTOMS TO REPORT IMMEDIATELY:   Following lower endoscopy (colonoscopy or flexible sigmoidoscopy):  Excessive amounts of blood in the stool  Significant tenderness or worsening of abdominal pains  Swelling of the abdomen that is new, acute  Fever of 100F or higher    For urgent or emergent issues, a gastroenterologist can be reached at any hour by calling [phone].   DIET:  We do recommend a small meal at first, but  then you may proceed to your regular diet.  Drink plenty of fluids but you should avoid alcoholic beverages for 24 hours.  ACTIVITY:  You should plan to take it easy for the rest of today and you should NOT DRIVE or use heavy machinery until tomorrow (because of the sedation medicines used during the test).    FOLLOW UP: Our staff will call the number listed on your records 48-72 hours following your procedure to check on you and address any questions or concerns that you may have regarding the information given to you following your procedure. If we do not reach you, we will leave a message.  We will attempt to reach you two times.  During this call, we will ask if you have developed any symptoms of COVID 19. If you develop any symptoms (ie: fever, flu-like symptoms, shortness of breath, cough etc.) before then, please call [phone].  If you test positive for Covid 19 in the 2 weeks post procedure, please call and report this information to [place].    If any biopsies were taken you will be contacted by phone or by letter within the next 1-3 weeks.  Please call us at [phone] if you have not heard about the biopsies in 3 weeks.    SIGNATURES/CONFIDENTIALITY: You and/or your care partner have signed paperwork which will be entered into your electronic medical record.  These signatures attest to the fact that that the information above on  your After Visit Summary has been reviewed and is understood.  Full responsibility of the confidentiality of this discharge information lies with you and/or your care-partner.

## 2019-06-19 ENCOUNTER — Telehealth: Payer: Self-pay

## 2019-06-19 NOTE — Telephone Encounter (Signed)
Follow up attempted.  NALM

## 2019-06-19 NOTE — Telephone Encounter (Signed)
  Follow up Call-  Call back number 06/17/2019 03/01/2017  Post procedure Call Back phone  # [phone]  Permission to leave phone message Yes Yes  Some recent data might be hidden     Patient questions:  Do you have a fever, pain , or abdominal swelling? No. Pain Score  0 *  Have you tolerated food without any problems? Yes.    Have you been able to return to your normal activities? Yes.    Do you have any questions about your discharge instructions: Diet   No. Medications  No. Follow up visit  No.  Do you have questions or concerns about your Care? No.  Actions: * If pain score is 4 or above: No action needed, pain <4.  1. Have you developed a fever since your procedure? no  2.   Have you had an respiratory symptoms (SOB or cough) since your procedure? no  3.   Have you tested positive for COVID 19 since your procedure no  4.   Have you had any family members/close contacts diagnosed with the COVID 19 since your procedure?  no   If yes to any of these questions please route to Joylene John, RN and Alphonsa Gin, Therapist, sports.

## 2019-06-24 ENCOUNTER — Encounter: Payer: Self-pay | Admitting: Gastroenterology

## 2019-09-26 ENCOUNTER — Other Ambulatory Visit: Payer: Self-pay | Admitting: *Deleted

## 2019-09-26 MED ORDER — ROSUVASTATIN CALCIUM 20 MG PO TABS: 20.0000 mg | ORAL_TABLET | Freq: Every day | ORAL | 1 refills | Status: DC

## 2019-10-22 ENCOUNTER — Encounter: Payer: Managed Care, Other (non HMO) | Admitting: Family Medicine

## 2019-10-27 ENCOUNTER — Ambulatory Visit: Payer: Managed Care, Other (non HMO) | Attending: Internal Medicine

## 2019-10-27 DIAGNOSIS — Z20822 Contact with and (suspected) exposure to covid-19: Secondary | ICD-10-CM

## 2019-10-28 LAB — NOVEL CORONAVIRUS, NAA: SARS-CoV-2, NAA: NOT DETECTED

## 2019-11-03 ENCOUNTER — Other Ambulatory Visit: Payer: Self-pay

## 2019-11-04 ENCOUNTER — Encounter: Payer: Self-pay | Admitting: Family Medicine

## 2019-11-04 ENCOUNTER — Ambulatory Visit (INDEPENDENT_AMBULATORY_CARE_PROVIDER_SITE_OTHER): Payer: No Typology Code available for payment source | Admitting: Family Medicine

## 2019-11-04 VITALS — BP 124/66 | HR 74 | Temp 98.0°F | Ht 73.0 in | Wt 314.2 lb

## 2019-11-04 DIAGNOSIS — Z13220 Encounter for screening for lipoid disorders: Secondary | ICD-10-CM | POA: Diagnosis not present

## 2019-11-04 DIAGNOSIS — I10 Essential (primary) hypertension: Secondary | ICD-10-CM

## 2019-11-04 DIAGNOSIS — Z125 Encounter for screening for malignant neoplasm of prostate: Secondary | ICD-10-CM

## 2019-11-04 DIAGNOSIS — E785 Hyperlipidemia, unspecified: Secondary | ICD-10-CM

## 2019-11-04 DIAGNOSIS — F3342 Major depressive disorder, recurrent, in full remission: Secondary | ICD-10-CM

## 2019-11-04 DIAGNOSIS — K5130 Ulcerative (chronic) rectosigmoiditis without complications: Secondary | ICD-10-CM

## 2019-11-04 DIAGNOSIS — G4700 Insomnia, unspecified: Secondary | ICD-10-CM

## 2019-11-04 DIAGNOSIS — Z131 Encounter for screening for diabetes mellitus: Secondary | ICD-10-CM

## 2019-11-04 DIAGNOSIS — Z6841 Body Mass Index (BMI) 40.0 and over, adult: Secondary | ICD-10-CM

## 2019-11-04 DIAGNOSIS — R739 Hyperglycemia, unspecified: Secondary | ICD-10-CM

## 2019-11-04 DIAGNOSIS — L649 Androgenic alopecia, unspecified: Secondary | ICD-10-CM

## 2019-11-04 DIAGNOSIS — Z0001 Encounter for general adult medical examination with abnormal findings: Secondary | ICD-10-CM

## 2019-11-04 LAB — LDL CHOLESTEROL, DIRECT: Direct LDL: 113 mg/dL

## 2019-11-04 LAB — LIPID PANEL
Cholesterol: 190 mg/dL (ref 0–200)
HDL: 50.9 mg/dL (ref >39.00)
NonHDL: 138.83
Total CHOL/HDL Ratio: 4
Triglycerides: 325 mg/dL — ABNORMAL HIGH (ref 0.0–149.0)
VLDL: 65 mg/dL — ABNORMAL HIGH (ref 0.0–40.0)

## 2019-11-04 LAB — HEMOGLOBIN A1C: Hgb A1c MFr Bld: 5.5 % (ref 4.6–6.5)

## 2019-11-04 LAB — PSA: PSA: 1.71 ng/mL (ref 0.10–4.00)

## 2019-11-04 NOTE — Assessment & Plan Note (Signed)
At goal.  Continue amlodipine-benazepril 10-40 daily.

## 2019-11-04 NOTE — Assessment & Plan Note (Signed)
Stable.  Finasteride refilled.

## 2019-11-04 NOTE — Assessment & Plan Note (Signed)
Continue Crestor 20 mg daily.  Check lipid panel today.

## 2019-11-04 NOTE — Patient Instructions (Signed)
It was very nice to see you today!  We will check blood work today.  No medication changes today.  Come back in 1 year for your next physical, or sooner if needed.  Take care, Dr [name]  Please try these tips to maintain a healthy lifestyle:   Eat at least 3 REAL meals and 1-2 snacks per day.  Aim for no more than 5 hours between eating.  If you eat breakfast, please do so within one hour of getting up.    Each meal should contain half fruits/vegetables, one quarter protein, and one quarter carbs (no bigger than a computer mouse)   Cut down on sweet beverages. This includes juice, soda, and sweet tea.     Drink at least 1 glass of water with each meal and aim for at least 8 glasses per day   Exercise at least 150 minutes every week.    Preventive Care 96-60 Years Old, Male Preventive care refers to lifestyle choices and visits with your health care provider that can promote health and wellness. This includes:  A yearly physical exam. This is also called an annual well check.  Regular dental and eye exams.  Immunizations.  Screening for certain conditions.  Healthy lifestyle choices, such as eating a healthy diet, getting regular exercise, not using drugs or products that contain nicotine and tobacco, and limiting alcohol use. What can I expect for my preventive care visit? Physical exam Your health care provider will check:  Height and weight. These may be used to calculate body mass index (BMI), which is a measurement that tells if you are at a healthy weight.  Heart rate and blood pressure.  Your skin for abnormal spots. Counseling Your health care provider may ask you questions about:  Alcohol, tobacco, and drug use.  Emotional well-being.  Home and relationship well-being.  Sexual activity.  Eating habits.  Work and work Statistician. What immunizations do I need?  Influenza (flu) vaccine  This is recommended every year. Tetanus, diphtheria,  and pertussis (Tdap) vaccine  You may need a Td booster every 10 years. Varicella (chickenpox) vaccine  You may need this vaccine if you have not already been vaccinated. Zoster (shingles) vaccine  You may need this after age 76. Measles, mumps, and rubella (MMR) vaccine  You may need at least one dose of MMR if you were born in 1957 or later. You may also need a second dose. Pneumococcal conjugate (PCV13) vaccine  You may need this if you have certain conditions and were not previously vaccinated. Pneumococcal polysaccharide (PPSV23) vaccine  You may need one or two doses if you smoke cigarettes or if you have certain conditions. Meningococcal conjugate (MenACWY) vaccine  You may need this if you have certain conditions. Hepatitis A vaccine  You may need this if you have certain conditions or if you travel or work in places where you may be exposed to hepatitis A. Hepatitis B vaccine  You may need this if you have certain conditions or if you travel or work in places where you may be exposed to hepatitis B. Haemophilus influenzae type b (Hib) vaccine  You may need this if you have certain risk factors. Human papillomavirus (HPV) vaccine  If recommended by your health care provider, you may need three doses over 6 months. You may receive vaccines as individual doses or as more than one vaccine together in one shot (combination vaccines). Talk with your health care provider about the risks and benefits of combination  vaccines. What tests do I need? Blood tests  Lipid and cholesterol levels. These may be checked every 5 years, or more frequently if you are over 79 years old.  Hepatitis C test.  Hepatitis B test. Screening  Lung cancer screening. You may have this screening every year starting at age 57 if you have a 30-pack-year history of smoking and currently smoke or have quit within the past 15 years.  Prostate cancer screening. Recommendations will vary depending on  your family history and other risks.  Colorectal cancer screening. All adults should have this screening starting at age 41 and continuing until age 32. Your health care provider may recommend screening at age 57 if you are at increased risk. You will have tests every 1-10 years, depending on your results and the type of screening test.  Diabetes screening. This is done by checking your blood sugar (glucose) after you have not eaten for a while (fasting). You may have this done every 1-3 years.  Sexually transmitted disease (STD) testing. Follow these instructions at home: Eating and drinking  Eat a diet that includes fresh fruits and vegetables, whole grains, lean protein, and low-fat dairy products.  Take vitamin and mineral supplements as recommended by your health care provider.  Do not drink alcohol if your health care provider tells you not to drink.  If you drink alcohol: ? Limit how much you have to 0-2 drinks a day. ? Be aware of how much alcohol is in your drink. In the [place] one drink equals one 12 oz bottle of beer (355 mL), one 5 oz glass of wine (148 mL), or one 1 oz glass of hard liquor (44 mL). Lifestyle  Take daily care of your teeth and gums.  Stay active. Exercise for at least 30 minutes on 5 or more days each week.  Do not use any products that contain nicotine or tobacco, such as cigarettes, e-cigarettes, and chewing tobacco. If you need help quitting, ask your health care provider.  If you are sexually active, practice safe sex. Use a condom or other form of protection to prevent STIs (sexually transmitted infections).  Talk with your health care provider about taking a low-dose aspirin every day starting at age 87. What's next?  Go to your health care provider once a year for a well check visit.  Ask your health care provider how often you should have your eyes and teeth checked.  Stay up to date on all vaccines. This information is not intended to replace  advice given to you by your health care provider. Make sure you discuss any questions you have with your health care provider. Document Revised: 08/15/2018 Document Reviewed: 08/15/2018 Elsevier Patient Education  2020 [employer].

## 2019-11-04 NOTE — Assessment & Plan Note (Signed)
Stable.  He would like to continue Lexapro 10 mg daily.  He is tolerating well without side effects.

## 2019-11-04 NOTE — Assessment & Plan Note (Signed)
Stable.  Continue management per GI.

## 2019-11-04 NOTE — Progress Notes (Signed)
Chief Complaint:  Richard [name] is a 64 y.o. male who presents today for his annual comprehensive physical exam.    Assessment/Plan:  Chronic Problems Addressed Today: Androgenic alopecia Stable.  Finasteride refilled.  Recurrent major depressive disorder, in full remission ([place]) Stable.  He would like to continue Lexapro 10 mg daily.  He is tolerating well without side effects.  Ulcerative colitis ([place]) Stable.  Continue management per GI.  Essential hypertension At goal.  Continue amlodipine-benazepril 10-40 daily.  Hyperlipemia Continue Crestor 20 mg daily.  Check lipid panel today.  Insomnia Continue trazodone 25 to 50 mg nightly as needed.   Body mass index is 41.45 kg/m. / Obese BMI Metric Follow Up - 11/04/19 1401      BMI Metric Follow Up-Please document annually   BMI Metric Follow Up  Education provided        Preventative Healthcare: Check lipid panel, A1c, PSA.  Up-to-date on colon cancer screening.  Patient Counseling(The following topics were reviewed and/or handout was given):  -Nutrition: Stressed importance of moderation in sodium/caffeine intake, saturated fat and cholesterol, caloric balance, sufficient intake of fresh fruits, vegetables, and fiber.  -Stressed the importance of regular exercise.   -Substance Abuse: Discussed cessation/primary prevention of tobacco, alcohol, or other drug use; driving or other dangerous activities under the influence; availability of treatment for abuse.   -Injury prevention: Discussed safety belts, safety helmets, smoke detector, smoking near bedding or upholstery.   -Sexuality: Discussed sexually transmitted diseases, partner selection, use of condoms, avoidance of unintended pregnancy and contraceptive alternatives.   -Dental health: Discussed importance of regular tooth brushing, flossing, and dental visits.  -Health maintenance and immunizations reviewed. Please refer to Health maintenance  section.  Return to care in 1 year for next preventative visit.     Subjective:  HPI:  He has no acute complaints today.   Lifestyle Diet: Balanced. Plenty of fruits and vegetables.  Exercise: Trying to get back into going in to the gym.   Depression screen [facility] 2/9 04/24/2019  Decreased Interest 0  Down, Depressed, Hopeless 0  PHQ - 2 Score 0  Altered sleeping 0  Tired, decreased energy 0  Change in appetite 0  Feeling bad or failure about yourself  0  Trouble concentrating 0  Moving slowly or fidgety/restless 0  Suicidal thoughts 0  PHQ-9 Score 0   There are no preventive care reminders to display for this patient.   ROS: Per HPI, otherwise a complete review of systems was negative.   PMH:  The following were reviewed and entered/updated in epic: Past Medical History:  Diagnosis Date  . ALLERGIC RHINITIS CAUSE UNSPECIFIED 05/21/2009  . Allergy   . BACK PAIN, LUMBAR, WITH RADICULOPATHY 03/18/2010  . COLITIS, ULCERATIVE 05/28/2007  . Glaucoma    2 yrs ago eye md told does NOT have glaucoma, pt denies this dx   . HYPERLIPIDEMIA 05/28/2007  . HYPERTENSION 05/28/2007  . HYPOGONADISM, MALE 07/24/2008  . Internal hemorrhoids   . 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 Recurrent major depressive disorder, in full remission ([place]) 10/07/2015   with GAD   . SLEEP APNEA 12/18/2008   wears CPAP  . Sleep apnea    wears cpap nightly  . Tubular adenoma of colon    Patient Active Problem List   Diagnosis Date Noted  . Insomnia 04/24/2019  . Hx of adenomatous colonic polyps 10/21/2015  . Recurrent major depressive disorder, in full remission ([place]) 10/07/2015  . 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  .  Sleep apnea 12/18/2008  . Hyperlipemia 05/28/2007  . Essential hypertension 05/28/2007  . Ulcerative colitis ([place]) 05/28/2007   Past Surgical History:  Procedure Laterality Date  . COLONOSCOPY  02/2017   hx polyps& IC  . NASAL SINUS SURGERY    . POLYPECTOMY     colon polyps  . WISDOM  TOOTH EXTRACTION      Family History  Problem Relation Age of Onset  . Hyperlipidemia Father   . Hypertension Father   . Heart disease Father   . Breast cancer Mother        <50  . Ulcerative colitis Cousin        2 cousins paternal side  . Colon polyps Cousin   . Colon cancer Neg Hx   . Esophageal cancer Neg Hx   . Stomach cancer Neg Hx   . Rectal cancer Neg Hx   . Prostate cancer Neg Hx    Medications- reviewed and updated Current Outpatient Medications  Medication Sig Dispense Refill  . amLODipine-benazepril (LOTREL) 10-40 MG capsule TAKE 1 CAPSULE BY MOUTH EVERY DAY 90 capsule 1  . escitalopram (LEXAPRO) 10 MG tablet Take 1 tablet (10 mg total) by mouth daily. 90 tablet 1  . finasteride (PROPECIA) 1 MG tablet Take 1 tablet (1 mg total) by mouth daily. 90 tablet 3  . Multiple Vitamins-Iron (MULTIVITAMIN/IRON) TABS Take 1 tablet by mouth daily.      Marland Kitchen NASONEX 50 MCG/ACT nasal spray PLACE 2 SQUIRTS IN EACH NOSTRIL DAILY (Patient taking differently: PLACE 2 SQUIRTS IN EACH NOSTRIL AS NEEDED) 17 g 11  . rosuvastatin (CRESTOR) 20 MG tablet Take 1 tablet (20 mg total) by mouth daily. 90 tablet 1  . traZODone (DESYREL) 50 MG tablet TAKE 0.5-1 TABLETS (25-50 MG TOTAL) BY MOUTH AT BEDTIME AS NEEDED FOR SLEEP. 90 tablet 2  . sulfaSALAzine (AZULFIDINE) 500 MG EC tablet TAKE 3 TABLETS BY MOUTH 2 TIMES DAILY. 540 tablet 3   No current facility-administered medications for this visit.    Allergies-reviewed and updated No Known Allergies  Social History   Socioeconomic History  . Marital status: Single    Spouse name: Not on file  . Number of children: 0  . Years of education: Not on file  . Highest education level: Not on file  Occupational History  . Occupation: Architectural technologist: [employer]  Tobacco Use  . Smoking status: Former Smoker    Years: 20.00    Types: Cigarettes    Quit date: 10/21/1999    Years since quitting: 20.0  . Smokeless tobacco: Never Used   Substance and Sexual Activity  . Alcohol use: Yes    Alcohol/week: 5.0 - 7.0 standard drinks    Types: 5 - 7 Standard drinks or equivalent per week    Comment: 1 drink per day at most  . Drug use: No  . Sexual activity: Not on file  Other Topics Concern  . 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 Difficulty of Paying Living Expenses: Not on file  Food Insecurity:   . Worried About Charity fundraiser in the Last Year: Not on file  . Ran Out of Food  in the Last Year: Not on file  Transportation Needs:   . Lack of Transportation (Medical): Not on file  . Lack of Transportation (Non-Medical): Not on file  Physical Activity:   . Days of Exercise per Week: Not on file  . Minutes of Exercise per Session: Not on file  Stress:   . Feeling of Stress : Not on file  Social Connections:   . Frequency of Communication with Friends and Family: Not on file  . Frequency of Social Gatherings with Friends and Family: Not on file  . Attends Religious Services: Not on file  . Active Member of Clubs or Organizations: Not on file  . Attends Archivist Meetings: Not on file  . Marital Status: Not on file        Objective:  Physical Exam: BP 124/66   Pulse 74   Temp 98 F (36.7 C)   Ht 6' 1"  (1.854 m)   Wt (!) 314 lb 3.2 oz (142.5 kg)   SpO2 95%   BMI 41.45 kg/m   Body mass index is 41.45 kg/m. Wt Readings from Last 3 Encounters:  11/04/19 (!) 314 lb 3.2 oz (142.5 kg)  06/17/19 (!) 315 lb (142.9 kg)  06/03/19 (!) 315 lb (142.9 kg)   Gen: NAD, resting comfortably HEENT: TMs normal bilaterally. OP clear. No thyromegaly noted.  CV: RRR with no murmurs appreciated Pulm: NWOB, CTAB with no crackles, wheezes, or rhonchi GI: Normal bowel sounds present. Soft, Nontender,  Nondistended. MSK: no edema, cyanosis, or clubbing noted Skin: warm, dry Neuro: CN2-12 grossly intact. Strength 5/5 in upper and lower extremities. Reflexes symmetric and intact bilaterally.  Psych: Normal affect and thought content     Deren Degrazia M. [name], MD 11/04/2019 2:01 PM

## 2019-11-04 NOTE — Assessment & Plan Note (Signed)
Continue trazodone 25 to 50 mg nightly as needed.

## 2019-11-05 NOTE — Progress Notes (Signed)
Please inform patient of the following:  Labs are all stable. Do not need to make any changes to his treatment plan at this time. Would like for him to keep up the good work and we can recheck in a year or so.  [name]. [name], MD 11/05/2019 8:12 AM

## 2019-12-26 ENCOUNTER — Other Ambulatory Visit: Payer: Self-pay | Admitting: Family Medicine

## 2020-01-19 ENCOUNTER — Other Ambulatory Visit: Payer: Self-pay | Admitting: Family Medicine

## 2020-02-03 ENCOUNTER — Telehealth: Payer: Self-pay | Admitting: Family Medicine

## 2020-02-03 ENCOUNTER — Other Ambulatory Visit: Payer: Self-pay

## 2020-02-03 NOTE — Telephone Encounter (Signed)
Nurse Assessment Nurse: Renne Crigler RN, Sherlie Ban Date/Time (Eastern Time): 01/31/2020 1:45:48 PM Confirm and document reason for call. If symptomatic, describe symptoms. ---Caller states has ran out of his sulfasalazine 550m, 3 tablets BID for a total of 6 per day. [facility] because [facility] don't [facility] the extended release. [facility] wants to know if the nonextended release will be ok, perhaps a 1 week supply until the extended release is available. Denies symptoms or flair-ups at this moment. States if he goes a couple days with out it, he will start to have symptoms. Has the patient had close contact with a person known or suspected to have the novel coronavirus illness OR traveled / lives in area with major community spread (including international travel) in the last 14 days from the onset of symptoms? * If Asymptomatic, screen for exposure and travel within the last 14 days. ---No Does the patient have any new or worsening symptoms? ---No Guidelines Guideline Title Affirmed Question Affirmed Notes Nurse Date/Time (Eastern Time) Disp. Time (Eilene GhaziTime) Disposition Final User 01/31/2020 1:56:50 PM Called On-Call Provider HMosquero RN, KSherlie Ban5/29/2021 1:57:32 PM Pharmacy Call HRenne Crigler RN, KSherlie BanReason: [facility] [address]: [phone] NOTE: All timestamps contained within this report are represented as [place] Time. CONFIDENTIALTY NOTICE: This fax transmission is intended only for the addressee. It contains information that is legally privileged, confidential or otherwise protected from use or disclosure. If you are not the intended recipient, you are strictly prohibited from reviewing, disclosing, copying using or disseminating any of this information or taking any action in reliance on or regarding this information. If you have received this fax in error, please notify u[place]aimmediately by telephone so that we can arrange for its return to [place] Phone: [phone]  Toll-Free: [phone] Fax: [phone]: 2 of 3 Call Id: [id number]/29/2021 2:05:13 PM Clinical Call Yes HRenne Crigler RN, KSherlie BanVerbal Orders/Maintenance Medications Medication Refill Route Dosage Regime Duration Admin Instructions User Name sulfasalazine Oral 5054m7 Days Take 2 tablets TID, #42 with zero refills. HeRenne CriglerRN, KaHouston [facility] DateTime Result/Outcome Message Type Notes CoOwens Loffler MD [phone]/29/2021 1:56:50 PM Called On Call Provider - Reached Doctor Paged CoOwens Loffler MD 01/31/2020 1:57:03 PM Spoke with On Call - General Message Result See medication tab for VO

## 2020-02-03 NOTE — Telephone Encounter (Signed)
Ok to send in non-extended release.  [name]. [name], MD 02/03/2020 3:52 PM

## 2020-02-03 NOTE — Telephone Encounter (Signed)
3 tabs twice a day?

## 2020-02-04 ENCOUNTER — Other Ambulatory Visit: Payer: Self-pay

## 2020-02-04 MED ORDER — SULFASALAZINE 500 MG PO TABS: 500.0000 mg | ORAL_TABLET | Freq: Two times a day (BID) | ORAL | 3 refills | Status: DC

## 2020-02-04 NOTE — Telephone Encounter (Signed)
Script sent to pharmacy.

## 2020-02-04 NOTE — Telephone Encounter (Signed)
Yes. The dosage should remain the same.  [name]. [name], MD 02/04/2020 7:57 AM

## 2020-02-05 ENCOUNTER — Other Ambulatory Visit: Payer: Self-pay

## 2020-04-01 ENCOUNTER — Other Ambulatory Visit: Payer: Self-pay | Admitting: Family Medicine

## 2020-06-28 ENCOUNTER — Other Ambulatory Visit: Payer: Self-pay | Admitting: Family Medicine

## 2020-07-25 ENCOUNTER — Other Ambulatory Visit: Payer: Self-pay | Admitting: Family Medicine

## 2020-08-19 ENCOUNTER — Other Ambulatory Visit: Payer: Self-pay | Admitting: Family Medicine

## 2020-09-16 ENCOUNTER — Ambulatory Visit (INDEPENDENT_AMBULATORY_CARE_PROVIDER_SITE_OTHER): Payer: No Typology Code available for payment source | Admitting: Physician Assistant

## 2020-09-16 ENCOUNTER — Other Ambulatory Visit: Payer: Self-pay

## 2020-09-16 ENCOUNTER — Encounter: Payer: Self-pay | Admitting: Physician Assistant

## 2020-09-16 DIAGNOSIS — L738 Other specified follicular disorders: Secondary | ICD-10-CM | POA: Diagnosis not present

## 2020-09-16 DIAGNOSIS — L821 Other seborrheic keratosis: Secondary | ICD-10-CM

## 2020-09-16 DIAGNOSIS — Z1283 Encounter for screening for malignant neoplasm of skin: Secondary | ICD-10-CM

## 2020-09-17 ENCOUNTER — Encounter: Payer: Self-pay | Admitting: Physician Assistant

## 2020-09-17 NOTE — Progress Notes (Signed)
   Follow-Up Visit   Subjective  Richard [name] is a 65 y.o. male who presents for the following: Annual Exam (Isk right hand).   The following portions of the chart were reviewed this encounter and updated as appropriate:      Objective  Well appearing patient in no apparent distress; mood and affect are within normal limits.  All skin waist up examined.  Objective  waist up: Full body skin examination- no atypical moles or non mole skin cancer  Objective  Right Dorsal Hand: Multiple Stuck-on, waxy, tan-brown papules and plaques. --Discussed benign etiology and prognosis.   Objective  face and nose: Small yellow papules with a central dell.    Assessment & Plan  Encounter for screening for malignant neoplasm of skin waist up  Yearly skin check  Seborrheic keratosis Right Dorsal Hand  observe  Sebaceous gland hyperplasia of face face and nose  observe    I, London Nonaka, PA-C, have reviewed all documentation's for this visit.  The documentation on 09/17/20 for the exam, diagnosis, procedures and orders are all accurate and complete.

## 2020-10-27 ENCOUNTER — Other Ambulatory Visit: Payer: Self-pay | Admitting: Family Medicine

## 2020-11-04 ENCOUNTER — Encounter: Payer: No Typology Code available for payment source | Admitting: Family Medicine

## 2020-11-18 ENCOUNTER — Ambulatory Visit (INDEPENDENT_AMBULATORY_CARE_PROVIDER_SITE_OTHER): Payer: 59 | Admitting: Family Medicine

## 2020-11-18 ENCOUNTER — Encounter: Payer: Self-pay | Admitting: Family Medicine

## 2020-11-18 VITALS — BP 135/84 | HR 87 | Temp 98.0°F | Ht 73.0 in | Wt 293.0 lb

## 2020-11-18 DIAGNOSIS — L649 Androgenic alopecia, unspecified: Secondary | ICD-10-CM

## 2020-11-18 DIAGNOSIS — E669 Obesity, unspecified: Secondary | ICD-10-CM

## 2020-11-18 DIAGNOSIS — Z0001 Encounter for general adult medical examination with abnormal findings: Secondary | ICD-10-CM | POA: Diagnosis not present

## 2020-11-18 DIAGNOSIS — I10 Essential (primary) hypertension: Secondary | ICD-10-CM | POA: Diagnosis not present

## 2020-11-18 DIAGNOSIS — Z125 Encounter for screening for malignant neoplasm of prostate: Secondary | ICD-10-CM | POA: Diagnosis not present

## 2020-11-18 DIAGNOSIS — K5130 Ulcerative (chronic) rectosigmoiditis without complications: Secondary | ICD-10-CM | POA: Diagnosis not present

## 2020-11-18 DIAGNOSIS — F3342 Major depressive disorder, recurrent, in full remission: Secondary | ICD-10-CM

## 2020-11-18 DIAGNOSIS — E785 Hyperlipidemia, unspecified: Secondary | ICD-10-CM | POA: Diagnosis not present

## 2020-11-18 DIAGNOSIS — G4700 Insomnia, unspecified: Secondary | ICD-10-CM

## 2020-11-18 DIAGNOSIS — R739 Hyperglycemia, unspecified: Secondary | ICD-10-CM | POA: Diagnosis not present

## 2020-11-18 DIAGNOSIS — Z6838 Body mass index (BMI) 38.0-38.9, adult: Secondary | ICD-10-CM

## 2020-11-18 LAB — CBC
HCT: 50.3 % (ref 39.0–52.0)
Hemoglobin: 17.3 g/dL — ABNORMAL HIGH (ref 13.0–17.0)
MCHC: 34.4 g/dL (ref 30.0–36.0)
MCV: 92.5 fl (ref 78.0–100.0)
Platelets: 249 10*3/uL (ref 150.0–400.0)
RBC: 5.44 Mil/uL (ref 4.22–5.81)
RDW: 13.3 % (ref 11.5–15.5)
WBC: 10.5 10*3/uL (ref 4.0–10.5)

## 2020-11-18 LAB — COMPREHENSIVE METABOLIC PANEL
ALT: 31 U/L (ref 0–53)
AST: 36 U/L (ref 0–37)
Albumin: 4.6 g/dL (ref 3.5–5.2)
Alkaline Phosphatase: 56 U/L (ref 39–117)
BUN: 20 mg/dL (ref 6–23)
CO2: 23 mEq/L (ref 19–32)
Calcium: 9.6 mg/dL (ref 8.4–10.5)
Chloride: 99 mEq/L (ref 96–112)
Creatinine, Ser: 0.81 mg/dL (ref 0.40–1.50)
GFR: 94.24 mL/min (ref >60.00)
Glucose, Bld: 92 mg/dL (ref 70–99)
Potassium: 4.6 mEq/L (ref 3.5–5.1)
Sodium: 135 mEq/L (ref 135–145)
Total Bilirubin: 0.6 mg/dL (ref 0.2–1.2)
Total Protein: 7.5 g/dL (ref 6.0–8.3)

## 2020-11-18 LAB — PSA: PSA: 3.46 ng/mL (ref 0.10–4.00)

## 2020-11-18 LAB — LIPID PANEL
Cholesterol: 182 mg/dL (ref 0–200)
HDL: 47.5 mg/dL (ref >39.00)
LDL Cholesterol: 104 mg/dL — ABNORMAL HIGH (ref 0–99)
NonHDL: 134.62
Total CHOL/HDL Ratio: 4
Triglycerides: 151 mg/dL — ABNORMAL HIGH (ref 0.0–149.0)
VLDL: 30.2 mg/dL (ref 0.0–40.0)

## 2020-11-18 LAB — HEMOGLOBIN A1C: Hgb A1c MFr Bld: 5.3 % (ref 4.6–6.5)

## 2020-11-18 LAB — TSH: TSH: 1.12 u[IU]/mL (ref 0.35–4.50)

## 2020-11-18 MED ORDER — AMOXICILLIN 875 MG PO TABS: 875.0000 mg | ORAL_TABLET | Freq: Two times a day (BID) | ORAL | 0 refills | Status: DC

## 2020-11-18 NOTE — Assessment & Plan Note (Signed)
Stable.  Continue finasteride 1 mg daily. 

## 2020-11-18 NOTE — Assessment & Plan Note (Signed)
Stable on Lexapro 10 mg daily.  We will continue this.

## 2020-11-18 NOTE — Assessment & Plan Note (Signed)
Stable.  He will be following up with GI soon.  He needs a colonoscopy.

## 2020-11-18 NOTE — Progress Notes (Signed)
Chief Complaint:  Richard [name] is a 65 y.o. male who presents today for his annual comprehensive physical exam.    Assessment/Plan:  New/Acute Problems: Otitis media We will send in amoxicillin.  Chronic Problems Addressed Today: Androgenic alopecia Stable.  Continue finasteride 1 mg daily.  Recurrent major depressive disorder, in full remission ([facility]) Stable on Lexapro 10 mg daily.  We will continue this.  Ulcerative colitis ([place]) Stable.  He will be following up with GI soon.  He needs a colonoscopy.  Essential hypertension At goal.  Continue amlodipine-benazepril 10-40 daily.  Hyperlipemia Stable.  Check lipids today.  Continue Crestor 20 mg daily.   Body mass index is 38.66 kg/m. / Obese  BMI Metric Follow Up - 11/18/20 1202      BMI Metric Follow Up-Please document annually   BMI Metric Follow Up Education provided            Preventative Healthcare: Check labs today.  Due for colon cancer screening he will follow-up with GI for this.  Patient Counseling(The following topics were reviewed and/or handout was given):  -Nutrition: Stressed importance of moderation in sodium/caffeine intake, saturated fat and cholesterol, caloric balance, sufficient intake of fresh fruits, vegetables, and fiber.  -Stressed the importance of regular exercise.   -Substance Abuse: Discussed cessation/primary prevention of tobacco, alcohol, or other drug use; driving or other dangerous activities under the influence; availability of treatment for abuse.   -Injury prevention: Discussed safety belts, safety helmets, smoke detector, smoking near bedding or upholstery.   -Sexuality: Discussed sexually transmitted diseases, partner selection, use of condoms, avoidance of unintended pregnancy and contraceptive alternatives.   -Dental health: Discussed importance of regular tooth brushing, flossing, and dental visits.  -Health maintenance and immunizations reviewed. Please refer to  Health maintenance section.  Return to care in 1 year for next preventative visit.     Subjective:  HPI:  He has had some issues with left ear pain for the last couple of weeks.  Has been dealing with a URI recently.  Is concerned he may have an ear infection.  Had symptoms similar few years ago that needed to be treated with amoxicillin.  Lifestyle Diet: Balanced. Plenty of fruits and vegetables.  Exercise: Limited.   Depression screen PHQ 2/9 11/18/2020  Decreased Interest 0  Down, Depressed, Hopeless 0  PHQ - 2 Score 0  Altered sleeping -  Tired, decreased energy -  Change in appetite -  Feeling bad or failure about yourself  -  Trouble concentrating -  Moving slowly or fidgety/restless -  Suicidal thoughts -  PHQ-9 Score -    Health Maintenance Due  Topic Date Due  . COVID-19 Vaccine (1) Never done     ROS: Per HPI, otherwise a complete review of systems was negative.   PMH:  The following were reviewed and entered/updated in epic: Past Medical History:  Diagnosis Date  . ALLERGIC RHINITIS CAUSE UNSPECIFIED 05/21/2009  . Allergy   . BACK PAIN, LUMBAR, WITH RADICULOPATHY 03/18/2010  . COLITIS, ULCERATIVE 05/28/2007  . Glaucoma    2 yrs ago eye md told does NOT have glaucoma, pt denies this dx   . HYPERLIPIDEMIA 05/28/2007  . HYPERTENSION 05/28/2007  . HYPOGONADISM, MALE 07/24/2008  . Internal hemorrhoids   . 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 Recurrent major depressive disorder, in full remission ([place]) 10/07/2015   with GAD   . SLEEP APNEA 12/18/2008   wears CPAP  . Sleep apnea    wears cpap  nightly  . Tubular adenoma of colon    Patient Active Problem List   Diagnosis Date Noted  . Insomnia 04/24/2019  . Hx of adenomatous colonic polyps 10/21/2015  . Recurrent major depressive disorder, in full remission ([place]) 10/07/2015  . 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  . Sleep apnea 12/18/2008  . Hyperlipemia 05/28/2007  . Essential hypertension 05/28/2007  .  Ulcerative colitis ([place]) 05/28/2007   Past Surgical History:  Procedure Laterality Date  . COLONOSCOPY  02/2017   hx polyps& IC  . NASAL SINUS SURGERY    . POLYPECTOMY     colon polyps  . WISDOM TOOTH EXTRACTION      Family History  Problem Relation Age of Onset  . Hyperlipidemia Father   . Hypertension Father   . Heart disease Father   . Breast cancer Mother        <50  . Ulcerative colitis Cousin        2 cousins paternal side  . Colon polyps Cousin   . Colon cancer Neg Hx   . Esophageal cancer Neg Hx   . Stomach cancer Neg Hx   . Rectal cancer Neg Hx   . Prostate cancer Neg Hx     Medications- reviewed and updated Current Outpatient Medications  Medication Sig Dispense Refill  . amLODipine-benazepril (LOTREL) 10-40 MG capsule TAKE 1 CAPSULE BY MOUTH EVERY DAY 90 capsule 1  . amoxicillin (AMOXIL) 875 MG tablet Take 1 tablet (875 mg total) by mouth 2 (two) times daily. 14 tablet 0  . escitalopram (LEXAPRO) 10 MG tablet TAKE 1 TABLET BY MOUTH EVERY DAY 90 tablet 1  . finasteride (PROPECIA) 1 MG tablet TAKE 1 TABLET BY MOUTH EVERY DAY 90 tablet 3  . Multiple Vitamins-Iron (MULTIVITAMIN/IRON) TABS Take 1 tablet by mouth daily.    Marland Kitchen NASONEX 50 MCG/ACT nasal spray PLACE 2 SQUIRTS IN EACH NOSTRIL DAILY 17 g 11  . rosuvastatin (CRESTOR) 20 MG tablet TAKE 1 TABLET BY MOUTH EVERY DAY 90 tablet 1  . sulfaSALAzine (AZULFIDINE) 500 MG tablet Take 1 tablet (500 mg total) by mouth 2 (two) times daily. Take 3 tablets by mouth 2 times a day 540 tablet 3  . traZODone (DESYREL) 50 MG tablet TAKE 0.5-1 TABLETS (25-50 MG TOTAL) BY MOUTH AT BEDTIME AS NEEDED FOR SLEEP. 90 tablet 2   No current facility-administered medications for this visit.    Allergies-reviewed and updated No Known Allergies  Social History   Socioeconomic History  . Marital status: Single    Spouse name: Not on file  . Number of children: 0  . Years of education: Not on file  . Highest education level: Not on  file  Occupational History  . Occupation: Architectural technologist: [employer]  Tobacco Use  . Smoking status: Former Smoker    Years: 20.00    Types: Cigarettes    Quit date: 10/21/1999    Years since quitting: 21.0  . Smokeless tobacco: Never Used  Vaping Use  . Vaping Use: Never used  Substance and Sexual Activity  . Alcohol use: Yes    Alcohol/week: 5.0 - 7.0 standard drinks    Types: 5 - 7 Standard drinks or equivalent per week    Comment: 1 drink per day at most  . Drug use: No  . Sexual activity: Not on file  Other Topics Concern  . 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 Wt Readings from Last 3 Encounters:  11/18/20 293 lb (132.9 kg)  11/04/19 (!) 314 lb 3.2 oz (142.5 kg)  06/17/19 (!) 315 lb (142.9 kg)   Gen: NAD, resting comfortably HEENT: Right TM erythematous and bulging.  Left TM clear.  OP clear. No thyromegaly noted.  CV: RRR with no murmurs appreciated Pulm: NWOB, CTAB with no crackles, wheezes, or rhonchi GI: Normal bowel sounds present. Soft, Nontender, Nondistended. MSK: no edema, cyanosis, or clubbing noted Skin: warm, dry Neuro: CN2-12 grossly intact. Strength 5/5 in upper and lower extremities. Reflexes symmetric and intact bilaterally.  Psych: Normal affect and thought content     Ryland Smoots M. [name], MD 11/18/2020 12:02 PM

## 2020-11-18 NOTE — Assessment & Plan Note (Signed)
Stable.  Check lipids today.  Continue Crestor 20 mg daily.

## 2020-11-18 NOTE — Assessment & Plan Note (Signed)
At goal.  Continue amlodipine-benazepril 10-40 daily.

## 2020-11-18 NOTE — Patient Instructions (Signed)
It was very nice to see you today!  We will check blood work.  I will send in an antibiotic for your ear.   Come back to see me in a year for your next physical, or sooner if needed.   Take care, Dr [name]  PLEASE NOTE:  If you had any lab tests please let us know if you have not heard back within a few days. You may see your results on mychart before we have a chance to review them but we will give you a call once they are reviewed by [place]. If we ordered any referrals today, please let us know if you have not heard from their office within the next week.   Please try these tips to maintain a healthy lifestyle:   Eat at least 3 REAL meals and 1-2 snacks per day.  Aim for no more than 5 hours between eating.  If you eat breakfast, please do so within one hour of getting up.    Each meal should contain half fruits/vegetables, one quarter protein, and one quarter carbs (no bigger than a computer mouse)   Cut down on sweet beverages. This includes juice, soda, and sweet tea.     Drink at least 1 glass of water with each meal and aim for at least 8 glasses per day   Exercise at least 150 minutes every week.    Preventive Care 28-58 Years Old, Male Preventive care refers to lifestyle choices and visits with your health care provider that can promote health and wellness. This includes:  A yearly physical exam. This is also called an annual wellness visit.  Regular dental and eye exams.  Immunizations.  Screening for certain conditions.  Healthy lifestyle choices, such as: ? Eating a healthy diet. ? Getting regular exercise. ? Not using drugs or products that contain nicotine and tobacco. ? Limiting alcohol use. What can I expect for my preventive care visit? Physical exam Your health care provider will check your:  Height and weight. These may be used to calculate your BMI (body mass index). BMI is a measurement that tells if you are at a healthy weight.  Heart rate and  blood pressure.  Body temperature.  Skin for abnormal spots. Counseling Your health care provider may ask you questions about your:  Past medical problems.  Family's medical history.  Alcohol, tobacco, and drug use.  Emotional well-being.  Home life and relationship well-being.  Sexual activity.  Diet, exercise, and sleep habits.  Work and work Statistician.  Access to firearms. What immunizations do I need? Vaccines are usually given at various ages, according to a schedule. Your health care provider will recommend vaccines for you based on your age, medical history, and lifestyle or other factors, such as travel or where you work.   What tests do I need? Blood tests  Lipid and cholesterol levels. These may be checked every 5 years, or more often if you are over 21 years old.  Hepatitis C test.  Hepatitis B test. Screening  Lung cancer screening. You may have this screening every year starting at age 31 if you have a 30-pack-year history of smoking and currently smoke or have quit within the past 15 years.  Prostate cancer screening. Recommendations will vary depending on your family history and other risks.  Genital exam to check for testicular cancer or hernias.  Colorectal cancer screening. ? All adults should have this screening starting at age 46 and continuing until age 3. ?  Your health care provider may recommend screening at age 57 if you are at increased risk. ? You will have tests every 1-10 years, depending on your results and the type of screening test.  Diabetes screening. ? This is done by checking your blood sugar (glucose) after you have not eaten for a while (fasting). ? You may have this done every 1-3 years.  STD (sexually transmitted disease) testing, if you are at risk. Follow these instructions at home: Eating and drinking  Eat a diet that includes fresh fruits and vegetables, whole grains, lean protein, and low-fat dairy  products.  Take vitamin and mineral supplements as recommended by your health care provider.  Do not drink alcohol if your health care provider tells you not to drink.  If you drink alcohol: ? Limit how much you have to 0-2 drinks a day. ? Be aware of how much alcohol is in your drink. In the [place] one drink equals one 12 oz bottle of beer (355 mL), one 5 oz glass of wine (148 mL), or one 1 oz glass of hard liquor (44 mL).   Lifestyle  Take daily care of your teeth and gums. Brush your teeth every morning and night with fluoride toothpaste. Floss one time each day.  Stay active. Exercise for at least 30 minutes 5 or more days each week.  Do not use any products that contain nicotine or tobacco, such as cigarettes, e-cigarettes, and chewing tobacco. If you need help quitting, ask your health care provider.  Do not use drugs.  If you are sexually active, practice safe sex. Use a condom or other form of protection to prevent STIs (sexually transmitted infections).  If told by your health care provider, take low-dose aspirin daily starting at age 84.  Find healthy ways to cope with stress, such as: ? Meditation, yoga, or listening to music. ? Journaling. ? Talking to a trusted person. ? Spending time with friends and family. Safety  Always wear your seat belt while driving or riding in a vehicle.  Do not drive: ? If you have been drinking alcohol. Do not ride with someone who has been drinking. ? When you are tired or distracted. ? While texting.  Wear a helmet and other protective equipment during sports activities.  If you have firearms in your house, make sure you follow all gun safety procedures. What's next?  Go to your health care provider once a year for an annual wellness visit.  Ask your health care provider how often you should have your eyes and teeth checked.  Stay up to date on all vaccines. This information is not intended to replace advice given to you by  your health care provider. Make sure you discuss any questions you have with your health care provider. Document Revised: 05/20/2019 Document Reviewed: 08/15/2018 Elsevier Patient Education  2021 [employer].

## 2020-11-19 NOTE — Progress Notes (Signed)
Please inform patient of the following:  Cholesterol is borderline elevated.  Labs are otherwise stable.  Do not need to make any changes to his treatment plan at this time.  Would like for him to keep up the good work with diet and exercise and we can recheck in a year.

## 2020-11-24 ENCOUNTER — Encounter: Payer: Self-pay | Admitting: Family Medicine

## 2020-11-25 ENCOUNTER — Other Ambulatory Visit: Payer: Self-pay

## 2020-11-25 ENCOUNTER — Ambulatory Visit (INDEPENDENT_AMBULATORY_CARE_PROVIDER_SITE_OTHER): Payer: 59 | Admitting: Family Medicine

## 2020-11-25 ENCOUNTER — Encounter: Payer: Self-pay | Admitting: Family Medicine

## 2020-11-25 VITALS — BP 129/79 | HR 78 | Temp 98.0°F | Ht 73.0 in | Wt 291.6 lb

## 2020-11-25 DIAGNOSIS — H6690 Otitis media, unspecified, unspecified ear: Secondary | ICD-10-CM

## 2020-11-25 MED ORDER — AMOXICILLIN-POT CLAVULANATE 875-125 MG PO TABS: 1.0000 | ORAL_TABLET | Freq: Two times a day (BID) | ORAL | 0 refills | Status: DC

## 2020-11-25 MED ORDER — PREDNISONE 50 MG PO TABS
ORAL_TABLET | ORAL | 0 refills | Status: DC
Start: 2020-11-25 — End: 2020-12-24

## 2020-11-25 NOTE — Patient Instructions (Addendum)
It was very nice to see you today!  Please start the prednisone and Augmentin.  Let me know if your symptoms are not improving by next week and I will place a referral for you to see an ear doctor.    Take care, Dr [name]  PLEASE NOTE:  If you had any lab tests please let us know if you have not heard back within a few days. You may see your results on mychart before we have a chance to review them but we will give you a call once they are reviewed by [place]. If we ordered any referrals today, please let us know if you have not heard from their office within the next week.   Please try these tips to maintain a healthy lifestyle:   Eat at least 3 REAL meals and 1-2 snacks per day.  Aim for no more than 5 hours between eating.  If you eat breakfast, please do so within one hour of getting up.    Each meal should contain half fruits/vegetables, one quarter protein, and one quarter carbs (no bigger than a computer mouse)   Cut down on sweet beverages. This includes juice, soda, and sweet tea.     Drink at least 1 glass of water with each meal and aim for at least 8 glasses per day   Exercise at least 150 minutes every week.

## 2020-11-25 NOTE — Progress Notes (Signed)
   Richard [name] is a 65 y.o. male who presents today for an office visit.  Assessment/Plan:  New/Acute Problems: Otitis media Did not respond to amoxicillin.  Will start prednisone and Augmentin.  If not improving will need referral to ENT.  Discussed reasons to return to care.    Subjective:  HPI:  Patient seen a week ago for annual physical.  Noted to have ear infection at a time.  We started amoxicillin.  And fortunately symptoms have persisted not improved.  Still feels a lot of ear fullness.  Worse in the left.       Objective:  Physical Exam: BP 129/79   Pulse 78   Temp 98 F (36.7 C) (Temporal)   Ht 6' 1"  (1.854 m)   Wt 291 lb 9.6 oz (132.3 kg)   SpO2 96%   BMI 38.47 kg/m   Gen: No acute distress, resting comfortably HEENT: Bilateral TMs with effusion, erythema, and slight bulge.      Richard [name]. Richard Pain, MD 11/25/2020 1:22 PM

## 2020-11-25 NOTE — Telephone Encounter (Signed)
Patient has an appointmet with PCP today 11/25/2020

## 2020-12-24 ENCOUNTER — Ambulatory Visit (INDEPENDENT_AMBULATORY_CARE_PROVIDER_SITE_OTHER): Payer: 59 | Admitting: Family Medicine

## 2020-12-24 ENCOUNTER — Encounter: Payer: Self-pay | Admitting: Family Medicine

## 2020-12-24 ENCOUNTER — Other Ambulatory Visit: Payer: Self-pay

## 2020-12-24 VITALS — BP 128/76 | HR 81 | Temp 98.3°F | Wt 292.0 lb

## 2020-12-24 DIAGNOSIS — B009 Herpesviral infection, unspecified: Secondary | ICD-10-CM

## 2020-12-24 MED ORDER — ACYCLOVIR 800 MG PO TABS: 800.0000 mg | ORAL_TABLET | Freq: Four times a day (QID) | ORAL | 1 refills | Status: DC

## 2020-12-24 NOTE — Progress Notes (Signed)
   Subjective:    Patient ID: Richard [name], male    DOB: 01/27/1956, 65 y.o.   MRN: [id number]  HPI Here for 3 days of a tender area on the right temple. He says this reminds him of a herpes infection he used to get some years ago. He would usually respond well to taking Acyclovir. He feels fine otherwise.    Review of Systems  Constitutional: Negative.   Respiratory: Negative.   Cardiovascular: Negative.   Skin: Positive for rash.       Objective:   Physical Exam Constitutional:      Appearance: Normal appearance.  Cardiovascular:     Rate and Rhythm: Normal rate and regular rhythm.     Pulses: Normal pulses.     Heart sounds: Normal heart sounds.  Pulmonary:     Effort: Pulmonary effort is normal.     Breath sounds: Normal breath sounds.  Skin:    Comments: There is a 2 cm area of erythema with papules on the right temple. This is tender but not warm   Neurological:     Mental Status: He is alert.           Assessment & Plan:  Herpes simplex. Treat with Acyclovir 800 mg QID.  Alysia Penna, MD

## 2021-01-29 ENCOUNTER — Other Ambulatory Visit: Payer: Self-pay | Admitting: Family Medicine

## 2021-02-19 ENCOUNTER — Other Ambulatory Visit: Payer: Self-pay | Admitting: Family Medicine

## 2021-05-06 ENCOUNTER — Other Ambulatory Visit: Payer: Self-pay | Admitting: Family Medicine

## 2021-05-30 ENCOUNTER — Encounter: Payer: Self-pay | Admitting: Family Medicine

## 2021-05-31 ENCOUNTER — Encounter: Payer: Self-pay | Admitting: Family Medicine

## 2021-05-31 ENCOUNTER — Other Ambulatory Visit: Payer: Self-pay

## 2021-05-31 ENCOUNTER — Telehealth (INDEPENDENT_AMBULATORY_CARE_PROVIDER_SITE_OTHER): Payer: 59 | Admitting: Family Medicine

## 2021-05-31 VITALS — Temp 99.9°F

## 2021-05-31 DIAGNOSIS — U071 COVID-19: Secondary | ICD-10-CM | POA: Diagnosis not present

## 2021-05-31 MED ORDER — BENZONATATE 100 MG PO CAPS: 100.0000 mg | ORAL_CAPSULE | Freq: Three times a day (TID) | ORAL | 0 refills | Status: DC | PRN

## 2021-05-31 MED ORDER — MOLNUPIRAVIR EUA 200MG CAPSULE: 4.0000 | ORAL_CAPSULE | Freq: Two times a day (BID) | ORAL | 0 refills | Status: AC

## 2021-05-31 NOTE — Progress Notes (Signed)
Chief Complaint  Patient presents with   Covid Positive   Generalized Body Aches    Tested positive 05/30/2021    Richard [name] here for URI complaints. Due to COVID-19 pandemic, we are interacting via web portal for an electronic face-to-face visit. I verified patient's ID using 2 identifiers. Patient agreed to proceed with visit via this method. Patient is at home, I am at office. Patient and I are present for visit.   Duration: 2 days  Associated symptoms: Fever (99's), sinus congestion, rhinorrhea, sore throat, wheezing, shortness of breath, myalgia, and coughing Denies: sinus pain, itchy watery eyes, ear pain, ear drainage, and N/V/D, loss of taste/smell Treatment to date: Tylenol Sick contacts: None known, recently travelled to [place] Tested + for covid today.  Had the initial series of [place], 2 boosters.   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 Benzonatate prn. Continue to push fluids, practice good hand hygiene, cover mouth when coughing, Tylenol prn.  Discussed quarantining guidelines. F/u prn. If starting to experience ear  replaceable fluid loss, shaking, or shortness of breath, seek immediate care. Pt voiced understanding and agreement to the plan.  [place], DO 05/31/21 12:41 PM

## 2021-05-31 NOTE — Telephone Encounter (Signed)
Patients scheduled

## 2021-05-31 NOTE — Telephone Encounter (Signed)
Please schedule virtual appointment with any available provider if is ok with patient  Thanks

## 2021-08-05 ENCOUNTER — Other Ambulatory Visit: Payer: Self-pay | Admitting: Family Medicine

## 2021-09-08 ENCOUNTER — Other Ambulatory Visit: Payer: Self-pay | Admitting: Family Medicine

## 2021-09-14 DIAGNOSIS — H401122 Primary open-angle glaucoma, left eye, moderate stage: Secondary | ICD-10-CM | POA: Diagnosis not present

## 2021-09-22 ENCOUNTER — Other Ambulatory Visit: Payer: Self-pay | Admitting: Family Medicine

## 2021-09-30 ENCOUNTER — Other Ambulatory Visit: Payer: Self-pay | Admitting: Family Medicine

## 2021-11-21 ENCOUNTER — Ambulatory Visit (INDEPENDENT_AMBULATORY_CARE_PROVIDER_SITE_OTHER): Payer: 59 | Admitting: Family Medicine

## 2021-11-21 VITALS — BP 142/74 | HR 79 | Temp 98.3°F | Ht 73.0 in | Wt 294.4 lb

## 2021-11-21 DIAGNOSIS — R739 Hyperglycemia, unspecified: Secondary | ICD-10-CM

## 2021-11-21 DIAGNOSIS — F3342 Major depressive disorder, recurrent, in full remission: Secondary | ICD-10-CM

## 2021-11-21 DIAGNOSIS — Z0001 Encounter for general adult medical examination with abnormal findings: Secondary | ICD-10-CM

## 2021-11-21 DIAGNOSIS — K5130 Ulcerative (chronic) rectosigmoiditis without complications: Secondary | ICD-10-CM

## 2021-11-21 DIAGNOSIS — R69 Illness, unspecified: Secondary | ICD-10-CM | POA: Diagnosis not present

## 2021-11-21 DIAGNOSIS — Z125 Encounter for screening for malignant neoplasm of prostate: Secondary | ICD-10-CM

## 2021-11-21 DIAGNOSIS — I10 Essential (primary) hypertension: Secondary | ICD-10-CM

## 2021-11-21 DIAGNOSIS — G4700 Insomnia, unspecified: Secondary | ICD-10-CM

## 2021-11-21 DIAGNOSIS — E785 Hyperlipidemia, unspecified: Secondary | ICD-10-CM | POA: Diagnosis not present

## 2021-11-21 LAB — CBC
HCT: 49.6 % (ref 39.0–52.0)
Hemoglobin: 16.7 g/dL (ref 13.0–17.0)
MCHC: 33.7 g/dL (ref 30.0–36.0)
MCV: 92.3 fl (ref 78.0–100.0)
Platelets: 212 10*3/uL (ref 150.0–400.0)
RBC: 5.37 Mil/uL (ref 4.22–5.81)
RDW: 13.7 % (ref 11.5–15.5)
WBC: 6.8 10*3/uL (ref 4.0–10.5)

## 2021-11-21 LAB — LIPID PANEL
Cholesterol: 164 mg/dL (ref 0–200)
HDL: 47.3 mg/dL (ref >39.00)
LDL Cholesterol: 98 mg/dL (ref 0–99)
NonHDL: 116.92
Total CHOL/HDL Ratio: 3
Triglycerides: 96 mg/dL (ref 0.0–149.0)
VLDL: 19.2 mg/dL (ref 0.0–40.0)

## 2021-11-21 LAB — COMPREHENSIVE METABOLIC PANEL
ALT: 36 U/L (ref 0–53)
AST: 53 U/L — ABNORMAL HIGH (ref 0–37)
Albumin: 4.8 g/dL (ref 3.5–5.2)
Alkaline Phosphatase: 44 U/L (ref 39–117)
BUN: 13 mg/dL (ref 6–23)
CO2: 24 mEq/L (ref 19–32)
Calcium: 9.3 mg/dL (ref 8.4–10.5)
Chloride: 101 mEq/L (ref 96–112)
Creatinine, Ser: 0.84 mg/dL (ref 0.40–1.50)
GFR: 92.56 mL/min (ref >60.00)
Glucose, Bld: 99 mg/dL (ref 70–99)
Potassium: 4.3 mEq/L (ref 3.5–5.1)
Sodium: 135 mEq/L (ref 135–145)
Total Bilirubin: 0.7 mg/dL (ref 0.2–1.2)
Total Protein: 7.1 g/dL (ref 6.0–8.3)

## 2021-11-21 LAB — TSH: TSH: 0.96 u[IU]/mL (ref 0.35–5.50)

## 2021-11-21 LAB — PSA: PSA: 3.56 ng/mL (ref 0.10–4.00)

## 2021-11-21 LAB — HEMOGLOBIN A1C: Hgb A1c MFr Bld: 5.3 % (ref 4.6–6.5)

## 2021-11-21 MED ORDER — ROSUVASTATIN CALCIUM 20 MG PO TABS
20.0000 mg | ORAL_TABLET | Freq: Every day | ORAL | 1 refills | Status: DC
Start: 2021-11-21 — End: 2022-01-09

## 2021-11-21 NOTE — Assessment & Plan Note (Signed)
On Crestor 20 mg daily.  Check labs.  Check cardiac CT scan.  Discussed lifestyle modifications. ?

## 2021-11-21 NOTE — Assessment & Plan Note (Signed)
Doing well off Lexapro.  We will take off medication list today. ?

## 2021-11-21 NOTE — Assessment & Plan Note (Signed)
Stable on trazodone 25 to 50 mg nightly as needed. ?

## 2021-11-21 NOTE — Patient Instructions (Signed)
It was very nice to see you today! ? ?We will check blood work today. ? ?We will also order a cardiac CT scan. ? ?We will refer you for your colonoscopy. ? ?Please continue to work on diet and exercise. ? ?I will see back in 1 year for your next physical.  Please come back to see me sooner if needed. ? ?Take care, ?Dr [name] ? ?PLEASE NOTE: ? ?If you had any lab tests please let us know if you have not heard back within a few days. You may see your results on mychart before we have a chance to review them but we will give you a call once they are reviewed by [place]. If we ordered any referrals today, please let us know if you have not heard from their office within the next week.  ? ?Please try these tips to maintain a healthy lifestyle: ? ?Eat at least 3 REAL meals and 1-2 snacks per day.  Aim for no more than 5 hours between eating.  If you eat breakfast, please do so within one hour of getting up.  ? ?Each meal should contain half fruits/vegetables, one quarter protein, and one quarter carbs (no bigger than a computer mouse) ? ?Cut down on sweet beverages. This includes juice, soda, and sweet tea.  ? ?Drink at least 1 glass of water with each meal and aim for at least 8 glasses per day ? ?Exercise at least 150 minutes every week.   ? ?Preventive Care 66-21 Years Old, Male ?Preventive care refers to lifestyle choices and visits with your health care provider that can promote health and wellness. Preventive care visits are also called wellness exams. ?What can I expect for my preventive care visit? ?Counseling ?During your preventive care visit, your health care provider may ask about your: ?Medical history, including: ?Past medical problems. ?Family medical history. ?Current health, including: ?Emotional well-being. ?Home life and relationship well-being. ?Sexual activity. ?Lifestyle, including: ?Alcohol, nicotine or tobacco, and drug use. ?Access to firearms. ?Diet, exercise, and sleep habits. ?Safety issues such as  seatbelt and bike helmet use. ?Sunscreen use. ?Work and work Statistician. ?Physical exam ?Your health care provider will check your: ?Height and weight. These may be used to calculate your BMI (body mass index). BMI is a measurement that tells if you are at a healthy weight. ?Waist circumference. This measures the distance around your waistline. This measurement also tells if you are at a healthy weight and may help predict your risk of certain diseases, such as type 2 diabetes and high blood pressure. ?Heart rate and blood pressure. ?Body temperature. ?Skin for abnormal spots. ?What immunizations do I need? ?Vaccines are usually given at various ages, according to a schedule. Your health care provider will recommend vaccines for you based on your age, medical history, and lifestyle or other factors, such as travel or where you work. ?What tests do I need? ?Screening ?Your health care provider may recommend screening tests for certain conditions. This may include: ?Lipid and cholesterol levels. ?Diabetes screening. This is done by checking your blood sugar (glucose) after you have not eaten for a while (fasting). ?Hepatitis B test. ?Hepatitis C test. ?HIV (human immunodeficiency virus) test. ?STI (sexually transmitted infection) testing, if you are at risk. ?Lung cancer screening. ?Prostate cancer screening. ?Colorectal cancer screening. ?Talk with your health care provider about your test results, treatment options, and if necessary, the need for more tests. ?Follow these instructions at home: ?Eating and drinking ? ?Eat a diet that  includes fresh fruits and vegetables, whole grains, lean protein, and low-fat dairy products. ?Take vitamin and mineral supplements as recommended by your health care provider. ?Do not drink alcohol if your health care provider tells you not to drink. ?If you drink alcohol: ?Limit how much you have to 0-2 drinks a day. ?Know how much alcohol is in your drink. In the [place] one drink  equals one 12 oz bottle of beer (355 mL), one 5 oz glass of wine (148 mL), or one 1? oz glass of hard liquor (44 mL). ?Lifestyle ?Brush your teeth every morning and night with fluoride toothpaste. Floss one time each day. ?Exercise for at least 30 minutes 5 or more days each week. ?Do not use any products that contain nicotine or tobacco. These products include cigarettes, chewing tobacco, and vaping devices, such as e-cigarettes. If you need help quitting, ask your health care provider. ?Do not use drugs. ?If you are sexually active, practice safe sex. Use a condom or other form of protection to prevent STIs. ?Take aspirin only as told by your health care provider. Make sure that you understand how much to take and what form to take. Work with your health care provider to find out whether it is safe and beneficial for you to take aspirin daily. ?Find healthy ways to manage stress, such as: ?Meditation, yoga, or listening to music. ?Journaling. ?Talking to a trusted person. ?Spending time with friends and family. ?Minimize exposure to UV radiation to reduce your risk of skin cancer. ?Safety ?Always wear your seat belt while driving or riding in a vehicle. ?Do not drive: ?If you have been drinking alcohol. Do not ride with someone who has been drinking. ?When you are tired or distracted. ?While texting. ?If you have been using any mind-altering substances or drugs. ?Wear a helmet and other protective equipment during sports activities. ?If you have firearms in your house, make sure you follow all gun safety procedures. ?What's next? ?Go to your health care provider once a year for an annual wellness visit. ?Ask your health care provider how often you should have your eyes and teeth checked. ?Stay up to date on all vaccines. ?This information is not intended to replace advice given to you by your health care provider. Make sure you discuss any questions you have with your health care provider. ?Document Revised:  02/16/2021 Document Reviewed: 02/16/2021 ?Elsevier Patient Education ? [employer]. ? ?

## 2021-11-21 NOTE — Progress Notes (Signed)
? ?Chief Complaint:  ?Richard [name] is a 66 y.o. male who presents today for his annual comprehensive physical exam.   ? ?Assessment/Plan:  ?Chronic Problems Addressed Today: ?Recurrent major depressive disorder, in full remission ([place]) ?Doing well off Lexapro.  We will take off medication list today. ? ?Ulcerative colitis ([place]) ?Stable.  Will refer to GI.  Needs repeat colonoscopy. ? ?Essential hypertension ?At goal per JNC 8 on amlodipine-benazepril 10-40 once daily. ? ?Hyperlipemia ?On Crestor 20 mg daily.  Check labs.  Check cardiac CT scan.  Discussed lifestyle modifications. ? ?Insomnia ?Stable on trazodone 25 to 50 mg nightly as needed. ? ? ?Preventative Healthcare: ?Check labs.  Up-to-date on vaccines. ? ?Patient Counseling(The following topics were reviewed and/or handout was given): ? -Nutrition: Stressed importance of moderation in sodium/caffeine intake, saturated fat and cholesterol, caloric balance, sufficient intake of fresh fruits, vegetables, and fiber. ? -Stressed the importance of regular exercise.  ? -Substance Abuse: Discussed cessation/primary prevention of tobacco, alcohol, or other drug use; driving or other dangerous activities under the influence; availability of treatment for abuse.  ? -Injury prevention: Discussed safety belts, safety helmets, smoke detector, smoking near bedding or upholstery.  ? -Sexuality: Discussed sexually transmitted diseases, partner selection, use of condoms, avoidance of unintended pregnancy and contraceptive alternatives.  ? -Dental health: Discussed importance of regular tooth brushing, flossing, and dental visits. ? -Health maintenance and immunizations reviewed. Please refer to Health maintenance section. ? ?Return to care in 1 year for next preventative visit.  ? ?  ?Subjective:  ?HPI: ? ?He has no acute complaints today.  ? ?Lifestyle ?Diet: Balanced. Plenty of fruits and vegetables.  ?Exercise: Goes to gym regularly.  ? ?Depression screen [facility] 2/9  11/21/2021  ?Decreased Interest 0  ?Down, Depressed, Hopeless 0  ?PHQ - 2 Score 0  ?Altered sleeping -  ?Tired, decreased energy -  ?Change in appetite -  ?Feeling bad or failure about yourself  -  ?Trouble concentrating -  ?Moving slowly or fidgety/restless -  ?Suicidal thoughts -  ?PHQ-9 Score -  ?Difficult doing work/chores -  ? ? ?Health Maintenance Due  ?Topic Date Due  ? COLONOSCOPY (Pts 45-79yr Insurance coverage will need to be confirmed)  06/16/2021  ?  ? ?ROS: Per HPI, otherwise a complete review of systems was negative.  ? ?PMH: ? ?The following were reviewed and entered/updated in epic: ?Past Medical History:  ?Diagnosis Date  ? ALLERGIC RHINITIS CAUSE UNSPECIFIED 05/21/2009  ? Allergy   ? BACK PAIN, LUMBAR, WITH RADICULOPATHY 03/18/2010  ? COLITIS, ULCERATIVE 05/28/2007  ? Glaucoma   ? 2 yrs ago eye md told does NOT have glaucoma, pt denies this dx   ? HYPERLIPIDEMIA 05/28/2007  ? HYPERTENSION 05/28/2007  ? HYPOGONADISM, MALE 07/24/2008  ? Internal hemorrhoids   ? MALE PATTERN BALDNESS 05/28/2007  ? Recurrent major depressive disorder, in full [place] 10/07/2015  ? with GAD   ? SLEEP APNEA 12/18/2008  ? wears CPAP  ? Sleep apnea   ? wears cpap nightly  ? Tubular adenoma of colon   ? ?Patient Active Problem List  ? Diagnosis Date Noted  ? Insomnia 04/24/2019  ? Hx of adenomatous colonic polyps 10/21/2015  ? Recurrent major depressive disorder, in full remission ([place] 10/07/2015  ? 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 Sleep apnea 12/18/2008  ? Hyperlipemia 05/28/2007  ? Essential hypertension 05/28/2007  ? [place]cerative colitis ([facility] 05/28/2007  ? ?Past Surgical History:  ?Procedure Laterality Date  ? COLONOSCOPY  02/2017  ?  hx polyps& IC  ? NASAL SINUS SURGERY    ? POLYPECTOMY    ? colon polyps  ? WISDOM TOOTH EXTRACTION    ? ? ?Family History  ?Problem Relation Age of Onset  ? Hyperlipidemia Father   ? Hypertension Father   ? Heart disease Father   ? Breast cancer Mother   ?     <50  ? Ulcerative colitis  Cousin   ?     2 cousins paternal side  ? Colon polyps Cousin   ? Colon cancer Neg Hx   ? Esophageal cancer Neg Hx   ? Stomach cancer Neg Hx   ? Rectal cancer Neg Hx   ? Prostate cancer Neg Hx   ? ? ?Medications- reviewed and updated ?Current Outpatient Medications  ?Medication Sig Dispense Refill  ? amLODipine-benazepril (LOTREL) 10-40 MG capsule TAKE 1 CAPSULE BY MOUTH EVERY DAY 90 capsule 1  ? finasteride (PROPECIA) 1 MG tablet TAKE 1 TABLET BY MOUTH EVERY DAY 90 tablet 3  ? Multiple Vitamins-Iron (MULTIVITAMIN/IRON) TABS Take 1 tablet by mouth daily.    ? NASONEX 50 MCG/ACT nasal spray PLACE 2 SQUIRTS IN EACH NOSTRIL DAILY 17 g 11  ? sulfaSALAzine (AZULFIDINE) 500 MG tablet TAKE 3 TABLETS BY MOUTH 2 TIMES A DAY 540 tablet 3  ? traZODone (DESYREL) 50 MG tablet TAKE 0.5-1 TABLETS (25-50 MG TOTAL) BY MOUTH AT BEDTIME AS NEEDED FOR SLEEP. 90 tablet 2  ? rosuvastatin (CRESTOR) 20 MG tablet Take 1 tablet (20 mg total) by mouth daily. 90 tablet 1  ? ?No current facility-administered medications for this visit.  ? ? ?Allergies-reviewed and updated ?No Known Allergies ? ?Social History  ? ?Socioeconomic History  ? Marital status: Single  ?  Spouse name: Not on file  ? Number of children: 0  ? Years of education: Not on file  ? Highest education level: Not on file  ?Occupational History  ? Occupation: Realtor  ?  Employer: [employer]  ?Tobacco Use  ? Smoking status: Former  ?  Years: 20.00  ?  Types: Cigarettes  ?  Quit date: 10/21/1999  ?  Years since quitting: 22.1  ? Smokeless tobacco: Never  ?Vaping Use  ? Vaping Use: Never used  ?Substance and Sexual Activity  ? Alcohol use: Yes  ?  Alcohol/week: 5.0 - 7.0 standard drinks  ?  Types: 5 - 7 Standard drinks or equivalent per week  ?  Comment: 1 drink per day at most  ? Drug use: No  ? Sexual activity: Not on file  ?Other Topics Concern  ? Not on file  ?Social History Narrative  ? 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   ? Home Situation: lives alone  ?   ? Spiritual  Beliefs: none  ?   ? Lifestyle: walks some; diet is not great  ?   ? ?Social Determinants of Health  ? ?Financial Resource Strain: Not on file  ?Food Insecurity: Not on file  ?Transportation Needs: Not on file  ?Physical Activity: Not on file  ?Stress: Not on file  ?Social Connections: Not on file  ? ?   ?  ?Objective:  ?Physical Exam: ?BP (!) 142/74 (BP Location: Right Arm)   Pulse 79   Temp 98.3 ?F (36.8 ?C) (Temporal)   Ht 6' 1"  (1.854 m)   Wt 294 lb 6.4 oz (133.5 kg)   SpO2 94%   BMI 38.84 kg/m?   ?Body mass index is 38.84 kg/m?. ?Wt  Readings from Last 3 Encounters:  ?11/21/21 294 lb 6.4 oz (133.5 kg)  ?12/24/20 292 lb (132.5 kg)  ?11/25/20 291 lb 9.6 oz (132.3 kg)  ? ?Gen: NAD, resting comfortably ?HEENT: TMs normal bilaterally. OP clear. No thyromegaly noted.  ?CV: RRR with no murmurs appreciated ?Pulm: NWOB, CTAB with no crackles, wheezes, or rhonchi ?GI: Normal bowel sounds present. Soft, Nontender, Nondistended. ?MSK: no edema, cyanosis, or clubbing noted ?Skin: warm, dry ?Neuro: CN2-12 grossly intact. Strength 5/5 in upper and lower extremities. Reflexes symmetric and intact bilaterally.  ?Psych: Normal affect and thought content ?   ? ?[name]. [name], MD ?11/21/2021 10:17 AM  ?

## 2021-11-21 NOTE — Assessment & Plan Note (Signed)
Stable.  Will refer to GI.  Needs repeat colonoscopy. ?

## 2021-11-21 NOTE — Assessment & Plan Note (Signed)
At goal per JNC 8 on amlodipine-benazepril 10-40 once daily. ?

## 2021-11-24 ENCOUNTER — Other Ambulatory Visit: Payer: Self-pay

## 2021-11-24 DIAGNOSIS — E785 Hyperlipidemia, unspecified: Secondary | ICD-10-CM

## 2021-11-24 NOTE — Progress Notes (Signed)
Please inform patient of the following: ? ?One of his liver numbers slightly elevated.  But everything else is normal. Can we have him come back in a couple of weeks to recheck CMET? We can recheck everything else in a year. ? ?Richard [name]. [name], MD ?11/24/2021 12:49 PM  ?

## 2021-12-08 ENCOUNTER — Other Ambulatory Visit (INDEPENDENT_AMBULATORY_CARE_PROVIDER_SITE_OTHER): Payer: 59

## 2021-12-08 DIAGNOSIS — E785 Hyperlipidemia, unspecified: Secondary | ICD-10-CM | POA: Diagnosis not present

## 2021-12-08 LAB — COMPREHENSIVE METABOLIC PANEL
ALT: 32 U/L (ref 0–53)
AST: 34 U/L (ref 0–37)
Albumin: 4.9 g/dL (ref 3.5–5.2)
Alkaline Phosphatase: 42 U/L (ref 39–117)
BUN: 18 mg/dL (ref 6–23)
CO2: 27 mEq/L (ref 19–32)
Calcium: 9.4 mg/dL (ref 8.4–10.5)
Chloride: 103 mEq/L (ref 96–112)
Creatinine, Ser: 0.88 mg/dL (ref 0.40–1.50)
GFR: 91.24 mL/min (ref >60.00)
Glucose, Bld: 103 mg/dL — ABNORMAL HIGH (ref 70–99)
Potassium: 4.3 mEq/L (ref 3.5–5.1)
Sodium: 136 mEq/L (ref 135–145)
Total Bilirubin: 0.6 mg/dL (ref 0.2–1.2)
Total Protein: 7.3 g/dL (ref 6.0–8.3)

## 2021-12-12 NOTE — Progress Notes (Signed)
Please inform patient of the following: ? ?Liver numbers are back to normal. Do not need to do any further testing at this point. We can recheck at his next office visit. ? ?Richard [name]. [name], MD ?12/12/2021 12:58 PM  ?

## 2021-12-19 DIAGNOSIS — H25011 Cortical age-related cataract, right eye: Secondary | ICD-10-CM | POA: Diagnosis not present

## 2021-12-19 DIAGNOSIS — H2513 Age-related nuclear cataract, bilateral: Secondary | ICD-10-CM | POA: Diagnosis not present

## 2021-12-19 DIAGNOSIS — H401122 Primary open-angle glaucoma, left eye, moderate stage: Secondary | ICD-10-CM | POA: Diagnosis not present

## 2022-01-04 ENCOUNTER — Ambulatory Visit (INDEPENDENT_AMBULATORY_CARE_PROVIDER_SITE_OTHER)
Admission: RE | Admit: 2022-01-04 | Discharge: 2022-01-04 | Disposition: A | Discharge status: Home / Self Care | Payer: Self-pay | Source: Ambulatory Visit | Attending: Family Medicine | Admitting: Family Medicine

## 2022-01-04 DIAGNOSIS — Z0001 Encounter for general adult medical examination with abnormal findings: Secondary | ICD-10-CM

## 2022-01-04 DIAGNOSIS — E785 Hyperlipidemia, unspecified: Secondary | ICD-10-CM

## 2022-01-05 ENCOUNTER — Other Ambulatory Visit: Payer: Self-pay

## 2022-01-05 DIAGNOSIS — I38 Endocarditis, valve unspecified: Secondary | ICD-10-CM

## 2022-01-05 NOTE — Progress Notes (Signed)
Please inform patient of the following: ? ?His cardiac score shows calcification in his heart at a higher rate than average for his age.  Recommend referral to cardiology.  Please place if he is agreeable. ? ?Richard [name]. Richard Pain, MD ?01/05/2022 12:39 PM  ?

## 2022-01-09 ENCOUNTER — Ambulatory Visit (INDEPENDENT_AMBULATORY_CARE_PROVIDER_SITE_OTHER): Payer: 59 | Admitting: Internal Medicine

## 2022-01-09 ENCOUNTER — Other Ambulatory Visit: Payer: Self-pay | Admitting: Family Medicine

## 2022-01-09 ENCOUNTER — Encounter: Payer: Self-pay | Admitting: Internal Medicine

## 2022-01-09 VITALS — BP 136/70 | HR 90 | Ht 73.0 in | Wt 297.0 lb

## 2022-01-09 DIAGNOSIS — E785 Hyperlipidemia, unspecified: Secondary | ICD-10-CM

## 2022-01-09 DIAGNOSIS — I2510 Atherosclerotic heart disease of native coronary artery without angina pectoris: Secondary | ICD-10-CM | POA: Diagnosis not present

## 2022-01-09 DIAGNOSIS — I700 Atherosclerosis of aorta: Secondary | ICD-10-CM | POA: Diagnosis not present

## 2022-01-09 DIAGNOSIS — I7121 Aneurysm of the ascending aorta, without rupture: Secondary | ICD-10-CM | POA: Diagnosis not present

## 2022-01-09 DIAGNOSIS — I10 Essential (primary) hypertension: Secondary | ICD-10-CM | POA: Diagnosis not present

## 2022-01-09 DIAGNOSIS — Z6839 Body mass index (BMI) 39.0-39.9, adult: Secondary | ICD-10-CM | POA: Diagnosis not present

## 2022-01-09 HISTORY — DX: Atherosclerotic heart disease of native coronary artery without angina pectoris: I25.10

## 2022-01-09 MED ORDER — ROSUVASTATIN CALCIUM 40 MG PO TABS: 40.0000 mg | ORAL_TABLET | Freq: Every day | ORAL | 3 refills | Status: DC

## 2022-01-09 MED ORDER — ASPIRIN EC 81 MG PO TBEC: 81.0000 mg | DELAYED_RELEASE_TABLET | Freq: Every day | ORAL | 3 refills | Status: AC

## 2022-01-09 MED ORDER — METOPROLOL SUCCINATE ER 25 MG PO TB24: 12.5000 mg | ORAL_TABLET | Freq: Every day | ORAL | 3 refills | Status: DC

## 2022-01-09 NOTE — Progress Notes (Signed)
?Cardiology Office Note:   ? ?Date:  01/09/2022  ? ?ID:  Richard [name], DOB 08/01/1956, MRN [id number] ? ?PCP:  Richard Barrack, MD  ? ?[facility] Providers ?Cardiologist:  Richard Sciara, MD ?Referring MD: Richard Barrack, MD  ? ?Chief Complaint/Reason for Referral: Coronary artery calcification ? ?ASSESSMENT:   ? ?1. Coronary artery calcification seen on CAT scan   ?2. Aortic atherosclerosis ([place])   ?3. Essential hypertension   ?4. Hyperlipidemia, unspecified hyperlipidemia type   ?5. Aneurysm of ascending aorta without rupture (HCC)   ?6. BMI 39.0-39.9,adult   ? ? ?PLAN:   ? ?In order of problems listed above: ?1.  Coronary artery calcification: Start aspirin 81 mg daily and continue Crestor.  Will need strict blood pressure control.  Follow-up in 1 year or earlier if needed.  I did speak to him about testosterone supplementation.  While there are no studies showing that there are increased cardiovascular risk in patients on testosterone supplementation these did not include patients with history of cardiovascular risk factors or coronary artery calcification.  I counseled him that by losing weight, remaining compliant with CPAP, and exercising he will probably feel better and have more energy.   ?2.  Aortic atherosclerosis: Start aspirin 81 mg today, continue Crestor, and strict blood pressure control. ?3.  Hypertension: Start Toprol-XL 12.5 mg at bedtime given aortic aneurysm. ?4.  Hyperlipidemia: Recent lipid panel in March showed an LDL of 104.  Given coronary artery calcification patient's goal LDL is less than 70.  We will increase Crestor to 40 mg and check lipid panel, LFTs, and LP(a) in 2 months. ?5.  Ascending aortic aneurysm: We will schedule CT scan in 1 year. ?6.  Elevated BMI: Encouraged diet and exercise; he would like to meet with the pharmacy division regarding possible pharmacotherapy. ? ?   ? ?Dispo:  Return in about 1 year (around 01/10/2023).  ? ?  ? ?Medication Adjustments/Labs and  Tests Ordered: ?Current medicines are reviewed at length with the patient today.  Concerns regarding medicines are outlined above. ? ?The following changes have been made:    ? ?Labs/tests ordered: ?No orders of the defined types were placed in this encounter. ? ? ?Medication Changes: ?No orders of the defined types were placed in this encounter. ? ? ? ?Current medicines are reviewed at length with the patient today.  The patient does not have concerns regarding medicines. ? ? ?History of Present Illness:   ? ?FOCUSED PROBLEM LIST:   ?1.  Left main, LAD, and left circumflex calcification seen on CT 2023 ?2.  Ascending aortic aneurysm of 4.4 cm seen on CT 2023 requiring yearly follow-up ?3.  Hyperlipidemia ?4.  Hypertension ?5.  Ulcerative colitis ?6.  OSA on CPAP ? ?The patient is a 66 y.o. male with the indicated medical history here for recommendations regarding elevated calcium score.  He was seen by his primary care provider in the last few months and was doing well.  He was referred for calcium score for screening purposes and this demonstrated elevated coronary calcium levels. ? ?The patient works full-time as a very Automotive engineer.  He is under a lot of stress.  He denies any exertional angina or dyspnea.  He used to work out at [employer] prior to him contracting COVID back in the fall.  At that time he was working out on a treadmill and exercising with weights.  He does not smoke.  He does not complain of any presyncope or  syncope.  He denies any palpitations or paroxysmal nocturnal dyspnea.  He has required no emergency room visits or hospitalizations.  He is otherwise well without significant complaints. ? ? ?Current Medications: ?Current Meds  ?Medication Sig  ? amLODipine-benazepril (LOTREL) 10-40 MG capsule TAKE 1 CAPSULE BY MOUTH EVERY DAY  ? finasteride (PROPECIA) 1 MG tablet TAKE 1 TABLET BY MOUTH EVERY DAY  ? Multiple Vitamins-Iron (MULTIVITAMIN/IRON) TABS Take 1 tablet by mouth daily.  ? NASONEX 50  MCG/ACT nasal spray PLACE 2 SQUIRTS IN EACH NOSTRIL DAILY  ? rosuvastatin (CRESTOR) 20 MG tablet Take 1 tablet (20 mg total) by mouth daily.  ? sulfaSALAzine (AZULFIDINE) 500 MG tablet TAKE 3 TABLETS BY MOUTH 2 TIMES A DAY.  ? TESTOSTERONE ENANTHATE IJ Inject as directed.  ? traZODone (DESYREL) 50 MG tablet TAKE 0.5-1 TABLETS (25-50 MG TOTAL) BY MOUTH AT BEDTIME AS NEEDED FOR SLEEP.  ? VIT B12-METHIONINE-INOS-CHOL IM Inject into the muscle.  ?  ? ?Allergies:    ?Patient has no known allergies.  ? ?Social History:   ?Social History  ? ?Tobacco Use  ? Smoking status: Former  ?  Years: 20.00  ?  Types: Cigarettes  ?  Quit date: 10/21/1999  ?  Years since quitting: 22.2  ? Smokeless tobacco: Never  ?Vaping Use  ? Vaping Use: Never used  ?Substance Use Topics  ? Alcohol use: Yes  ?  Alcohol/week: 5.0 - 7.0 standard drinks  ?  Types: 5 - 7 Standard drinks or equivalent per week  ?  Comment: 1 drink per day at most  ? Drug use: No  ?  ? ?Family Hx: ?Family History  ?Problem Relation Age of Onset  ? Hyperlipidemia Father   ? Hypertension Father   ? Heart disease Father   ? Breast cancer Mother   ?     <50  ? Ulcerative colitis Cousin   ?     2 cousins paternal side  ? Colon polyps Cousin   ? Colon cancer Neg Hx   ? Esophageal cancer Neg Hx   ? Stomach cancer Neg Hx   ? Rectal cancer Neg Hx   ? Prostate cancer Neg Hx   ?  ? ?Review of Systems:   ?Please see the history of present illness.    ?All other systems reviewed and are negative. ?  ? ? ?EKGs/Labs/Other Test Reviewed:   ? ?EKG:  EKG performed 2010 that I personally reviewed demonstrates normal sinus rhythm; EKG performed today that I personally reviewed demonstrates sinus rhythm. ? ?Prior CV studies: ? ?2023 Calcium score CT ?Coronary arteries: Normal origins. ?Coronary Calcium Score: ?Left main: 5.02 ?Left anterior descending artery: 292 ?Left circumflex artery: 61.5 ?Right coronary artery: 0 ?  ?Ascending Aorta: 4.4 cm ascending aorta. ?  ?IMPRESSION: ?Coronary  calcium score of 358 Agatston units. This was 80th ?percentile for age-, race-, and sex-matched controls. ?  ?Dilated ascending aorta, 4.4 cm.  Recommend yearly followup ? ?Other studies Reviewed: ?Review of the additional studies/records demonstrates: None relevant ? ?Recent Labs: ?11/21/2021: Hemoglobin 16.7; Platelets 212.0; TSH 0.96 ?12/08/2021: ALT 32; BUN 18; Creatinine, Ser 0.88; Potassium 4.3; Sodium 136  ? ?Recent Lipid Panel ?Lab Results  ?Component Value Date/Time  ? CHOL 164 11/21/2021 10:21 AM  ? TRIG 96.0 11/21/2021 10:21 AM  ? TRIG 112 08/14/2006 09:19 AM  ? HDL 47.30 11/21/2021 10:21 AM  ? LDLCALC 98 11/21/2021 10:21 AM  ? LDLDIRECT 113.0 11/04/2019 01:56 PM  ? ? ?Risk Assessment/Calculations:   ? ?  ?    ? ?  Physical Exam:   ? ?VS:  BP 136/70   Pulse 90   Ht 6' 1"  (1.854 m)   Wt 297 lb (134.7 kg)   SpO2 97%   BMI 39.18 kg/m?    ?Wt Readings from Last 3 Encounters:  ?01/09/22 297 lb (134.7 kg)  ?11/21/21 294 lb 6.4 oz (133.5 kg)  ?12/24/20 292 lb (132.5 kg)  ?  ?GENERAL:  No apparent distress, AOx3 ?HEENT:  No carotid bruits, +2 carotid impulses, no scleral icterus ?CAR: RRR no murmurs, gallops, rubs, or thrills ?RES:  Clear to auscultation bilaterally ?ABD:  Soft, nontender, nondistended, positive bowel sounds x 4 ?VASC:  +2 radial pulses, +2 carotid pulses, palpable pedal pulses ?NEURO:  CN 2-12 grossly intact; motor and sensory grossly intact ?PSYCH:  No active depression or anxiety ?EXT:  No edema, ecchymosis, or cyanosis ? ?Signed, ?Early Osmond, MD  ?01/09/2022 3:59 PM    ?[facility] ?[address] [place], [place], [place]  [address] ?Phone: [phone]; Fax: [phone]  ? ?Note:  This document was prepared using Dragon voice recognition software and may include unintentional dictation errors. ?

## 2022-01-09 NOTE — Patient Instructions (Signed)
Medication Instructions:  ?Your physician has recommended you make the following change in your medication:  ?1.) start aspirin 81 mg - one table daily ?2.) start metoprolol succinate (Toprol XL) 25 mg - take HALF TABLET daily at bedtime ?3.) increase rosuvastatin (Crestor) to 40 mg daily ? ?*If you need a refill on your cardiac medications before your next appointment, please call your pharmacy* ? ? ?Lab Work: ?Please return in 2 months for blood work (lipids/liver/Lp(a) ? ?If you have labs (blood work) drawn today and your tests are completely normal, you will receive your results only by: ?MyChart Message (if you have MyChart) OR ?A paper copy in the mail ?If you have any lab test that is abnormal or we need to change your treatment, we will call you to review the results. ? ? ?Testing/Procedures: ?Ct Chest Aorta - due in one year (May 2024) ? ? ?Follow-Up: ?At [facility], you and your health needs are our priority.  As part of our continuing mission to provide you with exceptional heart care, we have created designated Provider Care Teams.  These Care Teams include your primary Cardiologist (physician) and Advanced Practice Providers (APPs -  Physician Assistants and Nurse Practitioners) who all work together to provide you with the care you need, when you need it. ? ? ?Your next appointment:   ?12 month(s) ? ?The format for your next appointment:   ?In Person ? ?Provider:   ?Early Osmond, MD   ? ? ?Other Instructions ?You have been referred to PharmD (clinical pharmacist) for weight management. ? ? ?Important Information About Sugar ? ? ? ? ?  ?

## 2022-01-16 DIAGNOSIS — R41840 Attention and concentration deficit: Secondary | ICD-10-CM | POA: Diagnosis not present

## 2022-02-03 DIAGNOSIS — H401122 Primary open-angle glaucoma, left eye, moderate stage: Secondary | ICD-10-CM | POA: Diagnosis not present

## 2022-02-13 ENCOUNTER — Ambulatory Visit: Payer: 59

## 2022-03-02 ENCOUNTER — Ambulatory Visit: Payer: 59

## 2022-03-08 ENCOUNTER — Encounter: Payer: Self-pay | Admitting: Family Medicine

## 2022-03-09 ENCOUNTER — Other Ambulatory Visit: Payer: 59 | Admitting: *Deleted

## 2022-03-09 DIAGNOSIS — I700 Atherosclerosis of aorta: Secondary | ICD-10-CM

## 2022-03-09 DIAGNOSIS — E785 Hyperlipidemia, unspecified: Secondary | ICD-10-CM

## 2022-03-09 DIAGNOSIS — I7121 Aneurysm of the ascending aorta, without rupture: Secondary | ICD-10-CM

## 2022-03-09 DIAGNOSIS — Z6839 Body mass index (BMI) 39.0-39.9, adult: Secondary | ICD-10-CM

## 2022-03-09 DIAGNOSIS — I2510 Atherosclerotic heart disease of native coronary artery without angina pectoris: Secondary | ICD-10-CM

## 2022-03-09 DIAGNOSIS — I10 Essential (primary) hypertension: Secondary | ICD-10-CM

## 2022-03-10 LAB — HEPATIC FUNCTION PANEL
ALT: 25 IU/L (ref 0–44)
AST: 29 IU/L (ref 0–40)
Albumin: 4.7 g/dL (ref 3.8–4.8)
Alkaline Phosphatase: 64 IU/L (ref 44–121)
Bilirubin Total: 0.6 mg/dL (ref 0.0–1.2)
Bilirubin, Direct: 0.18 mg/dL (ref 0.00–0.40)
Total Protein: 7.1 g/dL (ref 6.0–8.5)

## 2022-03-10 LAB — LIPID PANEL
Chol/HDL Ratio: 3.4 ratio (ref 0.0–5.0)
Cholesterol, Total: 163 mg/dL (ref 100–199)
HDL: 48 mg/dL (ref >39)
LDL Chol Calc (NIH): 90 mg/dL (ref 0–99)
Triglycerides: 142 mg/dL (ref 0–149)
VLDL Cholesterol Cal: 25 mg/dL (ref 5–40)

## 2022-03-10 LAB — LIPOPROTEIN A (LPA): Lipoprotein (a): 284.4 nmol/L — ABNORMAL HIGH (ref <75.0)

## 2022-03-13 ENCOUNTER — Encounter: Payer: Self-pay | Admitting: Gastroenterology

## 2022-03-13 ENCOUNTER — Telehealth: Payer: Self-pay

## 2022-03-13 ENCOUNTER — Other Ambulatory Visit: Payer: Self-pay | Admitting: Family Medicine

## 2022-03-13 DIAGNOSIS — E785 Hyperlipidemia, unspecified: Secondary | ICD-10-CM

## 2022-03-13 NOTE — Telephone Encounter (Signed)
-----   Message from Early Osmond, MD sent at 03/10/2022  6:00 AM EDT ----- Please refer to Dr. Debara Pickett re: lipid management, elevated Lp(a)

## 2022-03-13 NOTE — Telephone Encounter (Signed)
Patient notified via [facility]. Referral has been placed.

## 2022-03-21 ENCOUNTER — Ambulatory Visit: Payer: 59 | Admitting: Pharmacist

## 2022-03-21 DIAGNOSIS — E6609 Other obesity due to excess calories: Secondary | ICD-10-CM | POA: Diagnosis not present

## 2022-03-21 DIAGNOSIS — Z6839 Body mass index (BMI) 39.0-39.9, adult: Secondary | ICD-10-CM

## 2022-03-21 DIAGNOSIS — E669 Obesity, unspecified: Secondary | ICD-10-CM | POA: Insufficient documentation

## 2022-03-21 NOTE — Patient Instructions (Signed)
I will call you once I hear back from your insurance.  Please call me with any questions [phone]  GLP-1 Receptor Agonist Counseling Points This medication reduces your appetite and may make you feel fuller longer.  Stop eating when your body tells you that you are full. This will likely happen sooner than you are used to. Store your medication in the fridge until you are ready to use it. Inject your medication in the fatty tissue of your lower abdominal area (2 inches away from belly button) or upper outer thigh. Rotate injection sites. Each pen will last you about 1 month (the first month it will last a few weeks longer). Use a different needle with each weekly injection. Common side effects include: nausea, diarrhea/constipation, and heartburn, and are more likely to occur if you overeat.  Dosing schedule: GLP1 Agonist Titration Plan:  Will plan to follow the titration plan as below, pending patient is tolerating each dose before increasing to the next. Can slow titration if needed for tolerability.   Saxenda to [employer]:  -Week 1: Saxenda 0.6 mg daily x 7 days -Week 2: Saxenda 1.2 mg daily x 7 days -Week 3: Saxenda 1.8 mg daily x 7 days -Week 4: Saxenda 2.4 mg daily x 7 days -Week 5 & 6: Saxenda 3 mg daily x 14 days -Week 7-10: Stop Saxenda. Start Wegovy 1.7 mg weekly x 4 weeks -Week 11 & on: Wegovy 2.4 mg weekly   Tips for living a healthier life     Building a Healthy and Balanced Diet Make most of your meal vegetables and fruits -  of your plate. Aim for color and variety, and remember that potatoes don't count as vegetables on the Healthy Eating Plate because of their negative impact on blood sugar.  Go for whole grains -  of your plate. Whole and intact grains--whole wheat, barley, wheat berries, quinoa, oats, brown rice, and foods made with them, such as whole wheat pasta--have a milder effect on blood sugar and insulin than white bread, white rice, and other refined  grains.  Protein power -  of your plate. Fish, poultry, beans, and nuts are all healthy, versatile protein sources--they can be mixed into salads, and pair well with vegetables on a plate. Limit red meat, and avoid processed meats such as bacon and sausage.  Healthy plant oils - in moderation. Choose healthy vegetable oils like olive, canola, soy, corn, sunflower, peanut, and others, and avoid partially hydrogenated oils, which contain unhealthy trans fats. Remember that low-fat does not mean "healthy."  Drink water, coffee, or tea. Skip sugary drinks, limit milk and dairy products to one to two servings per day, and limit juice to a small glass per day.  Stay active. The red figure running across the [place] is a reminder that staying active is also important in weight control.  The main message of the Healthy Eating Plate is to focus on diet quality:  The type of carbohydrate in the diet is more important than the amount of carbohydrate in the diet, because some sources of carbohydrate--like vegetables (other than potatoes), fruits, whole grains, and beans--are healthier than others. The Healthy Eating Plate also advises consumers to avoid sugary beverages, a major source of calories--usually with little nutritional value--in the American diet. The Healthy Eating Plate encourages consumers to use healthy oils, and it does not set a maximum on the percentage of calories people should get each day from healthy sources of fat. In this way,  the Healthy Eating Plate recommends the opposite of the low-fat message promoted for decades by the USDA.  [employer]kDistributor.no  SUGAR  Sugar is a huge problem in the modern day diet. Sugar is a big contributor to heart disease, diabetes, high triglyceride levels, fatty liver disease and obesity. Sugar is hidden in almost all packaged foods/beverages. Added sugar is extra sugar that is  added beyond what is naturally found and has no nutritional benefit for your body. The American Heart Association recommends limiting added sugars to no more than 25g for women and 36 grams for men per day. There are many names for sugar including maltose, sucrose (names ending in "ose"), high fructose corn syrup, molasses, cane sugar, corn sweetener, raw sugar, syrup, honey or fruit juice concentrate.   One of the best ways to limit your added sugars is to stop drinking sweetened beverages such as soda, sweet tea, and fruit juice.  There is 65g of added sugars in one 20oz bottle of Coke! That is equal to 7.5 donuts.   Pay attention and read all nutrition facts labels. Below is an examples of a nutrition facts label. The #1 is showing you the total sugars where the # 2 is showing you the added sugars. This one serving has almost the max amount of added sugars per day!     20 oz Soda 65g Sugar = 7.5 Glazed Donuts  16oz Energy  Drink 54g Sugar = 6.5 Glazed Donuts  Large Sweet  Tea 38g Sugar = 4 Glazed Donuts  20oz Sports  Drink 34g Sugar = 3.5 Glazed Donuts  8oz Chocolate Milk 24g Sugar =2.5 Glazed Donuts  8oz Orange  Juice 21g Sugar = 2 Glazed Donuts  1 Juice Box 14g Sugar = 1.5 Glazed Donuts  16oz Water= NO SUGAR!!  EXERCISE  Exercise is good. We've all heard that. In an ideal world, we would all have time and resources to get plenty of it. When you are active, your heart pumps more efficiently and you will feel better.  Multiple studies show that even walking regularly has benefits that include living a longer life. The American Heart Association recommends 150 minutes per week of exercise (30 minutes per day most days of the week). You can do this in any increment you wish. Nine or more 10-minute walks count. So does an hour-long exercise class. Break the time apart into what will work in your life. Some of the best things you can do include walking briskly, jogging, cycling  or swimming laps. Not everyone is ready to "exercise." Sometimes we need to start with just getting active. Here are some easy ways to be more active throughout the day:  Take the stairs instead of the elevator  Go for a 10-15 minute walk during your lunch break (find a friend to make it more enjoyable)  When shopping, park at the back of the parking lot  If you take public transportation, get off one stop early and walk the extra distance  Pace around while making phone calls  Check with your doctor if you aren't sure what your limitations may be. Always remember to drink plenty of water when doing any type of exercise. Don't feel like a failure if you're not getting the 90-150 minutes per week. If you started by being a couch potato, then just a 10-minute walk each day is a huge improvement. Start with little victories and work your way up.   HEALTHY EATING TIPS  When looking to improve your  eating habits, whether to lose weight, lower blood pressure or just be healthier, it helps to know what a serving size is.   Grains 1 slice of bread,  bagel,  cup pasta or rice  Vegetables 1 cup fresh or raw vegetables,  cup cooked or canned Fruits 1 piece of medium sized fruit,  cup canned,   Meats/Proteins  cup dried       1 oz meat, 1 egg,  cup cooked beans, nuts or seeds  Dairy        Fats Individual yogurt container, 1 cup (8oz)    1 teaspoon margarine/butter or vegetable  milk or milk alternative, 1 slice of cheese          oil; 1 tablespoon mayonnaise or salad dressing                  Plan ahead: make a menu of the meals for a week then create a grocery list to go with that menu. Consider meals that easily stretch into a night of leftovers, such as stews or casseroles. Or consider making two of your favorite meal and put one in the freezer for another night. Try a night or two each week that is "meatless" or "no cook" such as salads. When you get home from the grocery store wash and  prepare your vegetables and fruits. Then when you need them they are ready to go.   Tips for going to the grocery store:  [place] store or generic brands  Check the weekly ad from your store on-line or in their in-store flyer  Look at the unit price on the shelf tag to compare/contrast the costs of different items  Buy fruits/vegetables in season  Carrots, bananas and apples are low-cost, naturally healthy items  If meats or frozen vegetables are on sale, buy some extras and put in your freezer  Limit buying prepared or "ready to eat" items, even if they are pre-made salads or fruit snacks  Do not shop when you're hungry  Foods at eye level tend to be more expensive. Look on the high and low shelves for deals.  Consider shopping at the farmer's market for fresh foods in season.  Avoid the cookie and chip aisles (these are expensive, high in calories and low in nutritional value). Shop on the outside of the grocery store.  Healthy food preparations:  If you can't get lean hamburger, be sure to drain the fat when cooking  Steam, saut (in olive oil), grill or bake foods  Experiment with different seasonings to avoid adding salt to your foods. Kosher salt, sea salt and Himalayan salt are all still salt and should be avoided. Try seasoning food with onion, garlic, thyme, rosemary, basil ect. Onion powder or garlic powder is ok. Avoid if it says salt (ie garlic salt).

## 2022-03-21 NOTE — Progress Notes (Signed)
Patient ID: Richard [name]                 DOB: 1956/01/05                    MRN: [id number]     HPI: Richard [name] is a 66 y.o. male patient referred to pharmacy clinic by Dr. Ali Lowe to initiate weight loss therapy with GLP1-RA. PMH is significant for obesity, CAD, HTN and HLD. Most recent BMI 39.  Patient presents today to [facility] clinic. He states he has lost weight in the past. Has been down to 270 previous. 302 today. He tried the [employer] but got sick of it. Has also done low carb. He use to go to gym everyday but does not anymore. States he knows what to do, just needs to do it.   PA for [employer] has been sent to insurance, but waiting on outcome.   Current weight management medications: none  Previously tried meds: none  Current meds that may affect weight:  Baseline weight/BMI: 302lb/39.8  Insurance payor: [employer]  Diet:  -Breakfast: nothing or protein bars -Lunch: english muffin w/ [place] and cheese -Dinner:  -Snacks: -Drinks: water  Exercise: none  Family History:  Family History  Problem Relation Age of Onset   Hyperlipidemia Father    Hypertension Father    Heart disease Father    Breast cancer Mother        <50   Ulcerative colitis Cousin        2 cousins paternal side   Colon polyps Cousin    Colon cancer Neg Hx    Esophageal cancer Neg Hx    Stomach cancer Neg Hx    Rectal cancer Neg Hx    Prostate cancer Neg Hx      Social History: 2 drinks 2-3 nights per week, no tobacco  Labs: Lab Results  Component Value Date   HGBA1C 5.3 11/21/2021    Wt Readings from Last 1 Encounters:  01/09/22 297 lb (134.7 kg)    BP Readings from Last 1 Encounters:  01/09/22 136/70   Pulse Readings from Last 1 Encounters:  01/09/22 90       Component Value Date/Time   CHOL 163 03/09/2022 0840   TRIG 142 03/09/2022 0840   TRIG 112 08/14/2006 0919   HDL 48 03/09/2022 0840   CHOLHDL 3.4 03/09/2022 0840   CHOLHDL 3 11/21/2021 1021    VLDL 19.2 11/21/2021 1021   [place] 90 03/09/2022 0840   LDLDIRECT 113.0 11/04/2019 1356    Past Medical History:  Diagnosis Date   ALLERGIC RHINITIS CAUSE UNSPECIFIED 05/21/2009   Allergy    BACK PAIN, LUMBAR, WITH RADICULOPATHY 03/18/2010   COLITIS, ULCERATIVE 05/28/2007   Glaucoma    2 yrs ago eye md told does NOT have glaucoma, pt denies this dx    HYPERLIPIDEMIA 05/28/2007   HYPERTENSION 05/28/2007   HYPOGONADISM, MALE 07/24/2008   Internal hemorrhoids    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 Swallow whole. 90 tablet 3   finasteride (PROPECIA) 1 MG tablet TAKE 1 TABLET BY MOUTH EVERY DAY 90 tablet 3   metoprolol succinate (TOPROL XL) 25 MG 24 hr tablet Take 0.5 tablets (12.5 mg total) by mouth at bedtime. 45 tablet 3   Multiple Vitamins-Iron (MULTIVITAMIN/IRON) TABS Take 1 tablet by mouth daily.     NASONEX 50 MCG/ACT nasal spray PLACE 2 SQUIRTS IN EACH NOSTRIL DAILY 17 g 11   rosuvastatin (CRESTOR) 40 MG tablet Take 1 tablet (40 mg total) by mouth daily. 90 tablet 3   sulfaSALAzine (AZULFIDINE) 500 MG tablet TAKE 3 TABLETS BY MOUTH 2 TIMES A DAY. 540 tablet 3   TESTOSTERONE ENANTHATE IJ Inject as directed.     traZODone (DESYREL) 50 MG tablet TAKE 0.5-1 TABLETS (25-50 MG TOTAL) BY MOUTH AT BEDTIME AS NEEDED FOR SLEEP. 90 tablet 2   VIT B12-METHIONINE-INOS-CHOL IM Inject into the muscle.     No current facility-administered medications on file prior to visit.    No Known Allergies   Assessment/Plan:  1. Weight loss - Patient has not met  goal of at least 5% of body weight loss with comprehensive lifestyle modifications alone in the past 3-6 months. Pharmacotherapy is appropriate to pursue as augmentation. Will start [place] if approved. Confirmed patient has no personal or family history of medullary thyroid carcinoma (MTC) or Multiple Endocrine Neoplasia syndrome type 2 (MEN 2). No hx of gallstones, pancreatitis, or retinopathy.  Advised patient on common side effects including nausea, diarrhea, dyspepsia, decreased appetite, and fatigue. Counseled patient on reducing meal size and how to titrate medication to minimize side effects. Counseled patient to call if intolerable side effects or if experiencing dehydration, abdominal pain, or dizziness. Patient will adhere to dietary modifications and will target at least 150 minutes of moderate intensity exercise weekly.   I encouraged a diet full of vegetables, fruits, whole grains, nuts, seeds, legumes. I discouraged all overly processed foods and refined grains. Fermented dairy ok.   Injection technique reviewed at today's visit.   Titration Plan:  Will plan to follow the titration plan as below, pending patient is tolerating each dose before increasing to the next. Can slow titration if needed for tolerability.    -Week 1: Saxenda 0.6 mg daily x 7 days -Week 2: Saxenda 1.2 mg daily x 7 days -Week 3: Saxenda 1.8 mg daily x 7 days -Week 4: Saxenda 2.4 mg daily x 7 days -Week 5 & 6: Saxenda 3 mg daily x 14 days -Week 7-10: Stop Saxenda. Start Wegovy 1.7 mg weekly x 4 weeks -Week 11 & on: Wegovy 2.4 mg weekly  Will follow up on PA status of Saxenda and let patient know once determination is made.  Thank you,  Ramond Dial, Pharm.D, BCPS, CPP [facility]  [address] [place], [place] [address]  Phone: [phone]; Fax: [phone]

## 2022-03-24 ENCOUNTER — Telehealth: Payer: Self-pay | Admitting: Pharmacist

## 2022-03-24 ENCOUNTER — Encounter: Payer: Self-pay | Admitting: Pharmacist

## 2022-03-24 NOTE — Telephone Encounter (Signed)
Saxenda denied. Drug is excluded from benefits. Called pt and LVM for him to call back.

## 2022-04-04 ENCOUNTER — Encounter: Payer: Self-pay | Admitting: Internal Medicine

## 2022-04-04 ENCOUNTER — Ambulatory Visit (AMBULATORY_SURGERY_CENTER): Payer: Self-pay | Admitting: *Deleted

## 2022-04-04 VITALS — Ht 74.0 in | Wt 298.0 lb

## 2022-04-04 DIAGNOSIS — Z86010 Personal history of colonic polyps: Secondary | ICD-10-CM

## 2022-04-04 MED ORDER — NA SULFATE-K SULFATE-MG SULF 17.5-3.13-1.6 GM/177ML PO SOLN: 2.0000 | Freq: Once | ORAL | 0 refills | Status: AC

## 2022-04-04 NOTE — Progress Notes (Signed)
No egg or soy allergy known to patient  No issues known to pt with past sedation with any surgeries or procedures Patient denies ever being told they had issues or difficulty with intubation  No FH of Malignant Hyperthermia Pt is not on diet pills Pt is not on  home 02  Pt is not on blood thinners  Pt denies issues with constipation  No A fib or A flutter   Discussed with pt there will be an out-of-pocket cost for prep and that varies from $0 to 70 +  dollars - pt verbalized understanding  Pt instructed to use Singlecare.com or GoodRx for a price reduction on prep   PV completed in person. Pt verified name, DOB.  Procedure explained to pt. Prep instructions reviewed, questions answered. Pt encouraged to call with questions or issues.  If pt has My chart, procedure instructions sent via My Chart

## 2022-04-25 ENCOUNTER — Encounter: Payer: Self-pay | Admitting: Gastroenterology

## 2022-05-02 ENCOUNTER — Encounter: Payer: Self-pay | Admitting: Gastroenterology

## 2022-05-02 ENCOUNTER — Ambulatory Visit (AMBULATORY_SURGERY_CENTER): Payer: 59 | Admitting: Gastroenterology

## 2022-05-02 VITALS — BP 147/86 | HR 69 | Temp 99.3°F | Resp 10 | Ht 73.0 in | Wt 298.0 lb

## 2022-05-02 DIAGNOSIS — Z09 Encounter for follow-up examination after completed treatment for conditions other than malignant neoplasm: Secondary | ICD-10-CM | POA: Diagnosis not present

## 2022-05-02 DIAGNOSIS — K5100 Ulcerative (chronic) pancolitis without complications: Secondary | ICD-10-CM | POA: Diagnosis not present

## 2022-05-02 DIAGNOSIS — Z1211 Encounter for screening for malignant neoplasm of colon: Secondary | ICD-10-CM | POA: Diagnosis not present

## 2022-05-02 DIAGNOSIS — K6389 Other specified diseases of intestine: Secondary | ICD-10-CM | POA: Diagnosis not present

## 2022-05-02 DIAGNOSIS — Z86010 Personal history of colonic polyps: Secondary | ICD-10-CM

## 2022-05-02 MED ORDER — SODIUM CHLORIDE 0.9 % IV SOLN: 500.0000 mL | Freq: Once | INTRAVENOUS | Status: DC

## 2022-05-02 NOTE — Op Note (Addendum)
[name] Procedure Date: 05/02/2022 9:17 AM MRN: [id number] Endoscopist: Ladene Artist , MD Age: 66 Referring MD:  Date of Birth: 12/06/1955 Gender: Male Account #: [id number] Procedure:                Colonoscopy Indications:              High risk colon cancer surveillance: Ulcerative                            pancolitis of 8 (or more) years duration. Personal                            history of sessile serrated polyps Medicines:                Monitored Anesthesia Care Procedure:                Pre-Anesthesia Assessment:                           - Prior to the procedure, a History and Physical                            was performed, and patient medications and                            allergies were reviewed. The patient's tolerance of                            previous anesthesia was also reviewed. The risks                            and benefits of the procedure and the sedation                            options and risks were discussed with the patient.                            All questions were answered, and informed consent                            was obtained. Prior Anticoagulants: The patient has                            taken no previous anticoagulant or antiplatelet                            agents. ASA Grade Assessment: III - A patient with                            severe systemic disease. After reviewing the risks                            and benefits, the patient was deemed in  satisfactory condition to undergo the procedure.                           After obtaining informed consent, the colonoscope                            was passed under direct vision. Throughout the                            procedure, the patient's blood pressure, pulse, and                            oxygen saturations were monitored continuously. The                            CF [id number] #[id number] was  introduced through the anus                            and advanced to the the cecum, identified by                            appendiceal orifice and ileocecal valve. The                            ileocecal valve, appendiceal orifice, and rectum                            were photographed. The quality of the bowel                            preparation was adequate. The colonoscopy was                            performed without difficulty. The patient tolerated                            the procedure well. Scope In: 9:34:29 AM Scope Out: 0:98:11 AM Scope Withdrawal Time: 0 hours 10 minutes 44 seconds  Total Procedure Duration: 0 hours 14 minutes 42 seconds  Findings:                 The perianal and digital rectal examinations were                            normal.                           Patchy areas of mildly erythematous mucosa were                            found in the sigmoid colon and in the descending                            colon. Biopsies were taken with a cold forceps for  histology.                           Patchy pseudopolyps were found in the sigmoid                            colon, in the descending colon and in the                            transverse colon.                           Internal hemorrhoids were found during                            retroflexion. The hemorrhoids were moderate and                            Grade I (internal hemorrhoids that do not prolapse).                           The exam was otherwise without abnormality on                            direct and retroflexion views. Random biopsies were                            take with a cold forceps for histology. Complications:            No immediate complications. Estimated blood loss:                            None. Estimated Blood Loss:     Estimated blood loss: none. Impression:               - Erythematous mucosa in the sigmoid colon and in                             the descending colon. Biopsied.                           - Pseudopolyps in the sigmoid colon, in the                            descending colon and in the transverse colon.                           - Internal hemorrhoids.                           - The examination was otherwise normal on direct                            and retroflexion views. Biopsied. Recommendation:           - Repeat colonoscopy date to be determined, likely  3 year, after pending pathology results are                            reviewed for surveillance based on pathology                            results.                           - Patient has a contact number available for                            emergencies. The signs and symptoms of potential                            delayed complications were discussed with the                            patient. Return to normal activities tomorrow.                            Written discharge instructions were provided to the                            patient.                           - Resume previous diet.                           - Continue present medications.                           - Await pathology results. Ladene Artist, MD 05/02/2022 9:56:19 AM This report has been signed electronically.

## 2022-05-02 NOTE — Progress Notes (Signed)
Report to pacu rn. Vss. Care resumed by rn.

## 2022-05-02 NOTE — Progress Notes (Signed)
Called to room to assist during endoscopic procedure.  Patient ID and intended procedure confirmed with present staff. Received instructions for my participation in the procedure from the performing physician.  

## 2022-05-02 NOTE — Patient Instructions (Signed)
Please read handouts provided. Continue present medications. Await pathology results.   YOU HAD AN ENDOSCOPIC PROCEDURE TODAY AT [facility]:   Refer to the procedure report that was given to you for any specific questions about what was found during the examination.  If the procedure report does not answer your questions, please call your gastroenterologist to clarify.  If you requested that your care partner not be given the details of your procedure findings, then the procedure report has been included in a sealed envelope for you to review at your convenience later.  YOU SHOULD EXPECT: Some feelings of bloating in the abdomen. Passage of more gas than usual.  Walking can help get rid of the air that was put into your GI tract during the procedure and reduce the bloating. If you had a lower endoscopy (such as a colonoscopy or flexible sigmoidoscopy) you may notice spotting of blood in your stool or on the toilet paper. If you underwent a bowel prep for your procedure, you may not have a normal bowel movement for a few days.  Please Note:  You might notice some irritation and congestion in your nose or some drainage.  This is from the oxygen used during your procedure.  There is no need for concern and it should clear up in a day or so.  SYMPTOMS TO REPORT IMMEDIATELY:  Following lower endoscopy (colonoscopy or flexible sigmoidoscopy):  Excessive amounts of blood in the stool  Significant tenderness or worsening of abdominal pains  Swelling of the abdomen that is new, acute  Fever of 100F or higher  For urgent or emergent issues, a gastroenterologist can be reached at any hour by calling [phone]. Do not use MyChart messaging for urgent concerns.    DIET:  We do recommend a small meal at first, but then you may proceed to your regular diet.  Drink plenty of fluids but you should avoid alcoholic beverages for 24 hours.  ACTIVITY:  You should plan to take it easy for  the rest of today and you should NOT DRIVE or use heavy machinery until tomorrow (because of the sedation medicines used during the test).    FOLLOW UP: Our staff will call the number listed on your records the next business day following your procedure.  We will call around 7:15- 8:00 am to check on you and address any questions or concerns that you may have regarding the information given to you following your procedure. If we do not reach you, we will leave a message.  If you develop any symptoms (ie: fever, flu-like symptoms, shortness of breath, cough etc.) before then, please call [phone].  If you test positive for Covid 19 in the 2 weeks post procedure, please call and report this information to [place].    If any biopsies were taken you will be contacted by phone or by letter within the next 1-3 weeks.  Please call us at [phone] if you have not heard about the biopsies in 3 weeks.    SIGNATURES/CONFIDENTIALITY: You and/or your care partner have signed paperwork which will be entered into your electronic medical record.  These signatures attest to the fact that that the information above on your After Visit Summary has been reviewed and is understood.  Full responsibility of the confidentiality of this discharge information lies with you and/or your care-partner.

## 2022-05-02 NOTE — Progress Notes (Signed)
History & Physical  Primary Care Physician:  [name], MD Primary Gastroenterologist: Lucio Edward, MD  CHIEF COMPLAINT: Ulcerative colitis, Personal history of colon polyps   HPI: Richard [name] is a 66 y.o. male with a personal history of ulcerative colitis, personal history of adenomatous and sessile serrated colon polyps for colonoscopy.   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) 10/07/2015   with GAD    SLEEP APNEA 12/18/2008   wears CPAP   Sleep apnea    wears cpap nightly   Tubular adenoma of colon     Past Surgical History:  Procedure Laterality Date   COLONOSCOPY  02/2017   hx polyps& IC   NASAL SINUS SURGERY     POLYPECTOMY     colon polyps   WISDOM TOOTH EXTRACTION      Prior to Admission medications   Medication Sig Start Date End Date Taking? Authorizing Provider  amLODipine-benazepril (LOTREL) 10-40 MG capsule TAKE 1 CAPSULE BY MOUTH EVERY DAY 03/13/22  Yes [name], MD  aspirin EC 81 MG tablet Take 1 tablet (81 mg total) by mouth daily. Swallow whole. 01/09/22  Yes Early Osmond, MD  finasteride (PROPECIA) 1 MG tablet TAKE 1 TABLET BY MOUTH EVERY DAY 09/22/21  Yes [name], MD  metoprolol succinate (TOPROL XL) 25 MG 24 hr tablet Take 0.5 tablets (12.5 mg total) by mouth at bedtime. 01/09/22  Yes Early Osmond, MD  Multiple Vitamins-Iron (MULTIVITAMIN/IRON) TABS Take 1 tablet by mouth daily.   Yes [provider]  NASONEX 50 MCG/ACT nasal spray PLACE 2 SQUIRTS IN EACH NOSTRIL DAILY 12/19/11  Yes Ricard Dillon, MD  rosuvastatin (CRESTOR) 40 MG tablet Take 1 tablet (40 mg total) by mouth daily. 01/09/22  Yes Early Osmond, MD  sulfaSALAzine (AZULFIDINE) 500 MG tablet TAKE 3 TABLETS BY MOUTH 2 TIMES A DAY. 01/09/22  Yes [name], MD  traZODone (DESYREL) 50 MG tablet TAKE 0.5-1 TABLETS (25-50 MG TOTAL) BY MOUTH AT BEDTIME AS NEEDED FOR SLEEP. 05/20/19  Yes [name], MD  TESTOSTERONE ENANTHATE IJ Inject as directed.    [provider]  VIT B12-METHIONINE-INOS-CHOL IM Inject into the muscle.    [provider]    Current Outpatient Medications  Medication Sig Dispense Refill   amLODipine-benazepril (LOTREL) 10-40 MG capsule TAKE 1 CAPSULE BY MOUTH EVERY DAY 30 capsule 5   aspirin EC 81 MG tablet Take 1 tablet (81 mg total) by mouth daily. Swallow whole. 90 tablet 3   finasteride (PROPECIA) 1 MG tablet TAKE 1 TABLET BY MOUTH EVERY DAY 90 tablet 3   metoprolol succinate (TOPROL XL) 25 MG 24 hr tablet Take 0.5 tablets (12.5 mg total) by mouth at bedtime. 45 tablet 3   Multiple Vitamins-Iron (MULTIVITAMIN/IRON) TABS Take 1 tablet by mouth daily.     NASONEX 50 MCG/ACT nasal spray PLACE 2 SQUIRTS IN EACH NOSTRIL DAILY 17 g 11   rosuvastatin (CRESTOR) 40 MG tablet Take 1 tablet (40 mg total) by mouth  daily. 90 tablet 3   sulfaSALAzine (AZULFIDINE) 500 MG tablet TAKE 3 TABLETS BY MOUTH 2 TIMES A DAY. 540 tablet 3   traZODone (DESYREL) 50 MG tablet TAKE 0.5-1 TABLETS (25-50 MG TOTAL) BY MOUTH AT BEDTIME AS NEEDED FOR SLEEP. 90 tablet 2   TESTOSTERONE ENANTHATE IJ Inject as directed.     VIT B12-METHIONINE-INOS-CHOL IM Inject into the muscle.     Current Facility-Administered Medications  Medication Dose Route Frequency Provider Last Rate Last Admin   0.9 %  sodium chloride infusion  500 mL Intravenous Once Ladene Artist, MD        Allergies as of 05/02/2022   (No Known Allergies)    Family History  Problem Relation Age of Onset   Hyperlipidemia Father     Hypertension Father    Heart disease Father    Breast cancer Mother        <50   Ulcerative colitis Cousin        2 cousins paternal side   Colon polyps Cousin    Colon cancer Neg Hx    Esophageal cancer Neg Hx    Stomach cancer Neg Hx    Rectal cancer Neg Hx    Prostate cancer Neg Hx     Social History   Socioeconomic History   Marital status: Single    Spouse name: Not on file   Number of children: 0   Years of education: Not on file   Highest education level: Not on file  Occupational History   Occupation: Architectural technologist: [employer]  Tobacco Use   Smoking status: Former    Years: 20.00    Types: Cigarettes    Quit date: 10/21/1999    Years since quitting: 22.5   Smokeless tobacco: Never  Vaping Use   Vaping Use: Never used  Substance and Sexual Activity   Alcohol use: Yes    Alcohol/week: 5.0 - 7.0 standard drinks of alcohol    Types: 5 - 7 Standard drinks or equivalent per week    Comment: 1 drink per day at most   Drug use: No   Sexual activity: Not on file  Other Topics Concern   Not on file  Social History Narrative   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   Physical Exam: General:  Alert, well-developed, in NAD Head:  Normocephalic and atraumatic. Eyes:  Sclera clear, no icterus.   Conjunctiva pink. Ears:  Normal auditory acuity. Mouth:  No deformity or lesions.  Neck:  Supple; no masses . Lungs:  Clear throughout to auscultation.   No wheezes, crackles, or rhonchi. No acute  distress. Heart:  Regular rate and rhythm; no murmurs. Abdomen:  Soft, nondistended, nontender. No masses, hepatomegaly. No obvious masses.  Normal bowel .    Rectal:  Deferred   Msk:  Symmetrical without gross deformities.. Pulses:  Normal pulses noted. Extremities:  Without edema. Neurologic:  Alert and  oriented x4;  grossly normal neurologically. Skin:  Intact without significant lesions or rashes. Cervical Nodes:  No significant cervical adenopathy. Psych:  Alert and cooperative. Normal mood and affect.  Impression / Plan:   Personal history of ulcerative colitis, personal history of adenomatous and sessile serrated colon polyps for colonoscopy.  [name]. Fuller Plan  05/02/2022, 9:24 AM See Shea Evans, [facility], to contact our on call provider

## 2022-05-02 NOTE — Progress Notes (Signed)
Pt's states no medical or surgical changes since previsit or office visit. 

## 2022-05-03 ENCOUNTER — Telehealth: Payer: Self-pay

## 2022-05-03 NOTE — Telephone Encounter (Signed)
  Follow up Call-     05/02/2022    8:14 AM  Call back number  Post procedure Call Back phone  # [phone]  Permission to leave phone message Yes     Patient questions:  Do you have a fever, pain , or abdominal swelling? No. Pain Score  0 *  Have you tolerated food without any problems? Yes.    Have you been able to return to your normal activities? Yes.    Do you have any questions about your discharge instructions: Diet   No. Medications  No. Follow up visit  No.  Do you have questions or concerns about your Care? No.  Actions: * If pain score is 4 or above: No action needed, pain <4.

## 2022-05-09 ENCOUNTER — Encounter: Payer: Self-pay | Admitting: Gastroenterology

## 2022-05-29 ENCOUNTER — Encounter: Payer: Self-pay | Admitting: *Deleted

## 2022-07-25 ENCOUNTER — Encounter: Payer: Self-pay | Admitting: Family Medicine

## 2022-07-25 ENCOUNTER — Ambulatory Visit: Payer: 59 | Admitting: Family Medicine

## 2022-07-25 VITALS — BP 148/81 | HR 70 | Temp 97.7°F | Ht 73.0 in | Wt 307.6 lb

## 2022-07-25 DIAGNOSIS — F909 Attention-deficit hyperactivity disorder, unspecified type: Secondary | ICD-10-CM | POA: Diagnosis not present

## 2022-07-25 DIAGNOSIS — I10 Essential (primary) hypertension: Secondary | ICD-10-CM

## 2022-07-25 DIAGNOSIS — R69 Illness, unspecified: Secondary | ICD-10-CM | POA: Diagnosis not present

## 2022-07-25 HISTORY — DX: Attention-deficit hyperactivity disorder, unspecified type: F90.9

## 2022-07-25 MED ORDER — METOPROLOL SUCCINATE ER 25 MG PO TB24: 25.0000 mg | ORAL_TABLET | Freq: Every day | ORAL | 3 refills | Status: DC

## 2022-07-25 NOTE — Patient Instructions (Signed)
It was very nice to see you today!  Please encourage your metoprolol to 25 mg nightly.  Please work on cutting down on your sodium.  Please send me a message in a few weeks to let me know how your blood pressure is doing.  Take care, Dr [name]  PLEASE NOTE:  If you had any lab tests please let us know if you have not heard back within a few days. You may see your results on mychart before we have a chance to review them but we will give you a call once they are reviewed by [place]. If we ordered any referrals today, please let us know if you have not heard from their office within the next week.   Please try these tips to maintain a healthy lifestyle:  Eat at least 3 REAL meals and 1-2 snacks per day.  Aim for no more than 5 hours between eating.  If you eat breakfast, please do so within one hour of getting up.   Each meal should contain half fruits/vegetables, one quarter protein, and one quarter carbs (no bigger than a computer mouse)  Cut down on sweet beverages. This includes juice, soda, and sweet tea.   Drink at least 1 glass of water with each meal and aim for at least 8 glasses per day  Exercise at least 150 minutes every week.

## 2022-07-25 NOTE — Assessment & Plan Note (Signed)
Follows with psychiatry. °

## 2022-07-25 NOTE — Progress Notes (Signed)
   Richard [name] is a 66 y.o. male who presents today for an office visit.  Assessment/Plan:  Chronic Problems Addressed Today: Essential hypertension Elevated today and has had elevated readings at home for the last several weeks..  We will increase metoprolol to 50 mg daily.  We will continue his amlodipine-benazepril 10-40 once daily.  He will continue to monitor at home and check with [place] next few weeks via MyChart.  We will increase the dose of metoprolol as tolerated.  We discussed lifestyle modifications including salt avoidance and regular cardiovascular exercise.  ADHD Follows with psychiatry.     Subjective:  HPI:  See A/p for status of chronic conditions. His main concern today is elevated blood pressure reading.  This has been elevated for the last several weeks. Home readings have been in the 160s.  Sometimes in the 170s over 180s to 110's.  He was started on Adderall a few months ago from psychiatry however has been off this for the last few weeks and blood pressure still been elevated.  No obvious precipitating causes for increased in blood pressure.       Objective:  Physical Exam: BP (!) 148/81   Pulse 70   Temp 97.7 F (36.5 C) (Temporal)   Ht 6' 1"  (1.854 m)   Wt (!) 307 lb 9.6 oz (139.5 kg)   SpO2 95%   BMI 40.58 kg/m   Gen: No acute distress, resting comfortably CV: Regular rate and rhythm with no murmurs appreciated Pulm: Normal work of breathing, clear to auscultation bilaterally with no crackles, wheezes, or rhonchi Neuro: Grossly normal, moves all extremities Psych: Normal affect and thought content      Cicilia Clinger M. [name], MD 07/25/2022 12:07 PM

## 2022-07-25 NOTE — Assessment & Plan Note (Addendum)
Elevated today and has had elevated readings at home for the last several weeks..  We will increase metoprolol to 50 mg daily.  We will continue his amlodipine-benazepril 10-40 once daily.  He will continue to monitor at home and check with [place] next few weeks via MyChart.  We will increase the dose of metoprolol as tolerated.  We discussed lifestyle modifications including salt avoidance and regular cardiovascular exercise.

## 2022-08-15 ENCOUNTER — Other Ambulatory Visit: Payer: Self-pay | Admitting: Family Medicine

## 2022-08-17 ENCOUNTER — Encounter: Payer: Self-pay | Admitting: *Deleted

## 2022-08-17 DIAGNOSIS — H401122 Primary open-angle glaucoma, left eye, moderate stage: Secondary | ICD-10-CM | POA: Diagnosis not present

## 2022-09-13 ENCOUNTER — Encounter: Payer: Self-pay | Admitting: Family Medicine

## 2022-09-13 NOTE — Telephone Encounter (Signed)
Spoke with patient stated had Cold Sx for 5 days Negative Covid home test  Stated still with fatigue  Advise to schedule OV for assessment

## 2022-09-14 ENCOUNTER — Encounter: Payer: Self-pay | Admitting: Internal Medicine

## 2022-09-14 ENCOUNTER — Ambulatory Visit (INDEPENDENT_AMBULATORY_CARE_PROVIDER_SITE_OTHER): Payer: 59 | Admitting: Internal Medicine

## 2022-09-14 VITALS — BP 133/68 | HR 86 | Temp 98.2°F | Ht 73.0 in | Wt 301.4 lb

## 2022-09-14 DIAGNOSIS — J320 Chronic maxillary sinusitis: Secondary | ICD-10-CM

## 2022-09-14 DIAGNOSIS — J189 Pneumonia, unspecified organism: Secondary | ICD-10-CM

## 2022-09-14 MED ORDER — AMOXICILLIN-POT CLAVULANATE 875-125 MG PO TABS: 1.0000 | ORAL_TABLET | Freq: Two times a day (BID) | ORAL | 0 refills | Status: DC

## 2022-09-14 NOTE — Progress Notes (Signed)
[name]: [phone]   Routine Medical Office Visit  Patient:  Richard [name]      Age: 67 y.o.       Sex:  male  Date:   09/14/2022  PCP:    [name], [place] Provider: Loralee Pacas, MD   Problem Focused Charting:   Medical Decision Making per Assessment/Plan   Obryan was seen today for chest congestion, wheezing, cough, fatigue and covid test negative.  Chronic maxillary sinusitis -     Amoxicillin-Pot Clavulanate; Take 1 tablet by mouth 2 (two) times daily.  Dispense: 20 tablet; Refill: 0  Pneumonia of right lower lobe due to infectious organism  Offered XR for suspect pneumonia but declined since antibiotic(s) for chronic sinusitis likely will cover.  Focused importance of sinus rinsing and put info about it in AVS and discussed importance of nasal saline rinses.  Qualifies for antibiotic(s) due to duration of symptom(s) over 10 days, suspected pneumonia, finding of purulence in right nostril  Today's key discussion points and After Visit Summary (AVS) reminders. Common side effects, risks, benefits, and alternatives for medications and treatment plan prescribed today were discussed We discussed red flag symptoms and signs in detail and when to call the office or go to ER if his condition worsens (see AVS). He expressed understanding and AVS is used to reinforce.  This entire medical encounter document is also available on MyChart for him to review for accuracy and understanding.  Review of this document is encouraged in the AVS.    Subjective - Clinical Presentation:   Richard [name] is a 67 y.o. 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   Sleep apnea 12/18/2008   Hyperlipemia  05/28/2007   Essential hypertension 05/28/2007   Ulcerative colitis ([place]) 05/28/2007   Past Medical History:  Diagnosis Date   ALLERGIC RHINITIS CAUSE UNSPECIFIED 05/21/2009   Allergy    BACK PAIN, LUMBAR, WITH RADICULOPATHY 03/18/2010   COLITIS, ULCERATIVE 05/28/2007   Glaucoma    2 yrs ago eye md told does NOT have glaucoma, pt denies this dx    HYPERLIPIDEMIA 05/28/2007   HYPERTENSION 05/28/2007   HYPOGONADISM, MALE 07/24/2008   Internal hemorrhoids    MALE PATTERN BALDNESS 05/28/2007   Recurrent major depressive disorder, in full remission ([place]) 10/07/2015   with GAD    SLEEP APNEA 12/18/2008   wears CPAP   Sleep apnea    wears cpap nightly   Tubular adenoma of colon     Outpatient Medications Prior to Visit  Medication Sig   amLODipine-benazepril (LOTREL) 10-40 MG capsule TAKE 1 CAPSULE BY MOUTH EVERY DAY   amphetamine-dextroamphetamine (ADDERALL XR) 10 MG 24 hr capsule Take 10 mg by mouth every morning. Not taken regularly-makes him feel nervous and jittery.   anastrozole (ARIMIDEX) 1 MG tablet Take by mouth.   aspirin EC 81 MG tablet Take 1 tablet (81 mg total) by mouth daily. Swallow whole.   finasteride (PROPECIA) 1 MG tablet TAKE 1 TABLET BY MOUTH EVERY DAY   Multiple Vitamins-Iron (MULTIVITAMIN/IRON) TABS Take 1 tablet by mouth daily.   NASONEX 50 MCG/ACT nasal spray PLACE 2 SQUIRTS IN EACH NOSTRIL DAILY   rosuvastatin (CRESTOR) 40 MG tablet Take 1 tablet (40 mg  total) by mouth daily.   sulfaSALAzine (AZULFIDINE) 500 MG tablet TAKE 3 TABLETS BY MOUTH 2 TIMES A DAY.   TESTOSTERONE ENANTHATE IJ Inject as directed.   traZODone (DESYREL) 50 MG tablet TAKE 0.5-1 TABLETS (25-50 MG TOTAL) BY MOUTH AT BEDTIME AS NEEDED FOR SLEEP.   metoprolol succinate (TOPROL XL) 25 MG 24 hr tablet Take 1 tablet (25 mg total) by mouth at bedtime. (Patient not taking: Reported on 09/14/2022)   No facility-administered medications prior to visit.    Chief Complaint  Patient presents with   Chest  congestion   Wheezing   Cough    Since 12/30, worse 1/3. No fever.   Fatigue   Covid test negative    At home on 12/31.    HPI  Bad flu and cough chest>nasal congestion fatigue 09/02/22 Saturday hit him hard was initial symptoms and really bad 3-4 days Has taken vitamin C, mucinex, tylenol Compliant with CPAP for OSA Always had bad allergies. Mild sore throat for  a day in the middle of it Had some abdomen aching from coughing but better now, and coughing also somewhat better First prod cough was clear, then green then really dark.  Now its mostly clear Patient denies recent fever Patient denies shortness of breath "well maybe a little bit, don't feel like I get a good breath in"           Objective:  Physical Exam  BP 133/68 (BP Location: Right Arm, Patient Position: Sitting)   Pulse 86   Temp 98.2 F (36.8 C) (Temporal)   Ht '6\' 1"'$  (1.854 m)   Wt (!) 301 lb 6.4 oz (136.7 kg)   SpO2 95%   BMI 39.76 kg/m  Obese  by BMI criteria but truncal adiposity (waist circumference or caliper) should be used instead. Wt Readings from Last 10 Encounters:  09/14/22 (!) 301 lb 6.4 oz (136.7 kg)  07/25/22 (!) 307 lb 9.6 oz (139.5 kg)  05/02/22 298 lb (135.2 kg)  04/04/22 298 lb (135.2 kg)  03/21/22 (!) 302 lb (137 kg)  01/09/22 297 lb (134.7 kg)  11/21/21 294 lb 6.4 oz (133.5 kg)  12/24/20 292 lb (132.5 kg)  11/25/20 291 lb 9.6 oz (132.3 kg)  11/18/20 293 lb (132.9 kg)   Vital signs reviewed.  Nursing notes reviewed. Weight trend reviewed. General Appearance:  Well developed, well nourished male in no acute distress.   Normal work of breathing at rest Musculoskeletal: All extremities are intact.  Neurological:  Awake, alert,  No obvious focal neurological deficits or cognitive impairments Psychiatric:  Appropriate mood, pleasant demeanor Problem-specific findings: He can do long sentences for about 10 seconds before catching his breath and there is no increased shortness of breath  obvious at rest he is seems to have normal work of breathing without acute distress lets see if there are he has thick nasal congestion on nasal exam and I actually saw a pile of pus in his right nostril up and there deep he also has a lot of erythema and swelling of the turbinates they look like it could also be allergic due to the pale boggy nature.  I hear crackles in R > L lower lung fields.      Signed: Loralee Pacas, MD 09/14/2022 12:39 PM

## 2022-09-29 ENCOUNTER — Other Ambulatory Visit: Payer: Self-pay | Admitting: Family Medicine

## 2022-10-19 ENCOUNTER — Encounter: Payer: Self-pay | Admitting: *Deleted

## 2022-10-19 DIAGNOSIS — Z006 Encounter for examination for normal comparison and control in clinical research program: Secondary | ICD-10-CM

## 2022-10-19 NOTE — Research (Signed)
Message left for Richard [name] about V1P research. Encouraged him to call with any questions.

## 2022-10-20 ENCOUNTER — Encounter: Payer: Self-pay | Admitting: *Deleted

## 2022-10-20 DIAGNOSIS — Z006 Encounter for examination for normal comparison and control in clinical research program: Secondary | ICD-10-CM

## 2022-10-20 NOTE — Research (Signed)
Richard [name] called states ok to send more information about V1P research. Emailed a copy of the consent for him to read over. Encouraged him to call with any questions.

## 2022-10-27 ENCOUNTER — Encounter: Payer: Self-pay | Admitting: *Deleted

## 2022-10-27 DIAGNOSIS — Z006 Encounter for examination for normal comparison and control in clinical research program: Secondary | ICD-10-CM

## 2022-10-27 NOTE — Research (Signed)
Mr Richard [name] called and states that he would like to scheduled an appointment to come in for screening for V1P. Scheduled for March 5 at 0900

## 2022-11-04 IMAGING — CT CT CARDIAC CORONARY ARTERY CALCIUM SCORE
3 series · 14 of 20 positions shown, 16 images · non-contrast
Comparison: None Available.
COMPARISON: None Available.

Addendum:
CLINICAL DATA: This over-read does not include interpretation of
cardiac or coronary anatomy or pathology. The coronary calcium score
interpretation by the cardiologist is attached.
CLINICAL DATA: Cardiovascular Disease Risk stratification

EXAM:
Coronary Calcium Score
TECHNIQUE: A gated, non-contrast computed tomography scan of the heart was
performed using 3mm slice thickness. Axial images were analyzed on a
dedicated workstation. Calcium scoring of the coronary arteries was
performed using the Agatston method.

[Series 2: cascseq 2.0 sa36 70% (id) · axial · 0.45mm/px · z∈[-300,-210]mm · 4 of 76 slices shown]
[im 16/76  vessel]
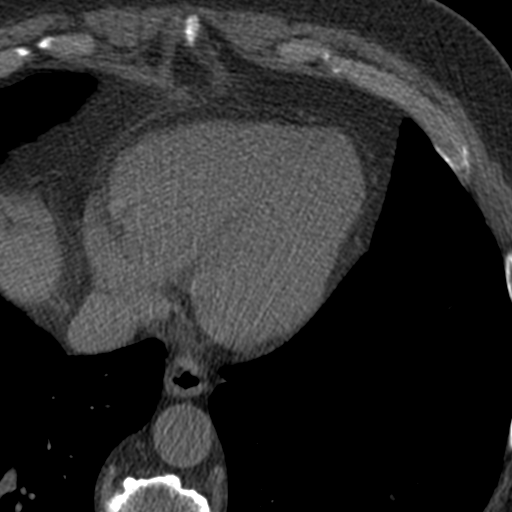
[im 31/76  vessel]
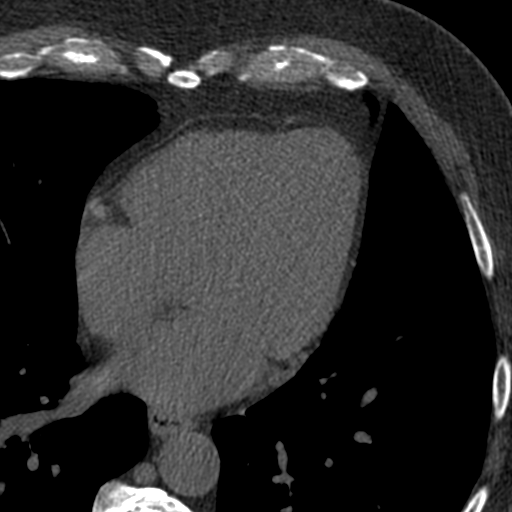
[im 46/76  vessel]
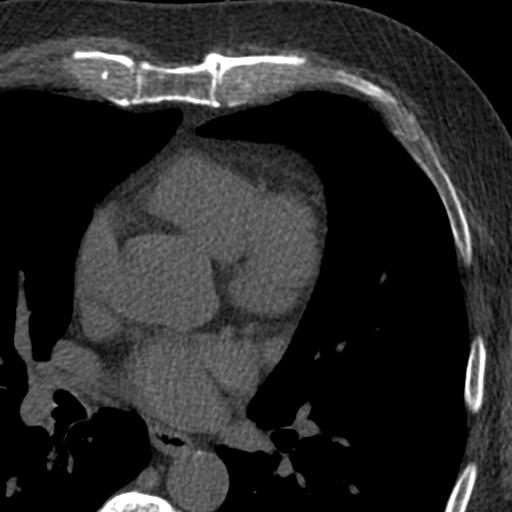
[im 61/76  vessel]
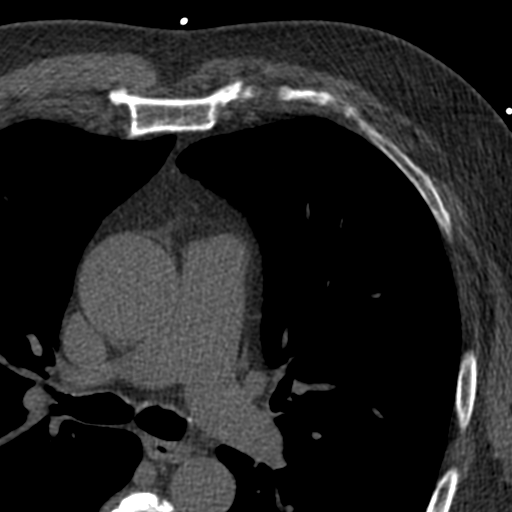

[Series 3: cascseq 2.0 bf37 st · axial · 0.77mm/px · z∈[-306,-206]mm · 5 of 76 slices shown, 7 images]
[im 13/76  vessel]
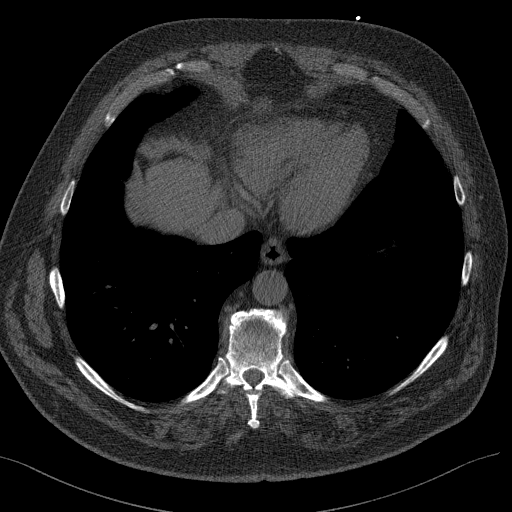
[im 13/76  lung]
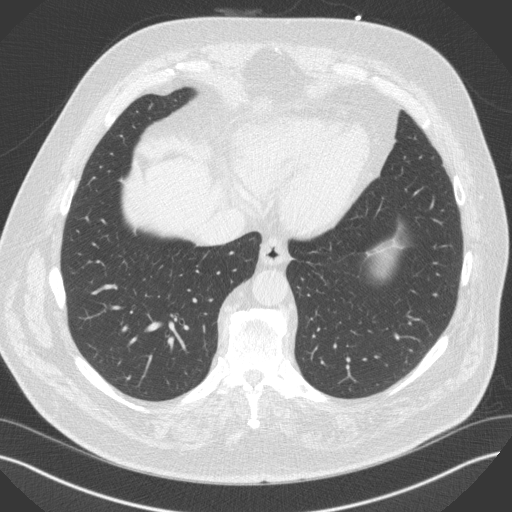
[im 26/76  vessel]
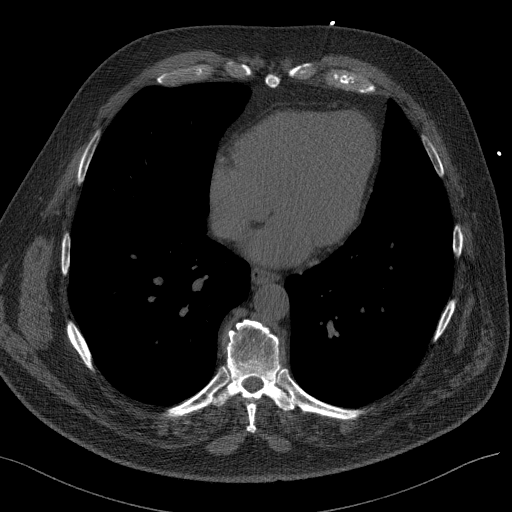
[im 38/76  vessel]
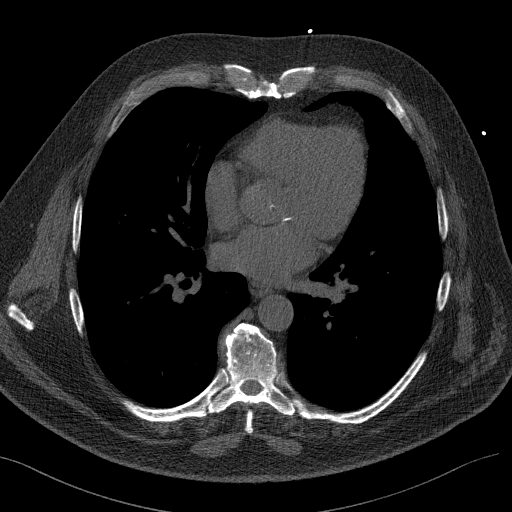
[im 51/76  vessel]
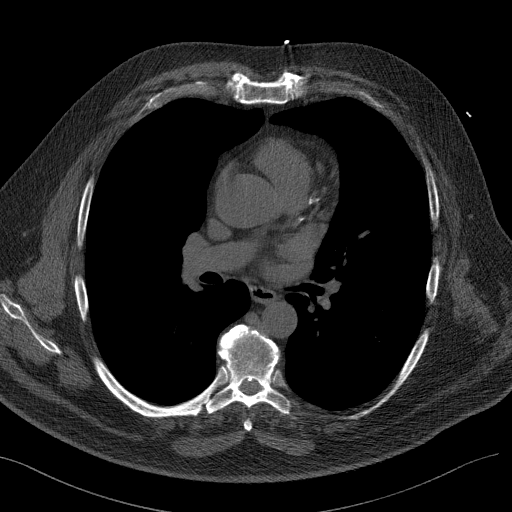
[im 63/76  vessel]
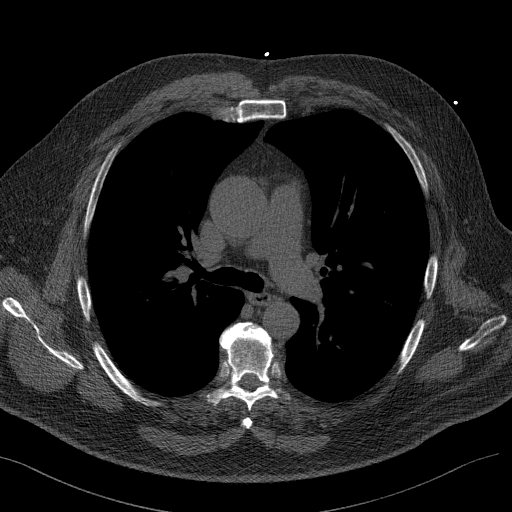
[im 63/76  lung]
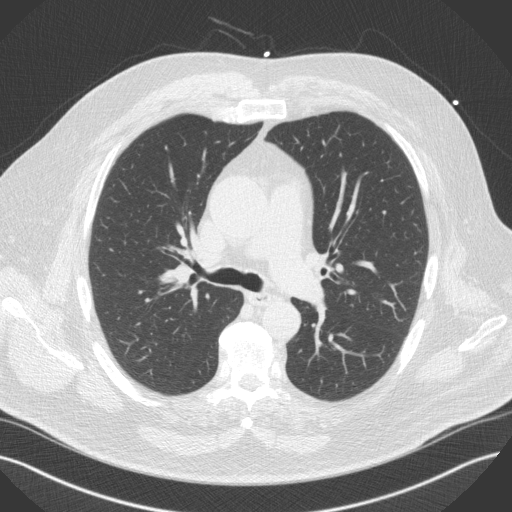

[Series 4: cascseq 2.0 br59 lung · axial · 0.77mm/px · z∈[-306,-206]mm · 5 of 76 slices shown]
[im 13/76  lung]
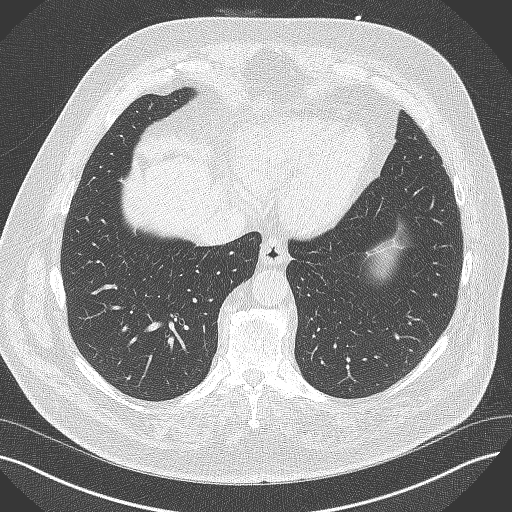
[im 26/76  lung]
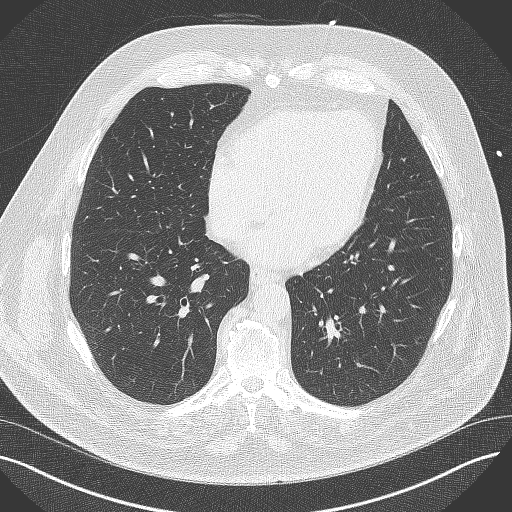
[im 38/76  lung]
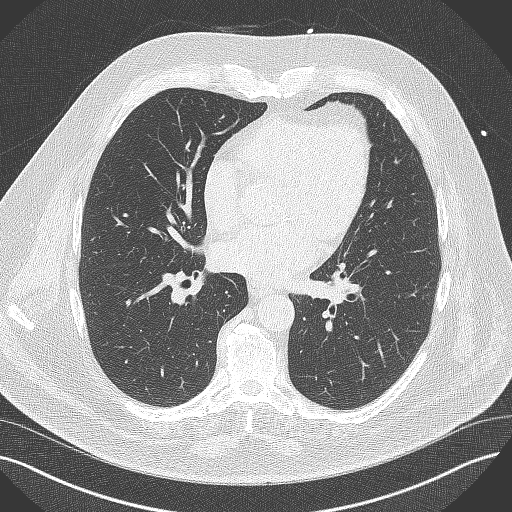
[im 51/76  lung]
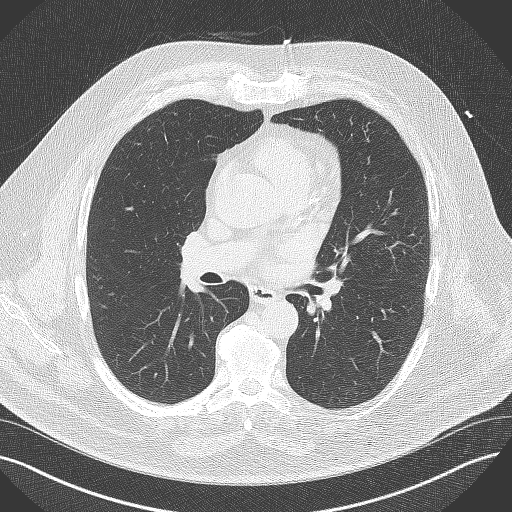
[im 63/76  lung]
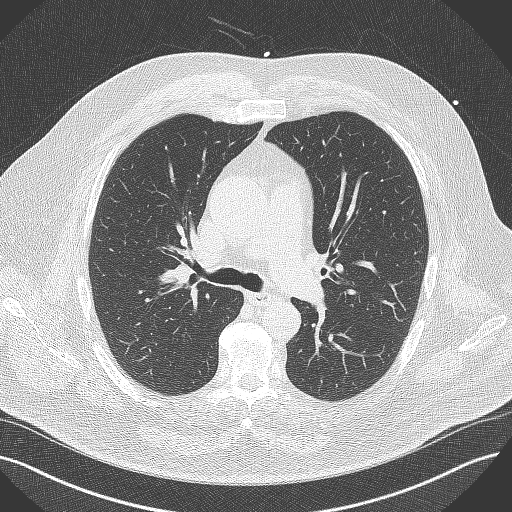

[14 of 20 positions shown; findings below may reference images not displayed]

FINDINGS: Vascular: Aortic arch atherosclerotic calcification. Ascending aorta
4.4 cm in diameter compatible with ascending thoracic aortic
aneurysm.

Mediastinum/Nodes: Unremarkable

Lungs/Pleura: Unremarkable

Upper Abdomen: Unremarkable where included.

Musculoskeletal: There is some relative atrophy of the left
pectoralis major and left serratus anterior muscles compared to the
right. Substantial thoracic spondylosis.
IMPRESSION: 1. Ascending aortic aneurysm 4.4 cm in diameter. Recommend annual
imaging followup by CTA or MRA. This recommendation follows 3333
ACCF/AHA/AATS/ACR/ASA/SCA/BENTHOLA/ROGERS/MARIA FERNANDA/JENNY CECILIA Guidelines for the
Diagnosis and Management of Patients with Thoracic Aortic Disease.
Circulation. 3333; 121: E266-e369. Aortic aneurysm NOS (268ZB-2XT.Q)
2. Incidental relative atrophy of the left pectoralis major left
serratus anterior muscle compared to the right, significance
uncertain.
3. Thoracic spondylosis.
4.  Aortic Atherosclerosis (268ZB-2XP.P).
FINDINGS: Coronary arteries: Normal origins.

Coronary Calcium Score:

Left main:

Left anterior descending artery: 292

Left circumflex artery:

Right coronary artery: 0

Total: 358

Percentile: 80th

Pericardium: Normal.

Ascending Aorta: 4.4 cm ascending aorta.

Non-cardiac: See separate report from [REDACTED].
IMPRESSION: Coronary calcium score of 358 Agatston units. This was 80th
percentile for age-, race-, and sex-matched controls.

Dilated ascending aorta, 4.4 cm.  Recommend yearly followup.



If CAC=0, it is reasonable to withhold statin therapy and reassess
in 5 to 10 years, as long as higher risk conditions are absent
(diabetes mellitus, family history of premature CHD in first degree
relatives (males <55 years; females <65 years), cigarette smoking,
or LDL >=190 mg/dL).

If CAC is 1 to 99, it is reasonable to initiate statin therapy for
patients >=55 years of age.

If CAC is >=100 or >=75th percentile, it is reasonable to initiate
statin therapy at any age.

Cardiology referral should be considered for patients with CAC
scores >=400 or >=75th percentile.

*1062 AHA/ACC/AACVPR/AAPA/ABC/GARABITO/BOODRAM/BUMMBLEBEE/Kappel/DOLEZSAL/CHUNG/AMNV
Guideline on the Management of Blood Cholesterol: A Report of the
American College of Cardiology/American Heart Association Task Force
on Clinical Practice Guidelines. J Am Coll Cardiol.
4989;73(24):4931-4236.

*** End of Addendum ***
FINDINGS: Vascular: Aortic arch atherosclerotic calcification. Ascending aorta
4.4 cm in diameter compatible with ascending thoracic aortic
aneurysm.

Mediastinum/Nodes: Unremarkable

Lungs/Pleura: Unremarkable

Upper Abdomen: Unremarkable where included.

Musculoskeletal: There is some relative atrophy of the left
pectoralis major and left serratus anterior muscles compared to the
right. Substantial thoracic spondylosis.
IMPRESSION: 1. Ascending aortic aneurysm 4.4 cm in diameter. Recommend annual
imaging followup by CTA or MRA. This recommendation follows 3333
ACCF/AHA/AATS/ACR/ASA/SCA/BENTHOLA/ROGERS/MARIA FERNANDA/JENNY CECILIA Guidelines for the
Diagnosis and Management of Patients with Thoracic Aortic Disease.
Circulation. 3333; 121: E266-e369. Aortic aneurysm NOS (268ZB-2XT.Q)
2. Incidental relative atrophy of the left pectoralis major left
serratus anterior muscle compared to the right, significance
uncertain.
3. Thoracic spondylosis.
4.  Aortic Atherosclerosis (268ZB-2XP.P).

## 2022-11-06 ENCOUNTER — Encounter: Payer: Self-pay | Admitting: *Deleted

## 2022-11-06 DIAGNOSIS — Z006 Encounter for examination for normal comparison and control in clinical research program: Secondary | ICD-10-CM

## 2022-11-06 NOTE — Research (Signed)
Email sent to remind Richard [name] of his appointment tomorrow at 0900. Reminded to be NPO, and gave him the parking code.

## 2022-11-07 ENCOUNTER — Other Ambulatory Visit: Payer: Self-pay

## 2022-11-07 ENCOUNTER — Encounter: Payer: 59 | Admitting: *Deleted

## 2022-11-07 VITALS — BP 151/74 | HR 81 | Temp 98.6°F | Resp 16 | Ht 73.0 in | Wt 306.6 lb

## 2022-11-07 DIAGNOSIS — Z006 Encounter for examination for normal comparison and control in clinical research program: Secondary | ICD-10-CM

## 2022-11-07 NOTE — Research (Signed)
V1P Informed Consent  Protocol No. [id number] ICF Version number: 01.01.03  Subject Name: Cylar Lapa  MRN: [id number] DOB: 05/01/1956   Subject met inclusion and exclusion criteria.  The informed consent form, study requirements and expectations were reviewed with the subject and questions and concerns were addressed prior to the signing of the consent form.  The subject verbalized understanding of the trial requirements.  The subject agreed to participate in the [facility] 1 Prevent trial and signed the informed consent on 07-November-2022 at [place] consent was obtained prior to performance of any protocol-specific procedures. A copy of the signed informed consent was given to the subject and original copy was placed in the subject's medical record.      Subject ID:    Date: Male '[x]'$    Male '[]'$  Age:67 Ethnicity: Hispanic/Latino '[]'$   Non-Hispanic/Latino '[x]'$  Race: White '[x]'$  Black or African American '[]'$  Asian '[]'$  American [place] or [place] Native '[]'$  Native Hawaiian or Other [place] '[]'$  Other '[]'$       VP1 Screening Visit  Subject number: [id number] Informed consent signed '[x]'$   ICF signed on 07-November-2022 at 0915  Inclusion and exclusion criteria reviewed '[x]'$  Patient demography reviewed '[x]'$  Male '[x]'$    Male Age: 67    Vitals:   11/07/22 0922 11/07/22 0925 11/07/22 0927 11/07/22 1011  BP: (!) 151/74 (!) 152/70 (!) 150/78 (!) 151/74  Pulse: 81 81 84 81  Temp: 98.6 F (37 C)   98.6 F (37 C)  Resp: 16   16  Height: '6\' 1"'$  (1.854 m)   '6\' 1"'$  (1.854 m)  Weight: (!) 306 lb 9.6 oz (139.1 kg)   (!) 306 lb 9.6 oz (139.1 kg)  SpO2: 96%   96%  TempSrc: Temporal   Temporal  BMI (Calculated): 40.46   40.46     Past and current medical history reviewed '[x]'$  Surgical history reviewed '[x]'$  Prior or concomitant medications reviewed '[x]'$  Lipid lowering medications reviewed '[x]'$   Physical Exam performed by PI or SUB-I '[x]'$  LR     Waist circumference: 131 cm    Mean BP: 151/74    Mean Pulse: 82  Labs collected at: 0935 include: Serum pregnancy '[]'$  Fasting Lipid Profile '[x]'$  Broad Clinical Chemistry '[x]'$  Hematology panel '[x]'$  Urinalysis '[x]'$   Genetic Consent obtained '[x]'$   AE/SAE assessment completed '[x]'$    Mr Govoni is here for Screening visit for V1P.

## 2022-11-07 NOTE — Progress Notes (Signed)
Patient was seen today as a part of V1P research study. Overall has been doing well. No complaints.   General: Well developed, well nourished, male appearing in no acute distress. Head: Normocephalic, atraumatic.  Neck: Supple without bruits, JVD. Lungs:  Resp regular and unlabored, CTA. Heart: RRR, S1, S2, no S3, S4, or murmur; no rub. Abdomen: Soft, non-tender, non-distended. Extremities: No clubbing, cyanosis, edema. Distal pedal pulses are 2+ bilaterally. L Neuro: Alert and oriented X 3. Moves all extremities spontaneously. Psych: Normal affect.   Barnet Pall, NP-C 11/07/2022, 9:30 AM Pager: [phone]

## 2022-11-10 NOTE — Research (Cosign Needed)
Are there any labs that are clinically significant?  Yes '[x]'$  OR No'[]'$  CK 940, needs to be rechecked  Is the patient eligible to continue enrollment in the study after screening visit?  Yes '[x]'$   OR No'[]'$   Abnormal labs CK is 940

## 2022-11-16 ENCOUNTER — Encounter: Payer: Self-pay | Admitting: *Deleted

## 2022-11-16 DIAGNOSIS — Z006 Encounter for examination for normal comparison and control in clinical research program: Secondary | ICD-10-CM

## 2022-11-16 NOTE — Research (Signed)
11/16/2022 LMTCB to come in and have labs rechecked due to elevated CK level drawn at last visit.

## 2022-11-17 ENCOUNTER — Encounter: Payer: Self-pay | Admitting: *Deleted

## 2022-11-17 DIAGNOSIS — Z006 Encounter for examination for normal comparison and control in clinical research program: Secondary | ICD-10-CM

## 2022-11-17 NOTE — Research (Signed)
Spoke with Richard [name] about coming in to have his CK rechecked. Scheduled him to come in Monday March 18 at 1000 for Blood draw.

## 2022-11-20 ENCOUNTER — Encounter: Payer: Self-pay | Admitting: *Deleted

## 2022-11-20 DIAGNOSIS — Z006 Encounter for examination for normal comparison and control in clinical research program: Secondary | ICD-10-CM

## 2022-11-20 NOTE — Research (Signed)
Email from Dr Dennard Schaumann that he needs to reschedule until tomorrow. Scheduled for 1000 for blood draw with research to check CK

## 2022-11-21 ENCOUNTER — Encounter: Payer: 59 | Admitting: *Deleted

## 2022-11-21 DIAGNOSIS — Z006 Encounter for examination for normal comparison and control in clinical research program: Secondary | ICD-10-CM

## 2022-11-21 NOTE — Research (Signed)
Mr Zakowski came in today for local labs to recheck his CK level. He reports no problems and that he feels fine.

## 2022-11-22 ENCOUNTER — Encounter: Payer: Self-pay | Admitting: *Deleted

## 2022-11-22 DIAGNOSIS — Z006 Encounter for examination for normal comparison and control in clinical research program: Secondary | ICD-10-CM

## 2022-11-22 LAB — CK: Total CK: 990 U/L (ref 41–331)

## 2022-11-22 NOTE — Research (Signed)
Local CK lab from 11-21-22 was 990. Was 940 on central labs for V1P research study.Dr Harrell Gave informed and message sent to Dr Ali Lowe.

## 2022-11-22 NOTE — Research (Signed)
Message left for Mr Misencik to inform him  that he will be a screen fail. Encouraged him to call with any questions. Encouraged Mr Heidelberg to call and schedule an appointment with Dr Ali Lowe.

## 2022-11-22 NOTE — Progress Notes (Signed)
Local lab redrawn due to central lab CK elevated. Please advise. Thanks.

## 2022-11-23 ENCOUNTER — Telehealth: Payer: Self-pay

## 2022-11-23 ENCOUNTER — Encounter: Payer: Self-pay | Admitting: *Deleted

## 2022-11-23 ENCOUNTER — Encounter: Payer: 59 | Admitting: Family Medicine

## 2022-11-23 DIAGNOSIS — Z006 Encounter for examination for normal comparison and control in clinical research program: Secondary | ICD-10-CM

## 2022-11-23 DIAGNOSIS — R748 Abnormal levels of other serum enzymes: Secondary | ICD-10-CM

## 2022-11-23 NOTE — Telephone Encounter (Signed)
Pt agrees to stop his STATIN and will have his repeat CK drawn at his CT appt at [facility] on 01/05/23.

## 2022-11-23 NOTE — Telephone Encounter (Signed)
-----   Message from Early Osmond, MD sent at 11/23/2022 10:06 AM EDT ----- Regarding: Elevated CK Please have patient stop his statin.  I would like this checked a CK in 1 month.

## 2022-11-23 NOTE — Research (Signed)
Spoke with Mr Richard [name] about his elevated CK labs. Informed him that Dr Ali Lowe wants him to hold his statin for 1 week, drink plenty of water, then have his CK checked again. Encouraged him to call Dr Dara Hoyer office to schedule a lab draw. Voices understanding.

## 2023-01-05 ENCOUNTER — Ambulatory Visit (HOSPITAL_BASED_OUTPATIENT_CLINIC_OR_DEPARTMENT_OTHER)
Admission: RE | Admit: 2023-01-05 | Discharge: 2023-01-05 | Disposition: A | Discharge status: Home / Self Care | Payer: 59 | Source: Ambulatory Visit | Attending: Internal Medicine | Admitting: Internal Medicine

## 2023-01-05 DIAGNOSIS — I700 Atherosclerosis of aorta: Secondary | ICD-10-CM | POA: Insufficient documentation

## 2023-01-05 DIAGNOSIS — I10 Essential (primary) hypertension: Secondary | ICD-10-CM | POA: Insufficient documentation

## 2023-01-05 DIAGNOSIS — E785 Hyperlipidemia, unspecified: Secondary | ICD-10-CM | POA: Insufficient documentation

## 2023-01-05 DIAGNOSIS — I7121 Aneurysm of the ascending aorta, without rupture: Secondary | ICD-10-CM | POA: Insufficient documentation

## 2023-01-05 DIAGNOSIS — I2510 Atherosclerotic heart disease of native coronary artery without angina pectoris: Secondary | ICD-10-CM | POA: Insufficient documentation

## 2023-01-05 DIAGNOSIS — Z6839 Body mass index (BMI) 39.0-39.9, adult: Secondary | ICD-10-CM | POA: Insufficient documentation

## 2023-01-05 LAB — POCT I-STAT CREATININE: Creatinine, Ser: 0.6 mg/dL — ABNORMAL LOW (ref 0.61–1.24)

## 2023-01-05 MED ORDER — IOHEXOL 350 MG/ML SOLN
100.0000 mL | Freq: Once | INTRAVENOUS | Status: AC | PRN
  Administered 2023-01-05: 100 mL via INTRAVENOUS

## 2023-01-08 ENCOUNTER — Inpatient Hospital Stay: Admission: RE | Admit: 2023-01-08 | Payer: 59 | Source: Ambulatory Visit

## 2023-01-10 ENCOUNTER — Other Ambulatory Visit: Payer: Self-pay | Admitting: *Deleted

## 2023-01-10 DIAGNOSIS — I7121 Aneurysm of the ascending aorta, without rupture: Secondary | ICD-10-CM

## 2023-01-10 NOTE — Progress Notes (Signed)
Order for repeat CTA chest aorta for May 2025.

## 2023-01-11 ENCOUNTER — Encounter: Payer: Self-pay | Admitting: Family Medicine

## 2023-01-11 ENCOUNTER — Other Ambulatory Visit: Payer: Self-pay | Admitting: Internal Medicine

## 2023-01-11 ENCOUNTER — Ambulatory Visit (INDEPENDENT_AMBULATORY_CARE_PROVIDER_SITE_OTHER): Payer: 59 | Admitting: Family Medicine

## 2023-01-11 VITALS — BP 145/79 | HR 83 | Temp 97.8°F | Ht 73.0 in | Wt 297.4 lb

## 2023-01-11 DIAGNOSIS — Z6839 Body mass index (BMI) 39.0-39.9, adult: Secondary | ICD-10-CM

## 2023-01-11 DIAGNOSIS — Z131 Encounter for screening for diabetes mellitus: Secondary | ICD-10-CM | POA: Diagnosis not present

## 2023-01-11 DIAGNOSIS — I10 Essential (primary) hypertension: Secondary | ICD-10-CM

## 2023-01-11 DIAGNOSIS — E785 Hyperlipidemia, unspecified: Secondary | ICD-10-CM | POA: Diagnosis not present

## 2023-01-11 DIAGNOSIS — Z125 Encounter for screening for malignant neoplasm of prostate: Secondary | ICD-10-CM

## 2023-01-11 DIAGNOSIS — G4700 Insomnia, unspecified: Secondary | ICD-10-CM

## 2023-01-11 DIAGNOSIS — K5100 Ulcerative (chronic) pancolitis without complications: Secondary | ICD-10-CM

## 2023-01-11 DIAGNOSIS — E6609 Other obesity due to excess calories: Secondary | ICD-10-CM

## 2023-01-11 DIAGNOSIS — E66812 Obesity, class 2: Secondary | ICD-10-CM

## 2023-01-11 DIAGNOSIS — Z0001 Encounter for general adult medical examination with abnormal findings: Secondary | ICD-10-CM | POA: Diagnosis not present

## 2023-01-11 LAB — CBC
HCT: 51.5 % (ref 39.0–52.0)
Hemoglobin: 17.6 g/dL — ABNORMAL HIGH (ref 13.0–17.0)
MCHC: 34.2 g/dL (ref 30.0–36.0)
MCV: 92.4 fl (ref 78.0–100.0)
Platelets: 249 10*3/uL (ref 150.0–400.0)
RBC: 5.58 Mil/uL (ref 4.22–5.81)
RDW: 14 % (ref 11.5–15.5)
WBC: 6 10*3/uL (ref 4.0–10.5)

## 2023-01-11 LAB — COMPREHENSIVE METABOLIC PANEL
ALT: 26 U/L (ref 0–53)
AST: 29 U/L (ref 0–37)
Albumin: 4.7 g/dL (ref 3.5–5.2)
Alkaline Phosphatase: 57 U/L (ref 39–117)
BUN: 15 mg/dL (ref 6–23)
CO2: 24 mEq/L (ref 19–32)
Calcium: 9.2 mg/dL (ref 8.4–10.5)
Chloride: 102 mEq/L (ref 96–112)
Creatinine, Ser: 0.75 mg/dL (ref 0.40–1.50)
GFR: 95.02 mL/min (ref >60.00)
Glucose, Bld: 104 mg/dL — ABNORMAL HIGH (ref 70–99)
Potassium: 4.3 mEq/L (ref 3.5–5.1)
Sodium: 137 mEq/L (ref 135–145)
Total Bilirubin: 0.5 mg/dL (ref 0.2–1.2)
Total Protein: 7.4 g/dL (ref 6.0–8.3)

## 2023-01-11 LAB — LIPID PANEL
Cholesterol: 262 mg/dL — ABNORMAL HIGH (ref 0–200)
HDL: 45.4 mg/dL (ref >39.00)
NonHDL: 216.66
Total CHOL/HDL Ratio: 6
Triglycerides: 226 mg/dL — ABNORMAL HIGH (ref 0.0–149.0)
VLDL: 45.2 mg/dL — ABNORMAL HIGH (ref 0.0–40.0)

## 2023-01-11 LAB — TSH: TSH: 1.02 u[IU]/mL (ref 0.35–5.50)

## 2023-01-11 LAB — PSA: PSA: 4.26 ng/mL — ABNORMAL HIGH (ref 0.10–4.00)

## 2023-01-11 LAB — LDL CHOLESTEROL, DIRECT: Direct LDL: 188 mg/dL

## 2023-01-11 LAB — HEMOGLOBIN A1C: Hgb A1c MFr Bld: 5.4 % (ref 4.6–6.5)

## 2023-01-11 MED ORDER — TRAZODONE HCL 50 MG PO TABS: 25.0000 mg | ORAL_TABLET | Freq: Every evening | ORAL | 2 refills | Status: DC | PRN

## 2023-01-11 NOTE — Assessment & Plan Note (Addendum)
Slightly elevated today.  He is currently on amlodipine benazepril 10-40 once daily.  Home readings have been at goal.  He will continue to monitor at home and let us know if persistent elevated.  His cardiologist did start him on metoprolol however he could not tolerate this due to side effects.  If blood pressure still elevated at home would consider addition of chlorthalidone.  We discussed importance of regular cardiovascular exercise and low-sodium diet.

## 2023-01-11 NOTE — Assessment & Plan Note (Signed)
Continue management per gastroenterology.

## 2023-01-11 NOTE — Assessment & Plan Note (Signed)
Stable on trazodone 25 to 50 mg nightly.  Will refill today.

## 2023-01-11 NOTE — Assessment & Plan Note (Signed)
Down 10 pounds over last few months.  Congratulated patient on weight loss.  We discussed lifestyle modifications.

## 2023-01-11 NOTE — Assessment & Plan Note (Signed)
On Crestor 40 mg daily per cardiology.  Check labs.

## 2023-01-11 NOTE — Patient Instructions (Addendum)
It was very nice to see you today!  Will give your pneumonia vaccine today.  Please monitor your blood pressure and let us know if it is persistently 140/190 or higher.  Please continue to work on diet and exercise.  Will check blood work today.  Return in about 1 year (around 01/11/2024) for Annual Physical.   Take care, Dr Jimmey Ralph  PLEASE NOTE:  If you had any lab tests, please let us know if you have not heard back within a few days. You may see your results on mychart before we have a chance to review them but we will give you a call once they are reviewed by [place].   If we ordered any referrals today, please let us know if you have not heard from their office within the next week.   If you had any urgent prescriptions sent in today, please check with the pharmacy within an hour of our visit to make sure the prescription was transmitted appropriately.   Please try these tips to maintain a healthy lifestyle:  Eat at least 3 REAL meals and 1-2 snacks per day.  Aim for no more than 5 hours between eating.  If you eat breakfast, please do so within one hour of getting up.   Each meal should contain half fruits/vegetables, one quarter protein, and one quarter carbs (no bigger than a computer mouse)  Cut down on sweet beverages. This includes juice, soda, and sweet tea.   Drink at least 1 glass of water with each meal and aim for at least 8 glasses per day  Exercise at least 150 minutes every week.    Preventive Care 54 Years and Older, Male Preventive care refers to lifestyle choices and visits with your health care provider that can promote health and wellness. Preventive care visits are also called wellness exams. What can I expect for my preventive care visit? Counseling During your preventive care visit, your health care provider may ask about your: Medical history, including: Past medical problems. Family medical history. History of falls. Current health,  including: Emotional well-being. Home life and relationship well-being. Sexual activity. Memory and ability to understand (cognition). Lifestyle, including: Alcohol, nicotine or tobacco, and drug use. Access to firearms. Diet, exercise, and sleep habits. Work and work Astronomer. Sunscreen use. Safety issues such as seatbelt and bike helmet use. Physical exam Your health care provider will check your: Height and weight. These may be used to calculate your BMI (body mass index). BMI is a measurement that tells if you are at a healthy weight. Waist circumference. This measures the distance around your waistline. This measurement also tells if you are at a healthy weight and may help predict your risk of certain diseases, such as type 2 diabetes and high blood pressure. Heart rate and blood pressure. Body temperature. Skin for abnormal spots. What immunizations do I need?  Vaccines are usually given at various ages, according to a schedule. Your health care provider will recommend vaccines for you based on your age, medical history, and lifestyle or other factors, such as travel or where you work. What tests do I need? Screening Your health care provider may recommend screening tests for certain conditions. This may include: Lipid and cholesterol levels. Diabetes screening. This is done by checking your blood sugar (glucose) after you have not eaten for a while (fasting). Hepatitis C test. Hepatitis B test. HIV (human immunodeficiency virus) test. STI (sexually transmitted infection) testing, if you are at risk. Lung cancer screening.  Colorectal cancer screening. Prostate cancer screening. Abdominal aortic aneurysm (AAA) screening. You may need this if you are a current or former smoker. Talk with your health care provider about your test results, treatment options, and if necessary, the need for more tests. Follow these instructions at home: Eating and drinking  Eat a diet that  includes fresh fruits and vegetables, whole grains, lean protein, and low-fat dairy products. Limit your intake of foods with high amounts of sugar, saturated fats, and salt. Take vitamin and mineral supplements as recommended by your health care provider. Do not drink alcohol if your health care provider tells you not to drink. If you drink alcohol: Limit how much you have to 0-2 drinks a day. Know how much alcohol is in your drink. In the [place] one drink equals one 12 oz bottle of beer (355 mL), one 5 oz glass of wine (148 mL), or one 1 oz glass of hard liquor (44 mL). Lifestyle Brush your teeth every morning and night with fluoride toothpaste. Floss one time each day. Exercise for at least 30 minutes 5 or more days each week. Do not use any products that contain nicotine or tobacco. These products include cigarettes, chewing tobacco, and vaping devices, such as e-cigarettes. If you need help quitting, ask your health care provider. Do not use drugs. If you are sexually active, practice safe sex. Use a condom or other form of protection to prevent STIs. Take aspirin only as told by your health care provider. Make sure that you understand how much to take and what form to take. Work with your health care provider to find out whether it is safe and beneficial for you to take aspirin daily. Ask your health care provider if you need to take a cholesterol-lowering medicine (statin). Find healthy ways to manage stress, such as: Meditation, yoga, or listening to music. Journaling. Talking to a trusted person. Spending time with friends and family. Safety Always wear your seat belt while driving or riding in a vehicle. Do not drive: If you have been drinking alcohol. Do not ride with someone who has been drinking. When you are tired or distracted. While texting. If you have been using any mind-altering substances or drugs. Wear a helmet and other protective equipment during sports  activities. If you have firearms in your house, make sure you follow all gun safety procedures. Minimize exposure to UV radiation to reduce your risk of skin cancer. What's next? Visit your health care provider once a year for an annual wellness visit. Ask your health care provider how often you should have your eyes and teeth checked. Stay up to date on all vaccines. This information is not intended to replace advice given to you by your health care provider. Make sure you discuss any questions you have with your health care provider. Document Revised: 02/16/2021 Document Reviewed: 02/16/2021 Elsevier Patient Education  2023 [employer].

## 2023-01-11 NOTE — Progress Notes (Signed)
Chief Complaint:  Richard [name] is a 67 y.o. male who presents today for his annual comprehensive physical exam.    Assessment/Plan:  Chronic Problems Addressed Today: Hyperlipemia On Crestor 40 mg daily per cardiology.  Check labs.  Essential hypertension Slightly elevated today.  He is currently on amlodipine benazepril 10-40 once daily.  Home readings have been at goal.  He will continue to monitor at home and let us know if persistent elevated.  His cardiologist did start him on metoprolol however he could not tolerate this due to side effects.  If blood pressure still elevated at home would consider addition of chlorthalidone.  We discussed importance of regular cardiovascular exercise and low-sodium diet.  Ulcerative colitis (HCC) Continue management per gastroenterology.   Insomnia Stable on trazodone 25 to 50 mg nightly.  Will refill today.  Obese Down 10 pounds over last few months.  Congratulated patient on weight loss.  We discussed lifestyle modifications.  Preventative Healthcare: Check labs today.  Prevnar 20 given today.  Up-to-date on colonoscopy.  Patient Counseling(The following topics were reviewed and/or handout was given):  -Nutrition: Stressed importance of moderation in sodium/caffeine intake, saturated fat and cholesterol, caloric balance, sufficient intake of fresh fruits, vegetables, and fiber.  -Stressed the importance of regular exercise.   -Substance Abuse: Discussed cessation/primary prevention of tobacco, alcohol, or other drug use; driving or other dangerous activities under the influence; availability of treatment for abuse.   -Injury prevention: Discussed safety belts, safety helmets, smoke detector, smoking near bedding or upholstery.   -Sexuality: Discussed sexually transmitted diseases, partner selection, use of condoms, avoidance of unintended pregnancy and contraceptive alternatives.   -Dental health: Discussed importance of regular tooth  brushing, flossing, and dental visits.  -Health maintenance and immunizations reviewed. Please refer to Health maintenance section.  Return to care in 1 year for next preventative visit.     Subjective:  HPI:  He has no acute complaints today.  He saw cardiology last year and was started on metoprolol for blood pressure.  Did not like the way this made him feel and he stopped taking it.  Lifestyle Det: Balanced. Plenty of fruits and vegetables.  Exercise: None specific.      01/11/2023    8:31 AM  Depression screen PHQ 2/9  Decreased Interest 0  Down, Depressed, Hopeless 0  PHQ - 2 Score 0  Altered sleeping 0  Tired, decreased energy 0  Change in appetite 0  Feeling bad or failure about yourself  0  Trouble concentrating 1  Moving slowly or fidgety/restless 0  Suicidal thoughts 0  PHQ-9 Score 1  Difficult doing work/chores Not difficult at all    Health Maintenance Due  Topic Date Due   Medicare Annual Wellness (AWV)  Never done   Zoster Vaccines- Shingrix (2 of 2) 03/28/2020   COVID-19 Vaccine (4 - 2023-24 season) 05/05/2022     ROS: Per HPI, otherwise a complete review of systems was negative.   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   with GAD    SLEEP APNEA 12/18/2008   wears CPAP   Sleep apnea    wears cpap nightly   Tubular adenoma of colon    Patient Active Problem List   Diagnosis Date Noted    ADHD 07/25/2022   Obese 03/21/2022   Insomnia 04/24/2019   Hx of adenomatous colonic polyps 10/21/2015   Recurrent major depressive disorder, in full remission (HCC) 10/07/2015   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   Sleep apnea 12/18/2008   Hyperlipemia 05/28/2007   Essential hypertension 05/28/2007   Ulcerative colitis (HCC) 05/28/2007   Past Surgical History:  Procedure Laterality Date   COLONOSCOPY  02/2017   hx polyps& IC   NASAL SINUS SURGERY     POLYPECTOMY     colon polyps   WISDOM TOOTH EXTRACTION      Family History  Problem Relation Age of Onset   Hyperlipidemia Father    Hypertension Father    Heart disease Father    Breast cancer Mother        <50   Ulcerative colitis Cousin        2 cousins paternal side   Colon polyps Cousin    Colon cancer Neg Hx    Esophageal cancer Neg Hx    Stomach cancer Neg Hx    Rectal cancer Neg Hx    Prostate cancer Neg Hx     Medications- reviewed and updated Current Outpatient Medications  Medication Sig Dispense Refill   amLODipine-benazepril (LOTREL) 10-40 MG capsule TAKE 1 CAPSULE BY MOUTH EVERY DAY 30 capsule 5   amphetamine-dextroamphetamine (ADDERALL XR) 10 MG 24 hr capsule Take 10 mg by mouth every morning. Not taken regularly-makes him feel nervous and jittery.     anastrozole (ARIMIDEX) 1 MG tablet Take by mouth.     aspirin EC 81 MG tablet Take 1 tablet (81 mg total) by mouth daily. Swallow whole. 90 tablet 3   finasteride (PROPECIA) 1 MG tablet TAKE 1 TABLET BY MOUTH EVERY DAY 90 tablet 3   Multiple Vitamins-Iron (MULTIVITAMIN/IRON) TABS Take 1 tablet by mouth daily.     NASONEX 50 MCG/ACT nasal spray PLACE 2 SQUIRTS IN EACH NOSTRIL DAILY 17 g 11   sulfaSALAzine (AZULFIDINE) 500 MG tablet TAKE 3 TABLETS BY MOUTH 2 TIMES A DAY. 540 tablet 3   TESTOSTERONE ENANTHATE IJ Inject as directed.     traZODone (DESYREL) 50 MG tablet Take 0.5-1 tablets (25-50 mg total) by mouth at bedtime as needed for sleep. 90 tablet 2    No current facility-administered medications for this visit.    Allergies-reviewed and updated No Known Allergies  Social History   Socioeconomic History   Marital status: Single    Spouse name: Not on file   Number of children: 0   Years of education: Not on file   Highest education level: Not on file  Occupational History   Occupation: Realtor    Employer: [employer]  Tobacco Use   Smoking status: Former    Packs/day: 0.50    Years: 25.00    Additional pack years: 0.00    Total pack years: 12.50    Types: Cigarettes    Quit date: 09/04/2004    Years since quitting: 18.3   Smokeless tobacco: Never  Vaping Use   Vaping Use: Never used  Substance and Sexual Activity   Alcohol use: Yes    Alcohol/week: 5.0 - 7.0 standard drinks of alcohol  Types: 5 - 7 Standard drinks or equivalent per week    Comment: 1 drink per day at most   Drug use: No   Sexual activity: Not on file  Other Topics Concern   Not on file  Social History Narrative   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 Wt Readings from Last 3 Encounters:  01/11/23 297 lb 6.4 oz (134.9 kg)  11/07/22 (!) 306 lb 9.6 oz (139.1 kg)  09/14/22 (!) 301 lb 6.4 oz (136.7 kg)   Gen: NAD, resting comfortably HEENT: TMs normal bilaterally. OP clear. No thyromegaly noted.  CV: RRR with no murmurs appreciated Pulm: NWOB, CTAB with no crackles, wheezes, or  rhonchi GI: Normal bowel sounds present. Soft, Nontender, Nondistended. MSK: no edema, cyanosis, or clubbing noted Skin: warm, dry Neuro: CN2-12 grossly intact. Strength 5/5 in upper and lower extremities. Reflexes symmetric and intact bilaterally.  Psych: Normal affect and thought content     Hansford Hirt M. Jimmey Ralph, MD 01/11/2023 10:15 AM

## 2023-01-15 ENCOUNTER — Encounter: Payer: Self-pay | Admitting: Internal Medicine

## 2023-01-15 DIAGNOSIS — Z79899 Other long term (current) drug therapy: Secondary | ICD-10-CM

## 2023-01-15 DIAGNOSIS — E785 Hyperlipidemia, unspecified: Secondary | ICD-10-CM

## 2023-01-15 MED ORDER — ROSUVASTATIN CALCIUM 40 MG PO TABS: 40.0000 mg | ORAL_TABLET | Freq: Every day | ORAL | 3 refills | Status: DC

## 2023-01-15 NOTE — Progress Notes (Signed)
His cholesterol is way up compared to last year.  Can we please verify if he is still on the Crestor 40 mg daily that was prescribed by cardiology. If not then we need to have him restart and recheck his labs again in 3 to 6 months.  If he is on crestor and we need to have him follow back up with cardiology to discuss other options to get his cholesterol lower.  His PSA is up slightly.  It has been trending up the last 2 years.  This is probably age related however would be a good idea to have him see a urologist to evaluate further.  Please place referral to urology.   The rest of his labs are all stable and we can recheck in a year.  Katina Degree. Jimmey Ralph, MD 01/15/2023 8:09 AM

## 2023-01-17 ENCOUNTER — Other Ambulatory Visit: Payer: Self-pay

## 2023-01-17 DIAGNOSIS — R972 Elevated prostate specific antigen [PSA]: Secondary | ICD-10-CM

## 2023-01-24 ENCOUNTER — Ambulatory Visit (INDEPENDENT_AMBULATORY_CARE_PROVIDER_SITE_OTHER): Payer: 59 | Admitting: Urology

## 2023-01-24 ENCOUNTER — Encounter: Payer: Self-pay | Admitting: Urology

## 2023-01-24 VITALS — BP 164/85 | HR 91 | Ht 73.0 in | Wt 294.0 lb

## 2023-01-24 DIAGNOSIS — E291 Testicular hypofunction: Secondary | ICD-10-CM | POA: Diagnosis not present

## 2023-01-24 DIAGNOSIS — R972 Elevated prostate specific antigen [PSA]: Secondary | ICD-10-CM

## 2023-01-24 LAB — URINALYSIS, ROUTINE W REFLEX MICROSCOPIC
Bilirubin, UA: NEGATIVE
Glucose, UA: NEGATIVE
Ketones, UA: NEGATIVE
Leukocytes,UA: NEGATIVE
Nitrite, UA: NEGATIVE
Protein,UA: NEGATIVE
RBC, UA: NEGATIVE
Specific Gravity, UA: 1.02 (ref 1.005–1.030)
Urobilinogen, Ur: 0.2 mg/dL (ref 0.2–1.0)
pH, UA: 8.5 — ABNORMAL HIGH (ref 5.0–7.5)

## 2023-01-24 NOTE — Progress Notes (Signed)
Assessment: 1. Elevated PSA   2. Hypogonadism in male      Plan: Today I had a long and detailed discussion with the patient regarding elevated PSA along with the issues and controversies regarding prostate cancer early detection.  We also discussed the complicating factor of being on IM testosterone injections. We discussed additional options for further evaluation in detail. Following our discussion, we will proceed with-- Hold testosterone injections exoDx urine test today for prostate cancer risk FU 2 weeks Patient interested in transitioning to aveed or potentially BI cream. Will need baseline testing  Chief Complaint: elevated psa  History of Present Illness:  Richard [name] is a 67 y.o. male with past medical history of hyperlipidemia, hypertension, ulcerative colitis, who is seen in consultation from Ardith Dark, MD for evaluation of elevated PSA.  Patient is also on IM testosterone injections that he gets had a weight loss and [facility] clinic in [place].  He has been on this for approximately 2 years.  Patient also uses finasteride 1 mg daily for Propecia. No family history of prostate cancer. Patient reports mild lower urinary tract symptoms.  Past Medical History:  Past Medical History:  Diagnosis Date   ADHD (attention deficit hyperactivity disorder) 07/25/2022   ALLERGIC RHINITIS CAUSE UNSPECIFIED 05/21/2009   Allergy    BACK PAIN, LUMBAR, WITH RADICULOPATHY 03/18/2010   COLITIS, ULCERATIVE 1997   Coronary artery calcification seen on CAT scan 01/09/2022   HYPERLIPIDEMIA 05/28/2007   HYPERTENSION 05/28/2007   HYPOGONADISM, MALE 07/24/2008   Insomnia 05/28/2007   Internal hemorrhoids    MALE PATTERN BALDNESS 05/28/2007   Recurrent major depressive disorder, in full remission (HCC) 10/07/2015   with GAD    SLEEP APNEA 12/18/2008   wears CPAP   Sleep apnea    wears cpap nightly   Tubular adenoma of colon     Past Surgical History:   Past Surgical History:  Procedure Laterality Date   COLONOSCOPY  02/2017   hx polyps& IC   NASAL SINUS SURGERY     POLYPECTOMY     colon polyps   WISDOM TOOTH EXTRACTION      Allergies:  No Known Allergies  Family History:  Family History  Problem Relation Age of Onset   Hyperlipidemia Father    Hypertension Father    Heart disease Father    Breast cancer Mother        <50   Ulcerative colitis Cousin        2 cousins paternal side   Colon polyps Cousin    Colon cancer Neg Hx    Esophageal cancer Neg Hx    Stomach cancer Neg Hx    Rectal cancer Neg Hx    Prostate cancer Neg Hx     Social History:  Social History   Tobacco Use   Smoking status: Former    Packs/day: 0.50    Years: 25.00    Additional pack years: 0.00    Total pack years: 12.50    Types: Cigarettes    Quit date: 09/04/2004    Years since quitting: 18.4   Smokeless tobacco: Never  Vaping Use   Vaping Use: Never used  Substance Use Topics   Alcohol use: Yes    Alcohol/week: 5.0 - 7.0 standard drinks of alcohol    Types: 5 - 7 Standard drinks or equivalent per week    Comment: 1 drink per day at most   Drug use: No    Review of symptoms:  Constitutional:  Negative for unexplained weight loss, night sweats, fever, chills ENT:  Negative for nose bleeds, sinus pain, painful swallowing CV:  Negative for chest pain, shortness of breath, exercise intolerance, palpitations, loss of consciousness Resp:  Negative for cough, wheezing, shortness of breath GI:  Negative for nausea, vomiting, diarrhea, bloody stools GU:  Positives noted in HPI; otherwise negative for gross hematuria, dysuria, urinary incontinence Neuro:  Negative for seizures, poor balance, limb weakness, slurred speech Psych:  Negative for lack of energy, depression, anxiety Endocrine:  Negative for polydipsia, polyuria, symptoms of hypoglycemia (dizziness, hunger, sweating) Hematologic:  Negative for anemia, purpura, petechia, prolonged  or excessive bleeding, use of anticoagulants  Allergic:  Negative for difficulty breathing or choking as a result of exposure to anything; no shellfish allergy; no allergic response (rash/itch) to materials, foods  Physical exam: BP (!) 164/85   Pulse 91   Ht 6\' 1"  (1.854 m)   Wt 294 lb (133.4 kg)   BMI 38.79 kg/m  GENERAL APPEARANCE:  Well appearing, well developed, well nourished, NAD

## 2023-01-30 ENCOUNTER — Encounter: Payer: Self-pay | Admitting: Urology

## 2023-02-02 NOTE — Progress Notes (Addendum)
Left msg for a return call re: tests performed at last visit.   Spoke with patient, he is aware of the need to come in for an ISOPSA next week at his earliest convenience. He will need to be scheduled for a 7-10 day follow up after having the test drawn.

## 2023-02-05 ENCOUNTER — Other Ambulatory Visit: Payer: Self-pay

## 2023-02-05 ENCOUNTER — Other Ambulatory Visit: Payer: 59

## 2023-02-05 DIAGNOSIS — R972 Elevated prostate specific antigen [PSA]: Secondary | ICD-10-CM | POA: Diagnosis not present

## 2023-02-05 NOTE — Progress Notes (Signed)
ERROR

## 2023-02-07 ENCOUNTER — Ambulatory Visit: Payer: 59 | Admitting: Urology

## 2023-02-12 ENCOUNTER — Encounter: Payer: Self-pay | Admitting: Urology

## 2023-02-14 ENCOUNTER — Ambulatory Visit (INDEPENDENT_AMBULATORY_CARE_PROVIDER_SITE_OTHER): Payer: 59 | Admitting: Urology

## 2023-02-14 ENCOUNTER — Encounter: Payer: Self-pay | Admitting: Urology

## 2023-02-14 VITALS — BP 172/88 | HR 94 | Ht 73.0 in | Wt 300.0 lb

## 2023-02-14 DIAGNOSIS — R972 Elevated prostate specific antigen [PSA]: Secondary | ICD-10-CM

## 2023-02-14 DIAGNOSIS — E291 Testicular hypofunction: Secondary | ICD-10-CM

## 2023-02-14 NOTE — Progress Notes (Signed)
   Assessment: 1. Elevated PSA   2. Hypogonadism in male     Plan: Will change testosterone replacement therapy from IM injections to avoid supraphysiologic levels to daily transdermal bioidentical testosterone. Rx: Bioidentical testosterone 20%-apply 1 mL daily Nature of medication including proper utilization as well as potential adverse events and side effects reviewed.  Testosterone level in 6 weeks Follow-up 3 months with free and total PSA prior to visit  Chief Complaint: Elevated psa  HPI: Richard [name] is a 67 y.o. male who presents for continued evaluation of elevated psa.  Patient has also been on TRT with T injections thru [facility] clinic. Patient currently feels well and is without GU complaints.  He would like to resume testosterone replacement.    PSA DATA: 06/2012  0.68 11/2019   1.71  started T late 2021 11/2020   3.46 11/2021   3.56 01/2023   4.26  stopped T injection 02/2023   3.33   Portions of the above documentation were copied from a prior visit for review purposes only.  Allergies: No Known Allergies  PMH: Past Medical History:  Diagnosis Date   ADHD (attention deficit hyperactivity disorder) 07/25/2022   ALLERGIC RHINITIS CAUSE UNSPECIFIED 05/21/2009   Allergy    BACK PAIN, LUMBAR, WITH RADICULOPATHY 03/18/2010   COLITIS, ULCERATIVE 1997   Coronary artery calcification seen on CAT scan 01/09/2022   HYPERLIPIDEMIA 05/28/2007   HYPERTENSION 05/28/2007   HYPOGONADISM, MALE 07/24/2008   Insomnia 05/28/2007   Internal hemorrhoids    MALE PATTERN BALDNESS 05/28/2007   Recurrent major depressive disorder, in full remission (HCC) 10/07/2015   with GAD    SLEEP APNEA 12/18/2008   wears CPAP   Sleep apnea    wears cpap nightly   Tubular adenoma of colon     PSH: Past Surgical History:  Procedure Laterality Date   COLONOSCOPY  02/2017   hx polyps& IC   NASAL SINUS SURGERY     POLYPECTOMY     colon polyps   WISDOM TOOTH EXTRACTION       SH: Social History   Tobacco Use   Smoking status: Former    Packs/day: 0.50    Years: 25.00    Additional pack years: 0.00    Total pack years: 12.50    Types: Cigarettes    Quit date: 09/04/2004    Years since quitting: 18.4   Smokeless tobacco: Never  Vaping Use   Vaping Use: Never used  Substance Use Topics   Alcohol use: Yes    Alcohol/week: 5.0 - 7.0 standard drinks of alcohol    Types: 5 - 7 Standard drinks or equivalent per week    Comment: 1 drink per day at most   Drug use: No    ROS: Constitutional:  Negative for fever, chills, weight loss CV: Negative for chest pain, previous MI, hypertension Respiratory:  Negative for shortness of breath, wheezing, sleep apnea, frequent cough GI:  Negative for nausea, vomiting, bloody stool, GERD  PE: BP (!) 172/88   Pulse 94   Ht 6\' 1"  (1.854 m)   Wt 300 lb (136.1 kg)   BMI 39.58 kg/m  GENERAL APPEARANCE:  Well appearing, well developed, well nourished, NAD

## 2023-02-15 ENCOUNTER — Encounter: Payer: Self-pay | Admitting: Family Medicine

## 2023-02-21 LAB — MICROSCOPIC EXAMINATION
Bacteria, UA: NONE SEEN
Cast Type: NONE SEEN
Casts: NONE SEEN /lpf
Crystal Type: NONE SEEN
Crystals: NONE SEEN
Epithelial Cells (non renal): NONE SEEN /hpf (ref 0–10)
Renal Epithel, UA: NONE SEEN /hpf
Trichomonas, UA: NONE SEEN
WBC, UA: NONE SEEN /hpf (ref 0–5)
Yeast, UA: NONE SEEN

## 2023-02-21 LAB — URINALYSIS, ROUTINE W REFLEX MICROSCOPIC
Bilirubin, UA: NEGATIVE
Glucose, UA: NEGATIVE
Ketones, UA: NEGATIVE
Leukocytes,UA: NEGATIVE
Nitrite, UA: NEGATIVE
Protein,UA: NEGATIVE
Specific Gravity, UA: 1.025 (ref 1.005–1.030)
Urobilinogen, Ur: 1 mg/dL (ref 0.2–1.0)
pH, UA: 7 (ref 5.0–7.5)

## 2023-02-22 ENCOUNTER — Encounter: Payer: Self-pay | Admitting: Urology

## 2023-02-22 ENCOUNTER — Encounter: Payer: Self-pay | Admitting: Family Medicine

## 2023-02-22 ENCOUNTER — Ambulatory Visit (INDEPENDENT_AMBULATORY_CARE_PROVIDER_SITE_OTHER): Payer: 59 | Admitting: Family Medicine

## 2023-02-22 VITALS — BP 129/79 | HR 81 | Temp 97.7°F | Ht 73.0 in | Wt 304.4 lb

## 2023-02-22 DIAGNOSIS — R972 Elevated prostate specific antigen [PSA]: Secondary | ICD-10-CM | POA: Insufficient documentation

## 2023-02-22 DIAGNOSIS — R7989 Other specified abnormal findings of blood chemistry: Secondary | ICD-10-CM | POA: Diagnosis not present

## 2023-02-22 DIAGNOSIS — I10 Essential (primary) hypertension: Secondary | ICD-10-CM

## 2023-02-22 NOTE — Patient Instructions (Addendum)
It was very nice to see you today!  I think your elevated blood pressure readings probably due to your testosterone.  We can continue to monitor for now.  Let us know if it is persistently elevated to 140/90 or higher at home.  Return if symptoms worsen or fail to improve.   Take care, Dr Jimmey Ralph  PLEASE NOTE:  If you had any lab tests, please let us know if you have not heard back within a few days. You may see your results on mychart before we have a chance to review them but we will give you a call once they are reviewed by [place].   If we ordered any referrals today, please let us know if you have not heard from their office within the next week.   If you had any urgent prescriptions sent in today, please check with the pharmacy within an hour of our visit to make sure the prescription was transmitted appropriately.   Please try these tips to maintain a healthy lifestyle:  Eat at least 3 REAL meals and 1-2 snacks per day.  Aim for no more than 5 hours between eating.  If you eat breakfast, please do so within one hour of getting up.   Each meal should contain half fruits/vegetables, one quarter protein, and one quarter carbs (no bigger than a computer mouse)  Cut down on sweet beverages. This includes juice, soda, and sweet tea.   Drink at least 1 glass of water with each meal and aim for at least 8 glasses per day  Exercise at least 150 minutes every week.

## 2023-02-22 NOTE — Progress Notes (Signed)
   Richard [name] is a 67 y.o. male who presents today for an office visit.  Assessment/Plan:  Chronic Problems Addressed Today: Essential hypertension He has had high readings for last few weeks however this is at goal today.  It is possible that his supraphysiologic testosterone could have been contributing to his testosterone.  We did discuss potentially adding on additional medication however given that his blood pressure is at goal today we will continue with current regimen of amlodipine-benazepril 10-40 once daily.  He will continue to monitor at home.  He will let us know if persistently elevated and would consider adding on HCTZ at that time.  Elevated PSA Continue management per urology.  Low testosterone Continue management per urology. Now on transdermal to start on replacement.     Subjective:  HPI:  See A/P for status of chronic conditions.  Patient is here today for follow-up.  Last saw him a little over a month ago.  He is concerned about elevated blood pressure readings.  He has noticed that home readings have been in the 140's/90's and most recently was 170s/120s.   He was referred to urology for elevated PSA. They thought this was probably due to testosterone that he was getting from [facility].  He has since been transition to topical therapy.        Objective:  Physical Exam: BP 129/79   Pulse 81   Temp 97.7 F (36.5 C) (Temporal)   Ht 6\' 1"  (1.854 m)   Wt (!) 304 lb 6.4 oz (138.1 kg)   SpO2 96%   BMI 40.16 kg/m   Gen: No acute distress, resting comfortably Neuro: Grossly normal, moves all extremities Psych: Normal affect and thought content      Richard [name] M. Jimmey Ralph, MD 02/22/2023 10:35 AM

## 2023-02-22 NOTE — Assessment & Plan Note (Signed)
Continue management per urology. Now on transdermal to start on replacement.

## 2023-02-22 NOTE — Assessment & Plan Note (Signed)
Continue management per urology. 

## 2023-02-22 NOTE — Assessment & Plan Note (Signed)
He has had high readings for last few weeks however this is at goal today.  It is possible that his supraphysiologic testosterone could have been contributing to his testosterone.  We did discuss potentially adding on additional medication however given that his blood pressure is at goal today we will continue with current regimen of amlodipine-benazepril 10-40 once daily.  He will continue to monitor at home.  He will let us know if persistently elevated and would consider adding on HCTZ at that time.

## 2023-02-28 ENCOUNTER — Other Ambulatory Visit: Payer: Self-pay | Admitting: Family Medicine

## 2023-02-28 DIAGNOSIS — H401131 Primary open-angle glaucoma, bilateral, mild stage: Secondary | ICD-10-CM | POA: Diagnosis not present

## 2023-02-28 DIAGNOSIS — H2513 Age-related nuclear cataract, bilateral: Secondary | ICD-10-CM | POA: Diagnosis not present

## 2023-02-28 DIAGNOSIS — H25013 Cortical age-related cataract, bilateral: Secondary | ICD-10-CM | POA: Diagnosis not present

## 2023-03-06 ENCOUNTER — Encounter: Payer: Self-pay | Admitting: Family Medicine

## 2023-03-07 ENCOUNTER — Other Ambulatory Visit: Payer: Self-pay | Admitting: *Deleted

## 2023-03-07 MED ORDER — HYDROCHLOROTHIAZIDE 12.5 MG PO TABS: 12.5000 mg | ORAL_TABLET | Freq: Every day | ORAL | 1 refills | Status: DC

## 2023-03-07 NOTE — Telephone Encounter (Signed)
prescription for send to pharmacy  Patient notified, f/u with PCP in 1-2 weeks

## 2023-03-07 NOTE — Telephone Encounter (Signed)
Please see message and advise 

## 2023-03-07 NOTE — Telephone Encounter (Signed)
Please send in hydrochlorothiazide 12.5 mg daily and have him follow up here in 1-2 weeks.  Katina Degree. Jimmey Ralph, MD 03/07/2023 9:37 AM

## 2023-03-11 ENCOUNTER — Other Ambulatory Visit: Payer: Self-pay | Admitting: Family Medicine

## 2023-03-20 ENCOUNTER — Ambulatory Visit: Payer: Medicare HMO | Attending: Internal Medicine

## 2023-03-20 DIAGNOSIS — Z79899 Other long term (current) drug therapy: Secondary | ICD-10-CM

## 2023-03-20 DIAGNOSIS — E785 Hyperlipidemia, unspecified: Secondary | ICD-10-CM | POA: Diagnosis not present

## 2023-03-21 LAB — LIPID PANEL
Chol/HDL Ratio: 3.1 ratio (ref 0.0–5.0)
Cholesterol, Total: 158 mg/dL (ref 100–199)
HDL: 51 mg/dL (ref >39)
LDL Chol Calc (NIH): 70 mg/dL (ref 0–99)
Triglycerides: 224 mg/dL — ABNORMAL HIGH (ref 0–149)
VLDL Cholesterol Cal: 37 mg/dL (ref 5–40)

## 2023-03-21 LAB — HEPATIC FUNCTION PANEL
ALT: 36 IU/L (ref 0–44)
AST: 33 IU/L (ref 0–40)
Albumin: 4.2 g/dL (ref 3.9–4.9)
Alkaline Phosphatase: 59 IU/L (ref 44–121)
Bilirubin Total: 0.3 mg/dL (ref 0.0–1.2)
Bilirubin, Direct: 0.14 mg/dL (ref 0.00–0.40)
Total Protein: 6.8 g/dL (ref 6.0–8.5)

## 2023-03-29 ENCOUNTER — Telehealth: Payer: Self-pay

## 2023-03-29 DIAGNOSIS — E785 Hyperlipidemia, unspecified: Secondary | ICD-10-CM

## 2023-03-29 MED ORDER — EZETIMIBE 10 MG PO TABS: 10.0000 mg | ORAL_TABLET | Freq: Every day | ORAL | 3 refills | Status: DC

## 2023-03-29 NOTE — Telephone Encounter (Signed)
Patient is aware of Dr. Trula Ore recommendations. Zetia has been sent in and labs have been scheduled.

## 2023-03-29 NOTE — Telephone Encounter (Signed)
-----   Message from Orbie Pyo sent at 03/28/2023  7:39 AM EDT ----- Add Zetia 10mg , lipids/LFTs in 2 months, thanks. ----- Message ----- From: Lendon Ka, RN Sent: 03/21/2023  12:12 PM EDT To: Orbie Pyo, MD  Spoke w the patient.  He is currently taking 40 mg Crestor daily. Aware I will check w Dr. Lynnette Caffey and if any further changes will let him know.

## 2023-03-31 ENCOUNTER — Other Ambulatory Visit: Payer: Self-pay | Admitting: Family Medicine

## 2023-04-11 ENCOUNTER — Other Ambulatory Visit: Payer: Self-pay | Admitting: *Deleted

## 2023-04-11 ENCOUNTER — Encounter: Payer: Self-pay | Admitting: Family Medicine

## 2023-04-11 MED ORDER — FINASTERIDE 1 MG PO TABS: 1.0000 mg | ORAL_TABLET | Freq: Every day | ORAL | 3 refills | Status: DC

## 2023-04-18 DIAGNOSIS — H401131 Primary open-angle glaucoma, bilateral, mild stage: Secondary | ICD-10-CM | POA: Diagnosis not present

## 2023-04-18 DIAGNOSIS — H2513 Age-related nuclear cataract, bilateral: Secondary | ICD-10-CM | POA: Diagnosis not present

## 2023-05-07 ENCOUNTER — Encounter: Payer: Self-pay | Admitting: Internal Medicine

## 2023-05-08 ENCOUNTER — Telehealth: Payer: Self-pay | Admitting: Pharmacist

## 2023-05-08 NOTE — Telephone Encounter (Signed)
[facility] submitted under CV indication, key [id number]. Med seems to be non formulary based on PA questions. Can respond to pt via MyChart message when determination is made.

## 2023-05-10 ENCOUNTER — Other Ambulatory Visit: Payer: 59

## 2023-05-10 DIAGNOSIS — E291 Testicular hypofunction: Secondary | ICD-10-CM | POA: Diagnosis not present

## 2023-05-10 DIAGNOSIS — R972 Elevated prostate specific antigen [PSA]: Secondary | ICD-10-CM

## 2023-05-10 MED ORDER — WEGOVY 0.25 MG/0.5ML ~~LOC~~ SOAJ: 0.2500 mg | SUBCUTANEOUS | 0 refills | Status: DC

## 2023-05-10 NOTE — Telephone Encounter (Signed)
[facility] prior authorization has been approved through 09/04/23. Rx sent to pharmacy to determine price, have communicated with pt via mychart.

## 2023-05-11 LAB — TESTOSTERONE,FREE AND TOTAL
Testosterone, Free: 3.8 pg/mL — ABNORMAL LOW (ref 6.6–18.1)
Testosterone: 373 ng/dL (ref 264–916)

## 2023-05-11 LAB — PSA, TOTAL AND FREE
PSA, Free Pct: 16 %
PSA, Free: 0.32 ng/mL
Prostate Specific Ag, Serum: 2 ng/mL (ref 0.0–4.0)

## 2023-05-16 ENCOUNTER — Ambulatory Visit: Payer: 59 | Admitting: Urology

## 2023-05-17 ENCOUNTER — Ambulatory Visit: Payer: 59 | Admitting: Urology

## 2023-05-21 ENCOUNTER — Telehealth: Payer: Self-pay | Admitting: Urology

## 2023-05-21 NOTE — Telephone Encounter (Signed)
Pt called needing to move appt. Called pt to r/s and pt did not answer. LVM for pt to call back and r/s

## 2023-05-23 ENCOUNTER — Ambulatory Visit: Payer: 59 | Admitting: Urology

## 2023-05-31 ENCOUNTER — Ambulatory Visit: Payer: Medicare HMO | Attending: Internal Medicine

## 2023-05-31 ENCOUNTER — Ambulatory Visit (INDEPENDENT_AMBULATORY_CARE_PROVIDER_SITE_OTHER): Payer: Medicare Other | Admitting: Urology

## 2023-05-31 ENCOUNTER — Encounter: Payer: Self-pay | Admitting: Urology

## 2023-05-31 VITALS — BP 150/83 | HR 74 | Ht 73.0 in | Wt 294.0 lb

## 2023-05-31 DIAGNOSIS — E785 Hyperlipidemia, unspecified: Secondary | ICD-10-CM

## 2023-05-31 DIAGNOSIS — E291 Testicular hypofunction: Secondary | ICD-10-CM

## 2023-05-31 LAB — LIPID PANEL
Chol/HDL Ratio: 2.9 ratio (ref 0.0–5.0)
Cholesterol, Total: 148 mg/dL (ref 100–199)
HDL: 51 mg/dL (ref >39)
LDL Chol Calc (NIH): 68 mg/dL (ref 0–99)
Triglycerides: 172 mg/dL — ABNORMAL HIGH (ref 0–149)
VLDL Cholesterol Cal: 29 mg/dL (ref 5–40)

## 2023-05-31 LAB — HEPATIC FUNCTION PANEL
ALT: 26 IU/L (ref 0–44)
AST: 27 IU/L (ref 0–40)
Albumin: 4.6 g/dL (ref 3.9–4.9)
Alkaline Phosphatase: 59 IU/L (ref 44–121)
Bilirubin Total: 0.3 mg/dL (ref 0.0–1.2)
Bilirubin, Direct: 0.13 mg/dL (ref 0.00–0.40)
Total Protein: 6.9 g/dL (ref 6.0–8.5)

## 2023-05-31 NOTE — Progress Notes (Signed)
   Assessment: 1. Hypogonadism in male     Plan: Patient will continue transdermal bioidentical testosterone 20% 1 mL daily Will continue with yearly follow-up with routine laboratory studies including PSA, testosterone, CMP, CBC and lipid panel.  Chief Complaint: Low T  HPI: Richard [name] is a 67 y.o. male who presents for continued evaluation of hypogonadism and borderline PSA. See my note 01/24/2023 at the time of initial visit for detailed history.  The patient had been on testosterone replacement through a men's health clinic and had very high levels and also was found to have a rise in his PSA.  We subsequently switched him to a transdermal bioidentical cream 20%.  He reports doing well on this without hypogonadal symptoms.  His PSA is also returned to normal and he has good testosterone level.  Review of lab studies 05/2023 PSA 2.0 Testosterone 373  Portions of the above documentation were copied from a prior visit for review purposes only.  Allergies: No Known Allergies  PMH: Past Medical History:  Diagnosis Date   ADHD (attention deficit hyperactivity disorder) 07/25/2022   ALLERGIC RHINITIS CAUSE UNSPECIFIED 05/21/2009   Allergy    BACK PAIN, LUMBAR, WITH RADICULOPATHY 03/18/2010   COLITIS, ULCERATIVE 1997   Coronary artery calcification seen on CAT scan 01/09/2022   HYPERLIPIDEMIA 05/28/2007   HYPERTENSION 05/28/2007   HYPOGONADISM, MALE 07/24/2008   Insomnia 05/28/2007   Internal hemorrhoids    MALE PATTERN BALDNESS 05/28/2007   Recurrent major depressive disorder, in full remission (HCC) 10/07/2015   with GAD    SLEEP APNEA 12/18/2008   wears CPAP   Sleep apnea    wears cpap nightly   Tubular adenoma of colon     PSH: Past Surgical History:  Procedure Laterality Date   COLONOSCOPY  02/2017   hx polyps& IC   NASAL SINUS SURGERY     POLYPECTOMY     colon polyps   WISDOM TOOTH EXTRACTION      SH: Social History   Tobacco Use   Smoking  status: Former    Current packs/day: 0.00    Average packs/day: 0.5 packs/day for 25.0 years (12.5 ttl pk-yrs)    Types: Cigarettes    Start date: 09/05/1979    Quit date: 09/04/2004    Years since quitting: 18.7   Smokeless tobacco: Never  Vaping Use   Vaping status: Never Used  Substance Use Topics   Alcohol use: Yes    Alcohol/week: 5.0 - 7.0 standard drinks of alcohol    Types: 5 - 7 Standard drinks or equivalent per week    Comment: 1 drink per day at most   Drug use: No    ROS: Constitutional:  Negative for fever, chills, weight loss CV: Negative for chest pain, previous MI, hypertension Respiratory:  Negative for shortness of breath, wheezing, sleep apnea, frequent cough GI:  Negative for nausea, vomiting, bloody stool, GERD  PE: BP (!) 150/83   Pulse 74   Ht 6\' 1"  (1.854 m)   Wt 294 lb (133.4 kg)   BMI 38.79 kg/m  GENERAL APPEARANCE:  Well appearing, well developed, well nourished, NAD    Results: UA clear

## 2023-06-03 ENCOUNTER — Other Ambulatory Visit: Payer: Self-pay | Admitting: Family Medicine

## 2023-06-04 ENCOUNTER — Encounter: Payer: Self-pay | Admitting: Pharmacist

## 2023-06-04 LAB — URINALYSIS, ROUTINE W REFLEX MICROSCOPIC
Bilirubin, UA: NEGATIVE
Glucose, UA: NEGATIVE
Ketones, UA: NEGATIVE
Leukocytes,UA: NEGATIVE
Nitrite, UA: NEGATIVE
Protein,UA: NEGATIVE
RBC, UA: NEGATIVE
Specific Gravity, UA: 1.02 (ref 1.005–1.030)
Urobilinogen, Ur: 0.2 mg/dL (ref 0.2–1.0)
pH, UA: 7.5 (ref 5.0–7.5)

## 2023-06-07 ENCOUNTER — Telehealth: Payer: Self-pay | Admitting: Pharmacist

## 2023-06-07 ENCOUNTER — Other Ambulatory Visit: Payer: Self-pay | Admitting: Internal Medicine

## 2023-06-07 NOTE — Telephone Encounter (Signed)
Received refill request for [facility] refill, call to see how patient is doing on current dose. Also saw message from [facility] about scheduling appointment for lipid  N/A and LVM

## 2023-06-08 MED ORDER — WEGOVY 0.5 MG/0.5ML ~~LOC~~ SOAJ: 0.5000 mg | SUBCUTANEOUS | 0 refills | Status: DC

## 2023-06-08 NOTE — Telephone Encounter (Signed)
Communicated with pt in separate mychart message thread:  Excellent, I'm glad you've been tolerating it well! I'll send in a prescription for the next month - it will be for a slightly higher dose of 0.5mg  once weekly. We'll touch base in an other month to see how you're feeling on this dose.  I see that our other pharmacist was trying to schedule an appointment with you to discuss your cholesterol. Our schedule next week is booked but we have an opening on October 16th at 2:30pm that I added you in for - let me know if this doesn't work and we can move it for you.  Thanks, [employer]

## 2023-06-08 NOTE — Telephone Encounter (Signed)
A few mychart messages are open, copying message sent to pt here for completion:   Excellent, I'm glad you've been tolerating it well! I'll send in a prescription for the next month - it will be for a slightly higher dose of 0.5mg  once weekly. We'll touch base in an other month to see how you're feeling on this dose.  I see that our other pharmacist was trying to schedule an appointment with you to discuss your cholesterol. Our schedule next week is booked but we have an opening on October 16th at 2:30pm that I added you in for - let me know if this doesn't work and we can move it for you.  Thanks, [employer]

## 2023-06-15 ENCOUNTER — Other Ambulatory Visit (HOSPITAL_COMMUNITY): Payer: Self-pay

## 2023-06-15 MED ORDER — WEGOVY 0.5 MG/0.5ML ~~LOC~~ SOAJ
0.5000 mg | SUBCUTANEOUS | 0 refills | Status: DC
  Filled 2023-06-15: qty 2, 28d supply, fill #0
  Filled 2023-06-15: qty 2, fill #0

## 2023-06-18 ENCOUNTER — Other Ambulatory Visit (HOSPITAL_COMMUNITY): Payer: Self-pay

## 2023-06-19 ENCOUNTER — Encounter: Payer: Self-pay | Admitting: Family Medicine

## 2023-06-19 ENCOUNTER — Other Ambulatory Visit (HOSPITAL_COMMUNITY): Payer: Self-pay

## 2023-06-19 NOTE — Telephone Encounter (Signed)
Unable to reach pt , lvm to return call so that he can be triaged

## 2023-06-19 NOTE — Telephone Encounter (Signed)
Patient needs to be triaged.

## 2023-06-20 ENCOUNTER — Ambulatory Visit: Payer: Medicare HMO

## 2023-06-20 NOTE — Telephone Encounter (Signed)
Final outcome: See PCP within 24 hours. See message below.   Patient Name First: Richard Last: [name] Gender: Male DOB: 1956/05/29 Age: 67 Y 5 M 19 D Return Phone Number: [phone] (Primary) Address: City/ State/ Zip: [place]  [address] Client [facility] - Administrator, [facility] Provider Jacquiline Doe- MD Contact Type Call Who Is Calling Patient / Member / Family / Caregiver Call Type Triage / Clinical Relationship To Patient Self Return Phone Number [phone] (Primary) Chief Complaint Abdominal Pain Reason for Call Symptomatic / Request for Health Information Initial Comment Caller states the pt is having lower right abdominal pain and having a sharp pain. Not severe Translation No Nurse Assessment Nurse: Earlene Plater, RN, Lesly Rubenstein Date/Time (Eastern Time): 06/20/2023 1:36:58 PM Confirm and document reason for call. If symptomatic, describe symptoms. ---Caller states he is having sharp lower right side abdominal pain, was severe a few days ago, pain mildmoderate with movement or pressing on area. Does the patient have any new or worsening symptoms? ---Yes Will a triage be completed? ---Yes Related visit to physician within the last 2 weeks? ---No Does the PT have any chronic conditions? (i.e. diabetes, asthma, this includes High risk factors for pregnancy, etc.) ---Yes List chronic conditions. ---ulcerative colitis, htn, hyperlipidemia Is this a behavioral health or substance abuse call? ---No Guidelines Guideline Title Affirmed Question Affirmed Notes Nurse Date/Time (Eastern Time) Abdominal Pain - Male [1] MODERATE pain (e.g., interferes with normal activities) AND [2] pain comes and goes (cramps) AND [3] present > 24 hours (Exception: Pain with Earlene Plater RN, Lesly Rubenstein 06/20/2023 1:39:37 PM Guidelines Guideline Title Affirmed Question Affirmed Notes Nurse Date/Time (Eastern Time) Vomiting  or Diarrhea - see that Guideline.) Disp. Time [name] Time) Disposition Final User 06/20/2023 1:41:29 PM See PCP within 24 Hours Yes Earlene Plater, RN, Lesly Rubenstein Final Disposition 06/20/2023 1:41:29 PM See PCP within 24 Hours Yes Earlene Plater, RN, Lesly Rubenstein Caller Disagree/Comply Comply Caller Understands Yes PreDisposition Did not know what to do Care Advice Given Per Guideline SEE PCP WITHIN 24 HOURS: * IF OFFICE WILL BE OPEN: You need to be examined within the next 24 hours. Call your doctor (or NP/PA) when the office opens and make an appointment. CALL BACK IF: * You become worse CARE ADVICE given per Abdominal Pain - Male (Adult) guideline. Comments User: Weston Settle, RN Date/Time [name] Time): 06/20/2023 1:51:46 PM Caller warm transferred to office for appointment per patient request. Referrals REFERRED TO PCP OFFICE

## 2023-06-20 NOTE — Telephone Encounter (Signed)
Richard [name], Access nurse, called back stating final outcome was for patient to be seen within 24 hours. I was able to speak with patient and schedule him for 10/17 2 10  am w/ Jarold Motto since he wasn't available today. Awaiting triage note.

## 2023-06-20 NOTE — Telephone Encounter (Signed)
FYI: This call has been transferred to triage nurse: the Triage Nurse. Once the result note has been entered staff can address the message at that time.  Patient called in with the following symptoms:  Red Word:abdominal pain   Please advise at Mobile [phone] (mobile)  Message is routed to Provider Pool.

## 2023-06-21 ENCOUNTER — Encounter: Payer: Self-pay | Admitting: Physician Assistant

## 2023-06-21 ENCOUNTER — Ambulatory Visit (INDEPENDENT_AMBULATORY_CARE_PROVIDER_SITE_OTHER): Payer: Medicare HMO | Admitting: Physician Assistant

## 2023-06-21 VITALS — BP 130/74 | HR 81 | Temp 97.8°F | Ht 73.0 in | Wt 291.0 lb

## 2023-06-21 DIAGNOSIS — Z23 Encounter for immunization: Secondary | ICD-10-CM | POA: Diagnosis not present

## 2023-06-21 DIAGNOSIS — R109 Unspecified abdominal pain: Secondary | ICD-10-CM | POA: Diagnosis not present

## 2023-06-21 LAB — COMPREHENSIVE METABOLIC PANEL
ALT: 21 U/L (ref 0–53)
AST: 22 U/L (ref 0–37)
Albumin: 4.4 g/dL (ref 3.5–5.2)
Alkaline Phosphatase: 56 U/L (ref 39–117)
BUN: 13 mg/dL (ref 6–23)
CO2: 26 meq/L (ref 19–32)
Calcium: 9.3 mg/dL (ref 8.4–10.5)
Chloride: 103 meq/L (ref 96–112)
Creatinine, Ser: 0.69 mg/dL (ref 0.40–1.50)
GFR: 97.14 mL/min (ref >60.00)
Glucose, Bld: 95 mg/dL (ref 70–99)
Potassium: 4 meq/L (ref 3.5–5.1)
Sodium: 137 meq/L (ref 135–145)
Total Bilirubin: 0.5 mg/dL (ref 0.2–1.2)
Total Protein: 7.5 g/dL (ref 6.0–8.3)

## 2023-06-21 LAB — URINALYSIS, ROUTINE W REFLEX MICROSCOPIC
Bilirubin Urine: NEGATIVE
Hgb urine dipstick: NEGATIVE
Ketones, ur: NEGATIVE
Leukocytes,Ua: NEGATIVE
Nitrite: NEGATIVE
Specific Gravity, Urine: 1.015 (ref 1.000–1.030)
Urine Glucose: NEGATIVE
Urobilinogen, UA: 2 — AB (ref 0.0–1.0)
pH: 7.5 (ref 5.0–8.0)

## 2023-06-21 LAB — CBC WITH DIFFERENTIAL/PLATELET
Basophils Absolute: 0 10*3/uL (ref 0.0–0.1)
Basophils Relative: 0.4 % (ref 0.0–3.0)
Eosinophils Absolute: 0.2 10*3/uL (ref 0.0–0.7)
Eosinophils Relative: 2.9 % (ref 0.0–5.0)
HCT: 44.2 % (ref 39.0–52.0)
Hemoglobin: 14.6 g/dL (ref 13.0–17.0)
Lymphocytes Relative: 22.8 % (ref 12.0–46.0)
Lymphs Abs: 1.6 10*3/uL (ref 0.7–4.0)
MCHC: 33.1 g/dL (ref 30.0–36.0)
MCV: 93.6 fL (ref 78.0–100.0)
Monocytes Absolute: 0.7 10*3/uL (ref 0.1–1.0)
Monocytes Relative: 10.1 % (ref 3.0–12.0)
Neutro Abs: 4.6 10*3/uL (ref 1.4–7.7)
Neutrophils Relative %: 63.8 % (ref 43.0–77.0)
Platelets: 264 10*3/uL (ref 150.0–400.0)
RBC: 4.72 Mil/uL (ref 4.22–5.81)
RDW: 12.6 % (ref 11.5–15.5)
WBC: 7.2 10*3/uL (ref 4.0–10.5)

## 2023-06-21 NOTE — Patient Instructions (Signed)
It was great to see you!  We will update your blood work today  Lets get an xray To have this done, you can walk in at the [facility] location without a scheduled appointment.  The address is [address]. It is across the street from [facility]. xray are located in the basement.   Hours of operation are M-F 8:30am to 5:00pm.  Please note that they are closed for lunch between 12:30 and 1:00pm.   Take care,  Jarold Motto PA-C

## 2023-06-21 NOTE — Telephone Encounter (Signed)
See note

## 2023-06-21 NOTE — Progress Notes (Signed)
Richard [name] is a 67 y.o. male here for a follow-up of a pre-existing problem.  History of Present Illness:   Chief Complaint  Patient presents with   Abdominal Pain    Pt c/o RUQ abdominal pain x 2 weeks, some nausea off and on, no vomiting. Pt started on Wegovy 5 weeks ago was constipated, took stool softener and has resolved.   HPI  RUQ: Called office on 10/15 to report intermittent sharp RLQ pain, was severe a few days ago, pain mild/moderate with movement or palpation of area that started 2 weeks ago.  Today reports improvement, but is still experiencing pain on palpation of area.  Really bad a week ago, now not so bad, still feeling pain if palpating. Notes he was prescribed Wegovy 5 weeks ago, and started experiencing constipation so he started taking Colace which has resolved the constipation. Describes sx at their worst last week Tue-Fri, and had a sharp pain rated at 6-7/10 for 1-2 days.  Endorses mid-abdominal tenderness.  Notes that he's pretty sure he hasn't over-exerted himself or pulled a muscle.  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/07/2015   with GAD    SLEEP APNEA 12/18/2008   wears CPAP   Sleep apnea    wears cpap nightly   Tubular adenoma of colon     Social History   Tobacco Use   Smoking status: Former    Current packs/day: 0.00    Average packs/day: 0.5 packs/day for  25.0 years (12.5 ttl pk-yrs)    Types: Cigarettes    Start date: 09/05/1979    Quit date: 09/04/2004    Years since quitting: 18.8   Smokeless tobacco: Never  Vaping Use   Vaping status: Never Used  Substance Use Topics   Alcohol use: Yes    Alcohol/week: 5.0 - 7.0 standard drinks of alcohol    Types: 5 - 7 Standard drinks or equivalent per week    Comment: 1 drink per day at most   Drug use: No   Past Surgical History:  Procedure Laterality Date   COLONOSCOPY  02/2017   hx polyps& IC   NASAL SINUS SURGERY     POLYPECTOMY     colon polyps   WISDOM TOOTH EXTRACTION     Family History  Problem Relation Age of Onset   Hyperlipidemia Father    Hypertension Father    Heart disease Father    Breast cancer Mother        <50   Ulcerative colitis Cousin        2 cousins paternal side   Colon polyps Cousin    Colon cancer Neg Hx    Esophageal cancer Neg Hx    Stomach cancer Neg Hx    Rectal cancer Neg Hx    Prostate cancer Neg Hx    No Known Allergies Current Medications:   Current Outpatient Medications:    amLODipine-benazepril (LOTREL) 10-40 MG capsule, TAKE 1  CAPSULE BY MOUTH EVERY DAY, Disp: 30 capsule, Rfl: 5   amphetamine-dextroamphetamine (ADDERALL XR) 10 MG 24 hr capsule, Take 10 mg by mouth every morning. Not taken regularly-makes him feel nervous and jittery., Disp: , Rfl:    anastrozole (ARIMIDEX) 1 MG tablet, Take by mouth., Disp: , Rfl:    aspirin EC 81 MG tablet, Take 1 tablet (81 mg total) by mouth daily. Swallow whole., Disp: 90 tablet, Rfl: 3   dorzolamide-timolol (COSOPT) 2-0.5 % ophthalmic solution, Place 1 drop into both eyes 2 (two) times daily., Disp: , Rfl:    finasteride (PROPECIA) 1 MG tablet, Take 1 tablet (1 mg total) by mouth daily., Disp: 90 tablet, Rfl: 3   Multiple Vitamins-Iron (MULTIVITAMIN/IRON) TABS, Take 1 tablet by mouth daily., Disp: , Rfl:    NASONEX 50 MCG/ACT nasal spray, PLACE 2 SQUIRTS IN EACH NOSTRIL DAILY, Disp: 17 g, Rfl: 11    Semaglutide-Weight Management (WEGOVY) 0.5 MG/0.5ML SOAJ, Inject 0.5 mg into the skin once a week., Disp: 2 mL, Rfl: 0   sulfaSALAzine (AZULFIDINE) 500 MG tablet, TAKE 3 TABLETS BY MOUTH 2 TIMES A DAY., Disp: 540 tablet, Rfl: 3   TESTOSTERONE ENANTHATE IJ, Inject as directed., Disp: , Rfl:    traZODone (DESYREL) 50 MG tablet, Take 0.5-1 tablets (25-50 mg total) by mouth at bedtime as needed for sleep., Disp: 90 tablet, Rfl: 2   rosuvastatin (CRESTOR) 40 MG tablet, Take 1 tablet (40 mg total) by mouth daily., Disp: 90 tablet, Rfl: 3  Review of Systems:   ROS See pertinent positives and negatives as per the HPI.  Vitals:   Vitals:   06/21/23 1018  BP: 130/74  Pulse: 81  Temp: 97.8 F (36.6 C)  TempSrc: Temporal  SpO2: 94%  Weight: 291 lb (132 kg)  Height: 6\' 1"  (1.854 m)     Body mass index is 38.39 kg/m.  Physical Exam:   Physical Exam Vitals and nursing note reviewed.  Constitutional:      General: He is not in acute distress.    Appearance: He is well-developed. He is not ill-appearing or toxic-appearing.  Cardiovascular:     Rate and Rhythm: Normal rate and regular rhythm.     Pulses: Normal pulses.     Heart sounds: Normal heart sounds, S1 normal and S2 normal.  Pulmonary:     Effort: Pulmonary effort is normal.     Breath sounds: Normal breath sounds.  Abdominal:     Tenderness: There is abdominal tenderness (point tenderness to right middle abdominal quadrant). There is no guarding or rebound.  Skin:    General: Skin is warm and dry.  Neurological:     Mental Status: He is alert.     GCS: GCS eye subscore is 4. GCS verbal subscore is 5. GCS motor subscore is 6.  Psychiatric:        Speech: Speech normal.        Behavior: Behavior normal. Behavior is cooperative.     Assessment and Plan:   Right sided abdominal pain No red flags No evidence of acute abdomen on my exam Suspect possible constipation with [facility]  However differential diagnosis includes but  is not limited to, liver/gallbladder/pancreatic dysfunction, colitis, or other Low suspicion for appendicitis given chronicity and improvement Will order blood work and UA to assess further and order KUB to assess for stool burden Low threshold to obtain additional imaging/work-up if any worsening symptom(s) If any acutely worsening symptom(s), recommend ER  May need to consider holding  Wegovy if constipation is cause and symptom(s) do not improve  Need for immunization against influenza Completed   I,Emily Lagle,acting as a scribe for [employer], PA.,have documented all relevant documentation on the behalf of Jarold Motto, PA,as directed by  Jarold Motto, PA while in the presence of Jarold Motto, [place].  I, Jarold Motto, [place], have reviewed all documentation for this visit. The documentation on 06/21/23 for the exam, diagnosis, procedures, and orders are all accurate and complete.  Jarold Motto, PA-C

## 2023-06-22 ENCOUNTER — Ambulatory Visit
Admission: RE | Admit: 2023-06-22 | Discharge: 2023-06-22 | Disposition: A | Discharge status: Home / Self Care | Payer: Medicare HMO | Source: Ambulatory Visit | Attending: Physician Assistant

## 2023-06-22 DIAGNOSIS — R109 Unspecified abdominal pain: Secondary | ICD-10-CM

## 2023-06-22 DIAGNOSIS — N2889 Other specified disorders of kidney and ureter: Secondary | ICD-10-CM | POA: Diagnosis not present

## 2023-06-26 ENCOUNTER — Telehealth: Payer: Self-pay | Admitting: Pharmacist

## 2023-06-26 ENCOUNTER — Other Ambulatory Visit (HOSPITAL_COMMUNITY): Payer: Self-pay

## 2023-06-26 ENCOUNTER — Encounter: Payer: Self-pay | Admitting: Student

## 2023-06-26 ENCOUNTER — Telehealth: Payer: Self-pay | Admitting: Pharmacy Technician

## 2023-06-26 ENCOUNTER — Ambulatory Visit: Payer: Medicare HMO | Attending: Internal Medicine | Admitting: Student

## 2023-06-26 DIAGNOSIS — E785 Hyperlipidemia, unspecified: Secondary | ICD-10-CM | POA: Diagnosis not present

## 2023-06-26 MED ORDER — REPATHA SURECLICK 140 MG/ML ~~LOC~~ SOAJ: 140.0000 mg | SUBCUTANEOUS | 3 refills | Status: DC

## 2023-06-26 NOTE — Patient Instructions (Addendum)
Your Results:             Your most recent labs Goal  Total Cholesterol 148 < 200  Triglycerides 175 < 150  HDL (happy/good cholesterol) 51 > 40  LDL (lousy/bad cholesterol 68 < 55   Medication changes: start taking Repatha 140 mg every 14 days and continue taking Crestor 40 mg daily     Praluent is a cholesterol medication that improved your body's ability to get rid of "bad cholesterol" known as LDL. It can lower your LDL up to 60%. It is an injection that is given under the skin every 2 weeks. The most common side effects of Praluent include runny nose, symptoms of the common cold, rarely flu or flu-like symptoms, back/muscle pain in about 3-4% of the patients, and redness, pain, or bruising at the injection site.    Repatha is a cholesterol medication that improved your body's ability to get rid of "bad cholesterol" known as LDL. It can lower your LDL up to 60%! It is an injection that is given under the skin every 2 weeks. The most common side effects of Repatha include runny nose, symptoms of the common cold, rarely flu or flu-like symptoms, back/muscle pain in about 3-4% of the patients, and redness, pain, or bruising at the injection site.   Lab orders: We want to repeat labs after 2-3 months (Oct 04, 2023).  We will send you a lab order to remind you once we get closer to that time.

## 2023-06-26 NOTE — Progress Notes (Unsigned)
Patient ID: Richard [name]                 DOB: 10/27/1955                    MRN: [id number]      HPI: Dimitrious Tunis is a 67 y.o. male patient referred to lipid clinic by Dr.Thukkani. PMH is significant for hypertension, OSA, Obesity - on [facility]   Patient presented today for lipid clinic. Reports he has been tolerating Crestor 40 mg well but when he added Zetia 10 mg daily he started having muscle pain so he had stopped taking it. He has strong family hx of ASCVD his father died from cancer but he had his 1st bypass in his early 92's. He appetite has gone low since he is on Wegovy and he is vigilant about what he eats so he has lost almost 20 lbs on 0.25 and 0.5 mg dose of Wegovy. We reviewed options for lowering LDL cholesterol, including  PCSK-9 inhibitors, bempedoic acid and inclisiran.  Discussed mechanisms of action, dosing, side effects and potential decreases in LDL cholesterol.  Also reviewed cost information and potential options for patient assistance.    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 Mother died from cancer   Social History:  Alcohol: 1-3 drinks per week Smoking: quit 20 years ago Recreational drug use: never   Labs: Lipid Panel     Component Value Date/Time   CHOL 148 05/31/2023 0835   TRIG 172 (H) 05/31/2023 0835   TRIG 112 08/14/2006 0919   HDL 51 05/31/2023 0835   CHOLHDL 2.9 05/31/2023 0835   CHOLHDL 6 01/11/2023 0924   VLDL 45.2 (H) 01/11/2023 0924   LDLCALC 68 05/31/2023 0835   LDLDIRECT 188.0 01/11/2023 0924   LABVLDL 29 05/31/2023 0835     Past Medical History:  Diagnosis Date   ADHD (attention deficit hyperactivity disorder) 07/25/2022   ALLERGIC RHINITIS CAUSE UNSPECIFIED 05/21/2009   Allergy    BACK PAIN, LUMBAR, WITH RADICULOPATHY 03/18/2010   COLITIS, ULCERATIVE 1997   Coronary artery calcification seen on CAT scan 01/09/2022   HYPERLIPIDEMIA 05/28/2007   HYPERTENSION 05/28/2007   HYPOGONADISM, MALE 07/24/2008   Insomnia 05/28/2007   Internal hemorrhoids    MALE PATTERN BALDNESS 05/28/2007   Recurrent major depressive disorder, 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 Not taken regularly-makes him feel nervous and jittery.     anastrozole (ARIMIDEX) 1 MG tablet Take by mouth.     aspirin EC 81 MG tablet Take 1 tablet (81 mg total) by mouth  daily. Swallow whole. 90 tablet 3   dorzolamide-timolol (COSOPT) 2-0.5 % ophthalmic solution Place 1 drop into both eyes 2 (two) times daily.     finasteride (PROPECIA) 1 MG tablet Take 1 tablet (1 mg total) by mouth daily. 90 tablet 3   Multiple Vitamins-Iron (MULTIVITAMIN/IRON) TABS Take 1 tablet by mouth daily.     NASONEX 50 MCG/ACT nasal spray PLACE 2 SQUIRTS IN EACH NOSTRIL DAILY 17 g 11   rosuvastatin (CRESTOR) 40 MG tablet Take 1 tablet (40 mg total) by mouth daily. 90 tablet 3   Semaglutide-Weight Management (WEGOVY) 0.5 MG/0.5ML SOAJ Inject 0.5 mg into the skin once a week. 2 mL 0   sulfaSALAzine (AZULFIDINE) 500 MG tablet TAKE 3 TABLETS BY MOUTH 2 TIMES A DAY. 540 tablet 3   TESTOSTERONE ENANTHATE IJ Inject as directed.     traZODone (DESYREL) 50 MG tablet Take 0.5-1  tablets (25-50 mg total) by mouth at bedtime as needed for sleep. 90 tablet 2   No current facility-administered medications on file prior to visit.    No Known Allergies  Assessment/Plan:  1. Hyperlipidemia -  Problem  Hyperlipemia   Qualifier: Diagnosis of  By: Lovell Sheehan MD, Balinda Quails     Hyperlipemia Assessment:  LDL goal: < 55 mg/dl last LDLc  68 mg/dl ( 09 /78)GN 562 goal <130  Tolerates high intensity statins well without any side effects  Intolerance to Zetia - muscle pain  Discussed next potential options (PCSK-9 inhibitors, bempedoic acid and inclisiran); cost, dosing efficacy, side effects  Reiterated importance of healthy diet and regular exercise   TG will improve with Wegovy dose titration and patient is motivated to start doing chair exercises along with 20 min everyday walks  Plan: Continue taking current medications - Crestor 40 mg daily  PA for Repatha has been approved till end of the year so will start Repatha 140 mg [place] every 2 weeks  Follow up lipid lab is due on Jan 30,2025     Thank you,  Carmela Hurt, Pharm.D [facility] of [facility] [address] [place], [place] [address]  Phone: [phone]; Fax: [phone]

## 2023-06-26 NOTE — Assessment & Plan Note (Signed)
Assessment:  LDL goal: < 55 mg/dl last LDLc  68 mg/dl ( 09 /64)QI 347 goal <425  Tolerates high intensity statins well without any side effects  Intolerance to Zetia - muscle pain  Discussed next potential options (PCSK-9 inhibitors, bempedoic acid and inclisiran); cost, dosing efficacy, side effects  Reiterated importance of healthy diet and regular exercise   TG will improve with Wegovy dose titration and patient is motivated to start doing chair exercises along with 20 min everyday walks  Plan: Continue taking current medications - Crestor 40 mg daily  PA for Repatha has been approved till end of the year so will start Repatha 140 mg [place] every 2 weeks  Follow up lipid lab is due on Jan 30,2025

## 2023-06-26 NOTE — Telephone Encounter (Signed)
Pharmacy Patient Advocate Encounter   Received notification from Pt Calls Messages that prior authorization for repatha is required/requested.   Insurance verification completed.   The patient is insured through [facility] .   Per test claim: PA required; PA submitted to [employer] [facility] via CoverMyMeds Key/confirmation #/EOC [id number] Status is pending

## 2023-06-26 NOTE — Telephone Encounter (Signed)
Pharmacy Patient Advocate Encounter  Received notification from [facility] that Prior Authorization for repatha has been APPROVED from 06/26/23 to 09/04/23. Ran test claim, Copay is $47.00- one month. This test claim was processed through [facility]- copay amounts may vary at other pharmacies due to pharmacy/plan contracts, or as the patient moves through the different stages of their insurance plan.   PA #/Case ID/Reference #: [id number]

## 2023-07-01 NOTE — Progress Notes (Unsigned)
Cardiology Office Note:   Date:  07/04/2023  ID:  Richard [name], DOB 1956-08-22, MRN [id number] PCP:  Ardith Dark, MD  [facility] Providers Cardiologist:  Alverda Skeans, MD Referring MD: Ardith Dark, MD  Chief Complaint/Reason for Referral: Cardiology follow-up ASSESSMENT:    1. Coronary artery calcification seen on CAT scan   2. Aortic atherosclerosis (HCC)   3. Aneurysm of ascending aorta without rupture (HCC)   4. Essential hypertension   5. Elevated lipoprotein(a)   6. BMI 39.0-39.9,adult   7. Obstructive sleep apnea     PLAN:   In order of problems listed above: Coronary artery calcification: Continue aspirin, Crestor 40 mg, Repatha, and blood pressure control. Aortic atherosclerosis: See 1 above. Aortic aneurysm: CT 2024 demonstrated only mild dilatation with a diameter of 4.0 cm; will obtain CTA in 1 year and if stable will go to every 2-year imaging Hypertension: Blood pressure is well-controlled on his current regimen. Elevated LP(a): On Repatha and Crestor; last LDL in September of this year was 29.  This is being followed by pharmacy. Elevated BMI: Continue Wegovy Obstructive sleep apnea: Continue CPAP.      Dispo:  Return in about 7 months (around 02/01/2024).     Medication Adjustments/Labs and Tests Ordered: Current medicines are reviewed at length with the patient today.  Concerns regarding medicines are outlined above.  The following changes have been made:  no change   Labs/tests ordered: Orders Placed This Encounter  Procedures   EKG 12-Lead    Medication Changes: No orders of the defined types were placed in this encounter.   Current medicines are reviewed at length with the patient today.  The patient does not have concerns regarding medicines.  I spent 32 minutes reviewing all clinical data during and prior to this visit including all relevant imaging studies, laboratories, clinical information from other health systems, and  prior notes from both Cardiology and other specialties, interviewing the patient, and conducting a complete physical examination in order to formulate a comprehensive and personalized evaluation and treatment plan.  History of Present Illness:      FOCUSED PROBLEM LIST:   Elevated coronary artery calcification Calcium score CT 2023 Intolerant of Toprol Aortic atherosclerosis Calcium score CT 2023 Ascending aortic aneurysm 4.4 cm CT 2023  4.0 cm CT 2024 Hypertension Hyperlipidemia LP(a) 284 Ulcerative colitis Obstructive sleep apnea on CPAP BMI 39 Right bundle branch block EKG 2024  May 2023:  The patient is a 67 y.o. male with the indicated medical history here for recommendations regarding elevated calcium score.  He was seen by his primary care provider in the last few months and was doing well.  He was referred for calcium score for screening purposes and this demonstrated elevated coronary calcium levels.   The patient works full-time as a very Fish farm manager.  He is under a lot of stress.  He denies any exertional angina or dyspnea.  He used to work out at [employer] prior to him contracting COVID back in the fall.  At that time he was working out on a treadmill and exercising with weights.  He does not smoke.  He does not complain of any presyncope or syncope.  He denies any palpitations or paroxysmal nocturnal dyspnea.  He has required no emergency room visits or hospitalizations.  He is otherwise well without significant complaints.  Plan: Start aspirin 81 mg, Toprol 12.5 mg at bedtime, increase Crestor to 40 mg and check lipid panel, LFTs, LP(a) in  2 months; CT scan in 1 year for aortic aneurysm.  Refer to pharmacy for recommendations regarding weight loss therapy.  October 2024: In the interim he was seen by the pharmacy division and started on Wegovy and Repatha.  He has lost about 15 to 20 pounds.  He feels better.  He is less short of breath.  He denies any chest pain.  He had  issues with the Toprol that was started last year which resulted in fatigue.  He also had issues with atorvastatin.  He said no issues with rosuvastatin.  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  He has no plans to slow down at this point.  He is otherwise feeling well and is without complaints today.          Current Medications: Current Meds  Medication Sig   amLODipine-benazepril (LOTREL) 10-40 MG capsule TAKE 1 CAPSULE BY MOUTH EVERY DAY   amphetamine-dextroamphetamine (ADDERALL XR) 10 MG 24 hr capsule Take 10 mg by mouth every morning. Not taken regularly-makes him feel nervous and jittery.   aspirin EC 81 MG tablet Take 1 tablet (81 mg total) by mouth daily. Swallow whole.   dorzolamide-timolol (COSOPT) 2-0.5 % ophthalmic solution Place 1 drop into both eyes 2 (two) times daily.   Evolocumab (REPATHA SURECLICK) 140 MG/ML SOAJ Inject 140 mg into the skin every 14 (fourteen) days.   finasteride (PROPECIA) 1 MG tablet Take 1 tablet (1 mg total) by mouth daily.   Multiple Vitamins-Iron (MULTIVITAMIN/IRON) TABS Take 1 tablet by mouth daily.   NASONEX 50 MCG/ACT nasal spray PLACE 2 SQUIRTS IN EACH NOSTRIL DAILY   Semaglutide-Weight Management (WEGOVY) 0.5 MG/0.5ML SOAJ Inject 0.5 mg into the skin once a week.   sulfaSALAzine (AZULFIDINE) 500 MG tablet TAKE 3 TABLETS BY MOUTH 2 TIMES A DAY.   traZODone (DESYREL) 50 MG tablet Take 0.5-1 tablets (25-50 mg total) by mouth at bedtime as needed for sleep.   [DISCONTINUED] ezetimibe (ZETIA) 10 MG tablet Take 10 mg by mouth daily.     Review of Systems:   Please see the history of present illness.    All other systems reviewed and are negative.     EKGs/Labs/Other Test Reviewed:   EKG: EKG May 2023 that I personally reviewed demonstrates normal sinus rhythm  EKG Interpretation Date/Time:  Wednesday July 04 2023 14:47:29 EDT Ventricular Rate:  82 PR Interval:  200 QRS Duration:  142 QT Interval:  410 QTC  Calculation: 479 R Axis:   72  Text Interpretation: Normal sinus rhythm Right bundle branch block Compared to previous tracing Right bundle branch block is new Confirmed by Alverda Skeans ([id number]) on 07/04/2023 2:52:22 PM         Risk Assessment/Calculations:          Physical Exam:   VS:  BP 130/70   Pulse 83   Ht 6\' 1"  (1.854 m)   Wt 284 lb 3.2 oz (128.9 kg)   SpO2 95%   BMI 37.50 kg/m        Wt Readings from Last 3 Encounters:  07/04/23 284 lb 3.2 oz (128.9 kg)  06/21/23 291 lb (132 kg)  05/31/23 294 lb (133.4 kg)      GENERAL:  No apparent distress, AOx3 HEENT:  No carotid bruits, +2 carotid impulses, no scleral icterus CAR: RRR no murmurs, gallops, rubs, or thrills RES:  Clear to auscultation bilaterally ABD:  Soft, nontender, nondistended, positive bowel sounds x 4 VASC:  +  2 radial pulses, +2 carotid pulses NEURO:  CN 2-12 grossly intact; motor and sensory grossly intact PSYCH:  No active depression or anxiety EXT:  No edema, ecchymosis, or cyanosis  Signed, Orbie Pyo, MD  07/04/2023 3:39 PM    [facility] [address] [place], [place], [place]  [address] Phone: [phone]; Fax: [phone]   Note:  This document was prepared using Dragon voice recognition software and may include unintentional dictation errors.

## 2023-07-04 ENCOUNTER — Encounter: Payer: Self-pay | Admitting: Internal Medicine

## 2023-07-04 ENCOUNTER — Ambulatory Visit: Payer: Medicare HMO | Attending: Internal Medicine | Admitting: Internal Medicine

## 2023-07-04 ENCOUNTER — Other Ambulatory Visit (HOSPITAL_COMMUNITY): Payer: Self-pay

## 2023-07-04 VITALS — BP 130/70 | HR 83 | Ht 73.0 in | Wt 284.2 lb

## 2023-07-04 DIAGNOSIS — I7121 Aneurysm of the ascending aorta, without rupture: Secondary | ICD-10-CM | POA: Diagnosis not present

## 2023-07-04 DIAGNOSIS — E7841 Elevated Lipoprotein(a): Secondary | ICD-10-CM

## 2023-07-04 DIAGNOSIS — G4733 Obstructive sleep apnea (adult) (pediatric): Secondary | ICD-10-CM

## 2023-07-04 DIAGNOSIS — Z6839 Body mass index (BMI) 39.0-39.9, adult: Secondary | ICD-10-CM

## 2023-07-04 DIAGNOSIS — I2510 Atherosclerotic heart disease of native coronary artery without angina pectoris: Secondary | ICD-10-CM

## 2023-07-04 DIAGNOSIS — I10 Essential (primary) hypertension: Secondary | ICD-10-CM

## 2023-07-04 DIAGNOSIS — I700 Atherosclerosis of aorta: Secondary | ICD-10-CM | POA: Diagnosis not present

## 2023-07-04 MED ORDER — ROSUVASTATIN CALCIUM 40 MG PO TABS
40.0000 mg | ORAL_TABLET | Freq: Every day | ORAL | 3 refills | Status: DC
  Filled 2023-07-04: qty 90, 90d supply, fill #0
  Filled 2023-10-03: qty 90, 90d supply, fill #1
  Filled 2024-01-01: qty 90, 90d supply, fill #2
  Filled 2024-04-11: qty 90, 90d supply, fill #3

## 2023-07-04 NOTE — Patient Instructions (Signed)
Medication Instructions:  No changes *If you need a refill on your cardiac medications before your next appointment, please call your pharmacy*   Lab Work: None today   Testing/Procedures: Chest CT angiogram (aorta) - due in May 2025 - before next visit with Dr. Lynnette Caffey   Follow-Up: At [facility], you and your health needs are our priority.  As part of our continuing mission to provide you with exceptional heart care, we have created designated Provider Care Teams.  These Care Teams include your primary Cardiologist (physician) and Advanced Practice Providers (APPs -  Physician Assistants and Nurse Practitioners) who all work together to provide you with the care you need, when you need it.   Your next appointment:   6 month(s)  Provider:   Orbie Pyo, MD  - to be seen after the next chest CT angiogram (aorta) in May 2025

## 2023-07-04 NOTE — Addendum Note (Signed)
Addended by: Lendon Ka on: 07/04/2023 04:54 PM   Modules accepted: Orders

## 2023-07-09 NOTE — Telephone Encounter (Signed)
PA request has been Approved. New Encounter created for follow up. For additional info see Pharmacy Prior Auth telephone encounter from 06/26/23

## 2023-07-10 ENCOUNTER — Other Ambulatory Visit (HOSPITAL_COMMUNITY): Payer: Self-pay

## 2023-07-11 ENCOUNTER — Encounter: Payer: Self-pay | Admitting: Pharmacist

## 2023-07-11 DIAGNOSIS — H25811 Combined forms of age-related cataract, right eye: Secondary | ICD-10-CM | POA: Diagnosis not present

## 2023-07-11 DIAGNOSIS — H268 Other specified cataract: Secondary | ICD-10-CM | POA: Diagnosis not present

## 2023-07-11 DIAGNOSIS — H2511 Age-related nuclear cataract, right eye: Secondary | ICD-10-CM | POA: Diagnosis not present

## 2023-07-16 ENCOUNTER — Other Ambulatory Visit (HOSPITAL_COMMUNITY): Payer: Self-pay

## 2023-07-16 ENCOUNTER — Other Ambulatory Visit: Payer: Self-pay

## 2023-07-16 MED ORDER — WEGOVY 1 MG/0.5ML ~~LOC~~ SOAJ
1.0000 mg | SUBCUTANEOUS | 0 refills | Status: DC
  Filled 2023-07-16: qty 2, 28d supply, fill #0

## 2023-07-17 ENCOUNTER — Other Ambulatory Visit: Payer: Self-pay

## 2023-07-17 ENCOUNTER — Other Ambulatory Visit (HOSPITAL_COMMUNITY): Payer: Self-pay

## 2023-07-25 ENCOUNTER — Other Ambulatory Visit (HOSPITAL_COMMUNITY): Payer: Self-pay

## 2023-07-25 ENCOUNTER — Other Ambulatory Visit: Payer: Self-pay | Admitting: *Deleted

## 2023-07-25 ENCOUNTER — Encounter: Payer: Self-pay | Admitting: Family Medicine

## 2023-07-25 ENCOUNTER — Other Ambulatory Visit: Payer: Self-pay

## 2023-07-25 MED ORDER — FINASTERIDE 1 MG PO TABS
1.0000 mg | ORAL_TABLET | Freq: Every day | ORAL | 3 refills | Status: DC
  Filled 2023-07-25: qty 90, 90d supply, fill #0
  Filled 2023-10-31: qty 90, 90d supply, fill #1
  Filled 2024-01-31 – 2024-02-19 (×2): qty 90, 90d supply, fill #2
  Filled 2024-05-23: qty 90, 90d supply, fill #3

## 2023-08-10 ENCOUNTER — Encounter: Payer: Self-pay | Admitting: Family Medicine

## 2023-08-10 NOTE — Telephone Encounter (Signed)
Please schedule an office visit with PCP to discuss medication

## 2023-08-13 ENCOUNTER — Encounter: Payer: Self-pay | Admitting: Pharmacist

## 2023-08-13 ENCOUNTER — Other Ambulatory Visit (HOSPITAL_COMMUNITY): Payer: Self-pay

## 2023-08-13 ENCOUNTER — Other Ambulatory Visit: Payer: Self-pay

## 2023-08-13 MED ORDER — WEGOVY 1.7 MG/0.75ML ~~LOC~~ SOAJ
1.7000 mg | SUBCUTANEOUS | 0 refills | Status: DC
  Filled 2023-08-13: qty 3, 28d supply, fill #0

## 2023-08-13 NOTE — Addendum Note (Signed)
Addended by: Malena Peer D on: 08/13/2023 03:13 PM   Modules accepted: Orders

## 2023-08-14 ENCOUNTER — Other Ambulatory Visit (HOSPITAL_COMMUNITY): Payer: Self-pay

## 2023-08-16 ENCOUNTER — Encounter: Payer: Self-pay | Admitting: Family Medicine

## 2023-08-16 ENCOUNTER — Other Ambulatory Visit (HOSPITAL_COMMUNITY): Payer: Self-pay

## 2023-08-16 ENCOUNTER — Other Ambulatory Visit: Payer: Self-pay

## 2023-08-16 ENCOUNTER — Ambulatory Visit (INDEPENDENT_AMBULATORY_CARE_PROVIDER_SITE_OTHER): Payer: Medicare HMO | Admitting: Family Medicine

## 2023-08-16 VITALS — BP 133/78 | HR 75 | Temp 97.5°F | Ht 73.0 in | Wt 276.4 lb

## 2023-08-16 DIAGNOSIS — F3342 Major depressive disorder, recurrent, in full remission: Secondary | ICD-10-CM | POA: Diagnosis not present

## 2023-08-16 DIAGNOSIS — I10 Essential (primary) hypertension: Secondary | ICD-10-CM

## 2023-08-16 MED ORDER — ESCITALOPRAM OXALATE 10 MG PO TABS
10.0000 mg | ORAL_TABLET | Freq: Every day | ORAL | 3 refills | Status: DC
  Filled 2023-08-16: qty 90, 90d supply, fill #0

## 2023-08-16 NOTE — Assessment & Plan Note (Signed)
Initially elevated however at goal on recheck.  Will continue amlodipine benazepril 10-40 once daily.  He can monitor at home and let us know if persistently elevated.

## 2023-08-16 NOTE — Patient Instructions (Signed)
It was very nice to see you today!  We will restart Lexapro.  Send me a message in a few weeks to let me know how you are doing.  Let us know if you need a referral to see a therapist.  Take care, Dr Jimmey Ralph  PLEASE NOTE:  If you had any lab tests, please let us know if you have not heard back within a few days. You may see your results on mychart before we have a chance to review them but we will give you a call once they are reviewed by [place].   If we ordered any referrals today, please let us know if you have not heard from their office within the next week.   If you had any urgent prescriptions sent in today, please check with the pharmacy within an hour of our visit to make sure the prescription was transmitted appropriately.   Please try these tips to maintain a healthy lifestyle:  Eat at least 3 REAL meals and 1-2 snacks per day.  Aim for no more than 5 hours between eating.  If you eat breakfast, please do so within one hour of getting up.   Each meal should contain half fruits/vegetables, one quarter protein, and one quarter carbs (no bigger than a computer mouse)  Cut down on sweet beverages. This includes juice, soda, and sweet tea.   Drink at least 1 glass of water with each meal and aim for at least 8 glasses per day  Exercise at least 150 minutes every week.

## 2023-08-16 NOTE — Progress Notes (Signed)
Richard [name] is a 67 y.o. male who presents today for an office visit.  Assessment/Plan:  Chronic Problems Addressed Today: Recurrent major depressive disorder, in full remission [facility]) Patient with worsening symptoms the last couple of months due to passing of his father.  We discussed treatment options including medications versus therapy.  He did not have a great response to therapy in the past and would like to hold off on this for now however is open to keeping it in mind for the future.  For his medication management we did discuss options including Trintellix versus Lexapro versus Wellbutrin.  Given that we know he has had a good response to Lexapro in the past it would be reasonable for [place] to start this.  He has had some sexual side effects in the past with this.  Discussed with patient that if this were to happen then we could consider augmenting with Wellbutrin or switching to Trintellix however would be hesitant to start with either 1 of these today as it is not clear he would have a good response and additionally there would likely be at length the prior authorization process for his Trintellix.  He is agreeable to this plan.  He will send [place] a message in a few weeks on MyChart to let us know how he is doing and we can adjust medications as needed.  He will let us know if he changes mind about therapy.  Essential hypertension Initially elevated however at goal on recheck.  Will continue amlodipine benazepril 10-40 once daily.  He can monitor at home and let us know if persistently elevated.     Subjective:  HPI:  See Assessment / plan for status of chronic conditions.  Patient is here today for follow-up.  With last saw him 6 months ago.  His primary concern today is depression.  As father passed away a few months ago and he has had significant worsening of depressive symptoms over the last couple of months.  He is not currently on any medications though has been treated  with Lexapro for his depression in the past which did work well.  No active SI or HI.  He has been trying to manage however this has been difficult. He has previously been on Lexapro 10 mg daily and did well with this though did have some sexual side effects.  He was on Wellbutrin in the past but has not remember how he did with this.  He is also potentially interested in starting Trintellix.  He has seen therapy in the past however did not find that this was effective.     08/16/2023    9:48 AM 02/22/2023   10:07 AM 01/11/2023    8:31 AM 07/25/2022   11:26 AM 11/21/2021   10:19 AM  Depression screen PHQ 2/9  Decreased Interest 3 0 0 0 0  Down, Depressed, Hopeless 3 0 0 0 0  PHQ - 2 Score 6 0 0 0 0  Altered sleeping 3 0 0 1 1  Tired, decreased energy 3 0 0 1 1  Change in appetite 2 0 0 0 0  Feeling bad or failure about yourself  1 0 0 0 0  Trouble concentrating 3 0 1 2 1   Moving slowly or fidgety/restless 0 0 0 0 0  Suicidal thoughts 1 0 0 0 0  PHQ-9 Score 19 0 1 4 3   Difficult doing work/chores Somewhat difficult Not difficult at all Not difficult at all Not difficult  at all Not difficult at all         Objective:  Physical Exam: BP 133/78   Pulse 75   Temp (!) 97.5 F (36.4 C) (Temporal)   Ht 6\' 1"  (1.854 m)   Wt 276 lb 6.4 oz (125.4 kg)   SpO2 95%   BMI 36.47 kg/m   Gen: No acute distress, resting comfortably Neuro: Grossly normal, moves all extremities Psych: Normal affect and thought content      Arcelia Pals M. Jimmey Ralph, MD 08/16/2023 9:52 AM

## 2023-08-16 NOTE — Assessment & Plan Note (Addendum)
Patient with worsening symptoms the last couple of months due to passing of his father.  We discussed treatment options including medications versus therapy.  He did not have a great response to therapy in the past and would like to hold off on this for now however is open to keeping it in mind for the future.  For his medication management we did discuss options including Trintellix versus Lexapro versus Wellbutrin.  Given that we know he has had a good response to Lexapro in the past it would be reasonable for [place] to start this.  He has had some sexual side effects in the past with this.  Discussed with patient that if this were to happen then we could consider augmenting with Wellbutrin or switching to Trintellix however would be hesitant to start with either 1 of these today as it is not clear he would have a good response and additionally there would likely be at length the prior authorization process for his Trintellix.  He is agreeable to this plan.  He will send [place] a message in a few weeks on MyChart to let us know how he is doing and we can adjust medications as needed.  He will let us know if he changes mind about therapy.

## 2023-08-20 DIAGNOSIS — H401131 Primary open-angle glaucoma, bilateral, mild stage: Secondary | ICD-10-CM | POA: Diagnosis not present

## 2023-09-04 ENCOUNTER — Other Ambulatory Visit: Payer: Self-pay | Admitting: Family Medicine

## 2023-09-06 ENCOUNTER — Other Ambulatory Visit (HOSPITAL_BASED_OUTPATIENT_CLINIC_OR_DEPARTMENT_OTHER): Payer: Self-pay

## 2023-09-06 MED ORDER — WEGOVY 1.7 MG/0.75ML ~~LOC~~ SOAJ
1.7000 mg | SUBCUTANEOUS | 0 refills | Status: DC
  Filled 2023-09-06 – 2023-09-20 (×3): qty 3, 28d supply, fill #0

## 2023-09-06 NOTE — Addendum Note (Signed)
 Addended by: Malena Peer D on: 09/06/2023 12:21 PM   Modules accepted: Orders

## 2023-09-13 ENCOUNTER — Other Ambulatory Visit (HOSPITAL_COMMUNITY): Payer: Self-pay

## 2023-09-14 ENCOUNTER — Other Ambulatory Visit: Payer: Self-pay

## 2023-09-19 ENCOUNTER — Telehealth: Payer: Self-pay | Admitting: Pharmacy Technician

## 2023-09-19 ENCOUNTER — Other Ambulatory Visit (HOSPITAL_COMMUNITY): Payer: Self-pay

## 2023-09-19 NOTE — Telephone Encounter (Signed)
 PA renewal for wegovy  was initiated already.  Additional information has been requested from the patient's insurance in order to proceed with the prior authorization request. Requested information has been sent, or form has been filled out and faxed back

## 2023-09-20 ENCOUNTER — Other Ambulatory Visit: Payer: Self-pay

## 2023-09-20 ENCOUNTER — Encounter: Payer: Self-pay | Admitting: Pharmacist

## 2023-09-20 ENCOUNTER — Other Ambulatory Visit (HOSPITAL_COMMUNITY): Payer: Self-pay

## 2023-09-20 NOTE — Telephone Encounter (Signed)
Pharmacy Patient Advocate Encounter  Received notification from [employer] that Prior Authorization for wegovy has been APPROVED from 09/19/23 to 09/03/24   PA #/Case ID/Reference #: [id number]

## 2023-09-24 ENCOUNTER — Encounter: Payer: Self-pay | Admitting: Urology

## 2023-10-03 ENCOUNTER — Other Ambulatory Visit: Payer: Self-pay

## 2023-10-15 ENCOUNTER — Other Ambulatory Visit: Payer: Self-pay

## 2023-10-15 ENCOUNTER — Other Ambulatory Visit (HOSPITAL_COMMUNITY): Payer: Self-pay

## 2023-10-15 MED ORDER — WEGOVY 2.4 MG/0.75ML ~~LOC~~ SOAJ
2.4000 mg | SUBCUTANEOUS | 11 refills | Status: AC
  Filled 2023-10-15 (×2): qty 3, 28d supply, fill #0
  Filled 2023-11-08: qty 3, 28d supply, fill #1
  Filled 2023-12-06: qty 3, 28d supply, fill #2
  Filled 2024-01-03: qty 3, 28d supply, fill #3
  Filled 2024-01-31: qty 3, 28d supply, fill #4
  Filled 2024-02-25: qty 3, 28d supply, fill #5
  Filled 2024-03-24: qty 3, 28d supply, fill #6
  Filled 2024-04-21: qty 3, 28d supply, fill #7
  Filled 2024-05-23: qty 3, 28d supply, fill #8
  Filled 2024-06-26: qty 3, 28d supply, fill #9
  Filled 2024-07-24: qty 3, 28d supply, fill #10
  Filled 2024-08-20: qty 3, 28d supply, fill #11

## 2023-10-15 NOTE — Addendum Note (Signed)
 Addended by: Chamya Hunton D on: 10/15/2023 01:44 PM   Modules accepted: Orders

## 2023-10-16 ENCOUNTER — Other Ambulatory Visit: Payer: Self-pay

## 2023-10-26 DIAGNOSIS — E669 Obesity, unspecified: Secondary | ICD-10-CM | POA: Diagnosis not present

## 2023-10-26 DIAGNOSIS — I2510 Atherosclerotic heart disease of native coronary artery without angina pectoris: Secondary | ICD-10-CM | POA: Diagnosis not present

## 2023-10-26 DIAGNOSIS — I700 Atherosclerosis of aorta: Secondary | ICD-10-CM | POA: Diagnosis not present

## 2023-10-26 DIAGNOSIS — I719 Aortic aneurysm of unspecified site, without rupture: Secondary | ICD-10-CM | POA: Diagnosis not present

## 2023-10-26 DIAGNOSIS — Z8249 Family history of ischemic heart disease and other diseases of the circulatory system: Secondary | ICD-10-CM | POA: Diagnosis not present

## 2023-10-26 DIAGNOSIS — F325 Major depressive disorder, single episode, in full remission: Secondary | ICD-10-CM | POA: Diagnosis not present

## 2023-10-26 DIAGNOSIS — H409 Unspecified glaucoma: Secondary | ICD-10-CM | POA: Diagnosis not present

## 2023-10-26 DIAGNOSIS — Z809 Family history of malignant neoplasm, unspecified: Secondary | ICD-10-CM | POA: Diagnosis not present

## 2023-10-26 DIAGNOSIS — G4733 Obstructive sleep apnea (adult) (pediatric): Secondary | ICD-10-CM | POA: Diagnosis not present

## 2023-10-26 DIAGNOSIS — E785 Hyperlipidemia, unspecified: Secondary | ICD-10-CM | POA: Diagnosis not present

## 2023-10-26 DIAGNOSIS — K5190 Ulcerative colitis, unspecified, without complications: Secondary | ICD-10-CM | POA: Diagnosis not present

## 2023-10-26 DIAGNOSIS — Z008 Encounter for other general examination: Secondary | ICD-10-CM | POA: Diagnosis not present

## 2023-10-26 DIAGNOSIS — Z87891 Personal history of nicotine dependence: Secondary | ICD-10-CM | POA: Diagnosis not present

## 2023-10-31 ENCOUNTER — Other Ambulatory Visit (HOSPITAL_COMMUNITY): Payer: Self-pay

## 2023-10-31 ENCOUNTER — Other Ambulatory Visit: Payer: Self-pay

## 2023-10-31 ENCOUNTER — Encounter: Payer: Self-pay | Admitting: Family Medicine

## 2023-10-31 ENCOUNTER — Other Ambulatory Visit: Payer: Self-pay | Admitting: *Deleted

## 2023-10-31 MED ORDER — VORTIOXETINE HBR 10 MG PO TABS: 10.0000 mg | ORAL_TABLET | Freq: Every day | ORAL | 1 refills | Status: DC

## 2023-10-31 MED ORDER — VORTIOXETINE HBR 10 MG PO TABS
10.0000 mg | ORAL_TABLET | Freq: Every day | ORAL | 1 refills | Status: DC
  Filled 2023-10-31 – 2023-12-06 (×2): qty 30, 30d supply, fill #0
  Filled 2024-01-01: qty 30, 30d supply, fill #1

## 2023-10-31 NOTE — Telephone Encounter (Signed)
 Okay to send in Trintellix 10 mg daily.  He should follow-up with [place] in a few weeks.

## 2023-10-31 NOTE — Telephone Encounter (Signed)
**Note De-identified  [place] Obfuscation** Please advise 

## 2023-11-07 ENCOUNTER — Other Ambulatory Visit (HOSPITAL_COMMUNITY): Payer: Self-pay

## 2023-11-07 ENCOUNTER — Telehealth: Payer: Self-pay

## 2023-11-07 NOTE — Telephone Encounter (Signed)
 Pharmacy Patient Advocate Encounter   Received notification from CoverMyMeds that prior authorization for Trintellix 10mg  tablet is required/requested.   Insurance verification completed.   The patient is insured through [facility] .   Per test claim: PA required; PA submitted to above mentioned insurance via CoverMyMeds Key/confirmation #/EOC [id number] Status is pending

## 2023-11-08 ENCOUNTER — Other Ambulatory Visit (HOSPITAL_COMMUNITY): Payer: Self-pay

## 2023-11-08 ENCOUNTER — Other Ambulatory Visit: Payer: Self-pay

## 2023-11-08 NOTE — Telephone Encounter (Signed)
 Pharmacy Patient Advocate Encounter  Received notification from [facility] that Prior Authorization for Trintellix 10MG  TABLETS has been APPROVED from 09/05/2023 to 09/03/2024.   PA # [id number] KEY #:[id number]

## 2023-11-21 ENCOUNTER — Other Ambulatory Visit: Payer: Self-pay

## 2023-11-21 ENCOUNTER — Other Ambulatory Visit (HOSPITAL_COMMUNITY): Payer: Self-pay

## 2023-11-22 ENCOUNTER — Encounter: Payer: Self-pay | Admitting: Family Medicine

## 2023-12-06 ENCOUNTER — Other Ambulatory Visit: Payer: Self-pay

## 2023-12-06 ENCOUNTER — Other Ambulatory Visit (HOSPITAL_COMMUNITY): Payer: Self-pay

## 2024-01-01 ENCOUNTER — Other Ambulatory Visit (HOSPITAL_COMMUNITY): Payer: Self-pay

## 2024-01-03 ENCOUNTER — Other Ambulatory Visit: Payer: Self-pay

## 2024-01-07 ENCOUNTER — Ambulatory Visit (HOSPITAL_BASED_OUTPATIENT_CLINIC_OR_DEPARTMENT_OTHER)
Admission: RE | Admit: 2024-01-07 | Discharge: 2024-01-07 | Disposition: A | Discharge status: Home / Self Care | Payer: Medicare HMO | Source: Ambulatory Visit | Attending: Internal Medicine | Admitting: Internal Medicine

## 2024-01-07 ENCOUNTER — Encounter (HOSPITAL_BASED_OUTPATIENT_CLINIC_OR_DEPARTMENT_OTHER): Payer: Self-pay

## 2024-01-07 DIAGNOSIS — I7121 Aneurysm of the ascending aorta, without rupture: Secondary | ICD-10-CM | POA: Insufficient documentation

## 2024-01-07 DIAGNOSIS — I77810 Thoracic aortic ectasia: Secondary | ICD-10-CM | POA: Diagnosis not present

## 2024-01-07 DIAGNOSIS — E042 Nontoxic multinodular goiter: Secondary | ICD-10-CM | POA: Diagnosis not present

## 2024-01-07 LAB — POCT I-STAT CREATININE: Creatinine, Ser: 0.8 mg/dL (ref 0.61–1.24)

## 2024-01-07 MED ORDER — IOHEXOL 350 MG/ML SOLN
100.0000 mL | Freq: Once | INTRAVENOUS | Status: AC | PRN
  Administered 2024-01-07: 100 mL via INTRAVENOUS

## 2024-01-15 ENCOUNTER — Other Ambulatory Visit (HOSPITAL_COMMUNITY): Payer: Self-pay

## 2024-01-15 ENCOUNTER — Ambulatory Visit: Payer: 59 | Admitting: Family Medicine

## 2024-01-15 ENCOUNTER — Encounter: Payer: Self-pay | Admitting: Family Medicine

## 2024-01-15 ENCOUNTER — Other Ambulatory Visit: Payer: Self-pay

## 2024-01-15 VITALS — BP 132/79 | HR 75 | Temp 98.1°F | Ht 73.0 in | Wt 252.6 lb

## 2024-01-15 DIAGNOSIS — G4700 Insomnia, unspecified: Secondary | ICD-10-CM | POA: Diagnosis not present

## 2024-01-15 DIAGNOSIS — Z0001 Encounter for general adult medical examination with abnormal findings: Secondary | ICD-10-CM | POA: Diagnosis not present

## 2024-01-15 DIAGNOSIS — E6609 Other obesity due to excess calories: Secondary | ICD-10-CM | POA: Diagnosis not present

## 2024-01-15 DIAGNOSIS — I10 Essential (primary) hypertension: Secondary | ICD-10-CM

## 2024-01-15 DIAGNOSIS — F3342 Major depressive disorder, recurrent, in full remission: Secondary | ICD-10-CM

## 2024-01-15 DIAGNOSIS — Z7251 High risk heterosexual behavior: Secondary | ICD-10-CM | POA: Diagnosis not present

## 2024-01-15 DIAGNOSIS — E785 Hyperlipidemia, unspecified: Secondary | ICD-10-CM

## 2024-01-15 DIAGNOSIS — L649 Androgenic alopecia, unspecified: Secondary | ICD-10-CM | POA: Diagnosis not present

## 2024-01-15 DIAGNOSIS — E66812 Obesity, class 2: Secondary | ICD-10-CM | POA: Diagnosis not present

## 2024-01-15 DIAGNOSIS — K5100 Ulcerative (chronic) pancolitis without complications: Secondary | ICD-10-CM

## 2024-01-15 DIAGNOSIS — Z131 Encounter for screening for diabetes mellitus: Secondary | ICD-10-CM | POA: Diagnosis not present

## 2024-01-15 DIAGNOSIS — R972 Elevated prostate specific antigen [PSA]: Secondary | ICD-10-CM

## 2024-01-15 LAB — CBC
HCT: 44.9 % (ref 39.0–52.0)
Hemoglobin: 15.1 g/dL (ref 13.0–17.0)
MCHC: 33.7 g/dL (ref 30.0–36.0)
MCV: 94.4 fl (ref 78.0–100.0)
Platelets: 221 10*3/uL (ref 150.0–400.0)
RBC: 4.75 Mil/uL (ref 4.22–5.81)
RDW: 13.1 % (ref 11.5–15.5)
WBC: 6 10*3/uL (ref 4.0–10.5)

## 2024-01-15 LAB — LIPID PANEL
Cholesterol: 125 mg/dL (ref 0–200)
HDL: 71.5 mg/dL (ref >39.00)
LDL Cholesterol: 34 mg/dL (ref 0–99)
NonHDL: 53.7
Total CHOL/HDL Ratio: 2
Triglycerides: 100 mg/dL (ref 0.0–149.0)
VLDL: 20 mg/dL (ref 0.0–40.0)

## 2024-01-15 LAB — HEMOGLOBIN A1C: Hgb A1c MFr Bld: 4.8 % (ref 4.6–6.5)

## 2024-01-15 LAB — PSA: PSA: 2.3 ng/mL (ref 0.10–4.00)

## 2024-01-15 LAB — COMPREHENSIVE METABOLIC PANEL WITH GFR
ALT: 21 U/L (ref 0–53)
AST: 18 U/L (ref 0–37)
Albumin: 4.5 g/dL (ref 3.5–5.2)
Alkaline Phosphatase: 53 U/L (ref 39–117)
BUN: 14 mg/dL (ref 6–23)
CO2: 27 meq/L (ref 19–32)
Calcium: 9.3 mg/dL (ref 8.4–10.5)
Chloride: 102 meq/L (ref 96–112)
Creatinine, Ser: 0.72 mg/dL (ref 0.40–1.50)
GFR: 95.51 mL/min (ref >60.00)
Glucose, Bld: 104 mg/dL — ABNORMAL HIGH (ref 70–99)
Potassium: 4.2 meq/L (ref 3.5–5.1)
Sodium: 138 meq/L (ref 135–145)
Total Bilirubin: 0.5 mg/dL (ref 0.2–1.2)
Total Protein: 6.7 g/dL (ref 6.0–8.3)

## 2024-01-15 LAB — TSH: TSH: 1.34 u[IU]/mL (ref 0.35–5.50)

## 2024-01-15 MED ORDER — VORTIOXETINE HBR 20 MG PO TABS
20.0000 mg | ORAL_TABLET | Freq: Every day | ORAL | 3 refills | Status: AC
  Filled 2024-01-15: qty 90, 90d supply, fill #0
  Filled 2024-04-11: qty 90, 90d supply, fill #1
  Filled 2024-07-24: qty 90, 90d supply, fill #2

## 2024-01-15 MED ORDER — TRAZODONE HCL 50 MG PO TABS
25.0000 mg | ORAL_TABLET | Freq: Every evening | ORAL | 2 refills | Status: AC | PRN
  Filled 2024-01-15: qty 90, 90d supply, fill #0

## 2024-01-15 NOTE — Assessment & Plan Note (Signed)
Stable on trazodone 25 to 50 mg nightly.  Will refill today.

## 2024-01-15 NOTE — Patient Instructions (Signed)
 It was very nice to see you today!  We will check blood work today.  I will send a prescription in for Trintellix  20 mg daily.  Please follow-up with me in a few weeks to let us  know how this is working.  Please send me a message if you are interested in starting Truvada.  Please continue to work on diet and exercise.  I will see back in year for your next physical.  Come back sooner if needed.  Return in about 1 year (around 01/14/2025) for Annual Physical.   Take care, Dr Daneil Dunker  PLEASE NOTE:  If you had any lab tests, please let us  know if you have not heard back within a few days. You may see your results on mychart before we have a chance to review them but we will give you a call once they are reviewed by us .   If we ordered any referrals today, please let us  know if you have not heard from their office within the next week.   If you had any urgent prescriptions sent in today, please check with the pharmacy within an hour of our visit to make sure the prescription was transmitted appropriately.   Please try these tips to maintain a healthy lifestyle:  Eat at least 3 REAL meals and 1-2 snacks per day.  Aim for no more than 5 hours between eating.  If you eat breakfast, please do so within one hour of getting up.   Each meal should contain half fruits/vegetables, one quarter protein, and one quarter carbs (no bigger than a computer mouse)  Cut down on sweet beverages. This includes juice, soda, and sweet tea.   Drink at least 1 glass of water with each meal and aim for at least 8 glasses per day  Exercise at least 150 minutes every week.    Preventive Care 29 Years and Older, Male Preventive care refers to lifestyle choices and visits with your health care provider that can promote health and wellness. Preventive care visits are also called wellness exams. What can I expect for my preventive care visit? Counseling During your preventive care visit, your health care provider  may ask about your: Medical history, including: Past medical problems. Family medical history. History of falls. Current health, including: Emotional well-being. Home life and relationship well-being. Sexual activity. Memory and ability to understand (cognition). Lifestyle, including: Alcohol, nicotine or tobacco, and drug use. Access to firearms. Diet, exercise, and sleep habits. Work and work Astronomer. Sunscreen use. Safety issues such as seatbelt and bike helmet use. Physical exam Your health care provider will check your: Height and weight. These may be used to calculate your BMI (body mass index). BMI is a measurement that tells if you are at a healthy weight. Waist circumference. This measures the distance around your waistline. This measurement also tells if you are at a healthy weight and may help predict your risk of certain diseases, such as type 2 diabetes and high blood pressure. Heart rate and blood pressure. Body temperature. Skin for abnormal spots. What immunizations do I need?  Vaccines are usually given at various ages, according to a schedule. Your health care provider will recommend vaccines for you based on your age, medical history, and lifestyle or other factors, such as travel or where you work. What tests do I need? Screening Your health care provider may recommend screening tests for certain conditions. This may include: Lipid and cholesterol levels. Diabetes screening. This is done by checking your  blood sugar (glucose) after you have not eaten for a while (fasting). Hepatitis C test. Hepatitis B test. HIV (human immunodeficiency virus) test. STI (sexually transmitted infection) testing, if you are at risk. Lung cancer screening. Colorectal cancer screening. Prostate cancer screening. Abdominal aortic aneurysm (AAA) screening. You may need this if you are a current or former smoker. Talk with your health care provider about your test results,  treatment options, and if necessary, the need for more tests. Follow these instructions at home: Eating and drinking  Eat a diet that includes fresh fruits and vegetables, whole grains, lean protein, and low-fat dairy products. Limit your intake of foods with high amounts of sugar, saturated fats, and salt. Take vitamin and mineral supplements as recommended by your health care provider. Do not drink alcohol if your health care provider tells you not to drink. If you drink alcohol: Limit how much you have to 0-2 drinks a day. Know how much alcohol is in your drink. In the [place] one drink equals one 12 oz bottle of beer (355 mL), one 5 oz glass of wine (148 mL), or one 1 oz glass of hard liquor (44 mL). Lifestyle Brush your teeth every morning and night with fluoride toothpaste. Floss one time each day. Exercise for at least 30 minutes 5 or more days each week. Do not use any products that contain nicotine or tobacco. These products include cigarettes, chewing tobacco, and vaping devices, such as e-cigarettes. If you need help quitting, ask your health care provider. Do not use drugs. If you are sexually active, practice safe sex. Use a condom or other form of protection to prevent STIs. Take aspirin  only as told by your health care provider. Make sure that you understand how much to take and what form to take. Work with your health care provider to find out whether it is safe and beneficial for you to take aspirin  daily. Ask your health care provider if you need to take a cholesterol-lowering medicine (statin). Find healthy ways to manage stress, such as: Meditation, yoga, or listening to music. Journaling. Talking to a trusted person. Spending time with friends and family. Safety Always wear your seat belt while driving or riding in a vehicle. Do not drive: If you have been drinking alcohol. Do not ride with someone who has been drinking. When you are tired or distracted. While  texting. If you have been using any mind-altering substances or drugs. Wear a helmet and other protective equipment during sports activities. If you have firearms in your house, make sure you follow all gun safety procedures. Minimize exposure to UV radiation to reduce your risk of skin cancer. What's next? Visit your health care provider once a year for an annual wellness visit. Ask your health care provider how often you should have your eyes and teeth checked. Stay up to date on all vaccines. This information is not intended to replace advice given to you by your health care provider. Make sure you discuss any questions you have with your health care provider. Document Revised: 02/16/2021 Document Reviewed: 02/16/2021 Elsevier Patient Education  2024 [employer].

## 2024-01-15 NOTE — Assessment & Plan Note (Signed)
Stable on finasteride 1 mg daily. 

## 2024-01-15 NOTE — Progress Notes (Signed)
 Chief Complaint:  Richard [name] is a 68 y.o. male who presents today for his annual comprehensive physical exam.    Assessment/Plan:  Chronic Problems Addressed Today: Hyperlipemia Check lipids.  On Crestor  40 mg daily and Repatha  per cardiology.  Essential hypertension Blood pressure at goal today on amlodipine  benazepril  10-40 once daily.  Recurrent major depressive disorder, in full remission (HCC) Improving compared to last visit though still not fully controlled.  He is tolerating Trintellix  well.  He would like to increase the dose.  Will go to 20 mg daily.  We did discuss referral to see a therapist however he declined.  He will follow-up with us  in a few weeks via MyChart we can adjust as needed.  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  Elevated PSA Check PSA.  Follows with urology.  Ulcerative colitis (HCC) Continue management per GI.  Insomnia Stable on trazodone  25 to 50 mg nightly.  Will refill today.  Obese He is doing a great job with diet and exercise.  Tolerating Wegovy  well which was started by cardiology.  He is down about 50 pounds since last year.  Congratulated patient on weight loss.  High risk sexual behavior   May be interested in starting Truvada for PrEP.  We discussed typical protocol for this including routine labs and continuous dosing versus as needed dosing.  He does not wish to start anything today though will follow-up with us  via MyChart if he does.  Preventative Healthcare: Check labs.  Up-to-date on vaccine.  Due for colonoscopy next year.  Patient Counseling(The following topics were reviewed and/or handout was given):  -Nutrition: Stressed importance of moderation in sodium/caffeine intake, saturated fat and cholesterol, caloric balance, sufficient intake of fresh fruits, vegetables, and fiber.  -Stressed the importance of regular exercise.   -Substance Abuse: Discussed cessation/primary prevention of tobacco,  alcohol, or other drug use; driving or other dangerous activities under the influence; availability of treatment for abuse.   -Injury prevention: Discussed safety belts, safety helmets, smoke detector, smoking near bedding or upholstery.   -Sexuality: Discussed sexually transmitted diseases, partner selection, use of condoms, avoidance of unintended pregnancy and contraceptive alternatives.   -Dental health: Discussed importance of regular tooth brushing, flossing, and dental visits.  -Health maintenance and immunizations reviewed. Please refer to Health maintenance section.  Return to care in 1 year for next preventative visit.     Subjective:  HPI:  He has no acute complaints today. See assessment / plan for status of chronic conditions. He is doing well today.  No acute concerns.  Lifestyle Diet: Balanced. Plenty of fruits and vegetables.  Exercise: Walking routinely. Weight training.      01/15/2024    8:30 AM  Depression screen PHQ 2/9  Decreased Interest 0  Down, Depressed, Hopeless 0  PHQ - 2 Score 0  Altered sleeping 0  Tired, decreased energy 0  Change in appetite 0  Feeling bad or failure about yourself  0  Trouble concentrating 0  Moving slowly or fidgety/restless 0  Suicidal thoughts 0  PHQ-9 Score 0  Difficult doing work/chores Not difficult at all    Health Maintenance Due  Topic Date Due   Medicare Annual Wellness (AWV)  Never done     ROS: Per HPI, otherwise a complete review of systems was negative.   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 BALDNESS 05/28/2007   Recurrent major depressive disorder, in full remission (HCC) 10/07/2015   with GAD    SLEEP APNEA 12/18/2008   wears CPAP   Sleep apnea    wears cpap nightly   Tubular adenoma of colon    Patient Active Problem List   Diagnosis Date Noted   High risk sexual behavior 01/15/2024   Elevated PSA 02/22/2023   Low testosterone  02/22/2023   ADHD 07/25/2022   Obese 03/21/2022   Insomnia 04/24/2019   Hx of adenomatous colonic polyps 10/21/2015   Recurrent major depressive disorder, in full remission (HCC) 10/07/2015   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   Sleep apnea 12/18/2008   Hyperlipemia 05/28/2007   Essential hypertension 05/28/2007   Ulcerative colitis (HCC) 05/28/2007   Past Surgical History:  Procedure Laterality Date   COLONOSCOPY  02/2017   hx polyps& IC   NASAL SINUS SURGERY     POLYPECTOMY     colon polyps   WISDOM TOOTH EXTRACTION      Family History  Problem Relation Age of Onset   Hyperlipidemia Father    Hypertension Father    Heart disease Father    Breast cancer Mother        <50   Ulcerative colitis Cousin        2 cousins paternal side   Colon polyps Cousin    Colon cancer Neg Hx    Esophageal cancer Neg Hx    Stomach cancer Neg Hx    Rectal cancer Neg Hx    Prostate cancer Neg Hx     Medications- reviewed and updated Current Outpatient Medications  Medication Sig Dispense Refill   amLODipine -benazepril  (LOTREL) 10-40 MG capsule TAKE 1 CAPSULE BY MOUTH EVERY DAY 90 capsule 1   amphetamine-dextroamphetamine (ADDERALL XR) 10 MG 24 hr capsule Take 10 mg by mouth every morning. Not taken regularly-makes him feel nervous and jittery.     aspirin  EC 81 MG tablet Take 1 tablet (81 mg total) by mouth daily. Swallow whole. 90 tablet 3   dorzolamide-timolol (COSOPT) 2-0.5 % ophthalmic solution Place 1 drop into both eyes 2 (two) times daily.     Evolocumab  (REPATHA  SURECLICK) 140  MG/ML SOAJ Inject 140 mg into the skin every 14 (fourteen) days. 6 mL 3   finasteride  (PROPECIA ) 1 MG tablet Take 1 tablet (1 mg total) by mouth daily. 90 tablet 3   Multiple Vitamins-Iron (MULTIVITAMIN/IRON) TABS Take 1 tablet by mouth daily.     NASONEX 50 MCG/ACT nasal spray PLACE 2 SQUIRTS IN EACH NOSTRIL DAILY 17 g 11   rosuvastatin  (CRESTOR ) 40 MG tablet Take 1 tablet (40 mg total) by mouth daily. 90 tablet 3   Semaglutide -Weight Management (WEGOVY ) 2.4 MG/0.75ML SOAJ Inject 2.4 mg into the skin once a week. 3 mL 11   sulfaSALAzine  (AZULFIDINE ) 500 MG tablet TAKE 3 TABLETS BY MOUTH 2 TIMES A DAY. 540 tablet 3   testosterone  cypionate (DEPOTESTOSTERONE CYPIONATE) 200 MG/ML injection 0.75 ml Intramuscular     vortioxetine  HBr (TRINTELLIX ) 20 MG TABS tablet Take 1 tablet (20 mg total) by mouth daily. 90 tablet 3   traZODone  (DESYREL ) 50 MG tablet Take 0.5-1 tablets (  25-50 mg total) by mouth at bedtime as needed for sleep. 90 tablet 2   No current facility-administered medications for this visit.    Allergies-reviewed and updated No Known Allergies  Social History   Socioeconomic History   Marital status: Single    Spouse name: Not on file   Number of children: 0   Years of education: Not on file   Highest education level: Bachelor's degree (e.g., BA, AB, BS)  Occupational History   Occupation: Engineer, drilling: [employer]  Tobacco Use   Smoking status: Former    Current packs/day: 0.00    Average packs/day: 0.5 packs/day for 25.0 years (12.5 ttl pk-yrs)    Types: Cigarettes    Start date: 09/05/1979    Quit date: 09/04/2004    Years since quitting: 19.3   Smokeless tobacco: Never  Vaping Use   Vaping status: Never Used  Substance and Sexual Activity   Alcohol use: Yes    Alcohol/week: 5.0 - 7.0 standard drinks of alcohol    Types: 5 - 7 Standard drinks or equivalent per week    Comment: 1 drink per day at most   Drug use: No   Sexual activity: Not on file   Other Topics Concern   Not on file  Social History Narrative   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      Home Situation: lives alone      Spiritual Beliefs: none      Lifestyle: walks some; diet is not great      Social Drivers of Corporate investment banker Strain: Low Risk  (06/20/2023)   Overall Financial Resource Strain (CARDIA)    Difficulty of Paying Living Expenses: Not very hard  Food Insecurity: No Food Insecurity (06/20/2023)   Hunger Vital Sign    Worried About Running Out of Food in the Last Year: Never true    Ran Out of Food in the Last Year: Never true  Transportation Needs: No Transportation Needs (06/20/2023)   PRAPARE - Administrator, Civil Service (Medical): No    Lack of Transportation (Non-Medical): No  Physical Activity: Insufficiently Active (06/20/2023)   Exercise Vital Sign    Days of Exercise per Week: 2 days    Minutes of Exercise per Session: 20 min  Stress: Stress Concern Present (06/20/2023)   [employer] of Occupational Health - Occupational Stress Questionnaire    Feeling of Stress : To some extent  Social Connections: Socially Isolated (06/20/2023)   Social Connection and Isolation Panel [NHANES]    Frequency of Communication with Friends and Family: More than three times a week    Frequency of Social Gatherings with Friends and Family: More than three times a week    Attends Religious Services: Never    Database administrator or Organizations: No    Attends Engineer, structural: Not on file    Marital Status: Never married        Objective:  Physical Exam: BP 132/79   Pulse 75   Temp 98.1 F (36.7 C) (Temporal)   Ht 6\' 1"  (1.854 m)   Wt 252 lb 9.6 oz (114.6 kg)   SpO2 98%   BMI 33.33 kg/m   Body mass index is 33.33 kg/m. Wt Readings from Last 3 Encounters:  01/15/24 252 lb 9.6 oz (114.6 kg)  08/16/23 276 lb 6.4 oz (125.4 kg)  07/04/23 284 lb 3.2 oz (128.9 kg)   Gen: NAD, resting  comfortably HEENT: TMs  normal bilaterally. OP clear. No thyromegaly noted.  CV: RRR with no murmurs appreciated Pulm: NWOB, CTAB with no crackles, wheezes, or rhonchi GI: Normal bowel sounds present. Soft, Nontender, Nondistended. MSK: no edema, cyanosis, or clubbing noted Skin: warm, dry Neuro: CN2-12 grossly intact. Strength 5/5 in upper and lower extremities. Reflexes symmetric and intact bilaterally.  Psych: Normal affect and thought content     Yoseline Andersson M. Daneil Dunker, MD 01/15/2024 8:55 AM

## 2024-01-15 NOTE — Assessment & Plan Note (Signed)
 Check PSA.  Follows with urology.

## 2024-01-15 NOTE — Assessment & Plan Note (Signed)
Continue management per GI.  

## 2024-01-15 NOTE — Assessment & Plan Note (Signed)
 Blood pressure at goal today on amlodipine  benazepril  10-40 once daily.

## 2024-01-15 NOTE — Assessment & Plan Note (Signed)
 He is doing a great job with diet and exercise.  Tolerating Wegovy  well which was started by cardiology.  He is down about 50 pounds since last year.  Congratulated patient on weight loss.

## 2024-01-15 NOTE — Assessment & Plan Note (Signed)
 Improving compared to last visit though still not fully controlled.  He is tolerating Trintellix  well.  He would like to increase the dose.  Will go to 20 mg daily.  We did discuss referral to see a therapist however he declined.  He will follow-up with us  in a few weeks via MyChart we can adjust as needed.

## 2024-01-15 NOTE — Assessment & Plan Note (Signed)
 Check lipids.  On Crestor  40 mg daily and Repatha  per cardiology.

## 2024-01-15 NOTE — Assessment & Plan Note (Signed)
 May be interested in starting Truvada for PrEP.  We discussed typical protocol for this including routine labs and continuous dosing versus as needed dosing.  He does not wish to start anything today though will follow-up with us  via MyChart if he does.

## 2024-01-16 ENCOUNTER — Ambulatory Visit: Payer: Self-pay | Admitting: Family Medicine

## 2024-01-16 NOTE — Progress Notes (Signed)
 Great news!  His labs are all at goal.  Do not need to make any changes to his treatment plan at this time.  He should continue to work on diet and exercise and we can recheck everything in a year.

## 2024-01-31 ENCOUNTER — Other Ambulatory Visit (HOSPITAL_COMMUNITY): Payer: Self-pay

## 2024-02-12 ENCOUNTER — Ambulatory Visit

## 2024-02-19 ENCOUNTER — Other Ambulatory Visit: Payer: Self-pay

## 2024-02-25 ENCOUNTER — Other Ambulatory Visit (HOSPITAL_COMMUNITY): Payer: Self-pay

## 2024-02-28 DIAGNOSIS — Z961 Presence of intraocular lens: Secondary | ICD-10-CM | POA: Diagnosis not present

## 2024-02-28 DIAGNOSIS — H401131 Primary open-angle glaucoma, bilateral, mild stage: Secondary | ICD-10-CM | POA: Diagnosis not present

## 2024-02-28 DIAGNOSIS — H2512 Age-related nuclear cataract, left eye: Secondary | ICD-10-CM | POA: Diagnosis not present

## 2024-03-01 ENCOUNTER — Other Ambulatory Visit: Payer: Self-pay | Admitting: Family Medicine

## 2024-03-24 ENCOUNTER — Other Ambulatory Visit (HOSPITAL_COMMUNITY): Payer: Self-pay

## 2024-03-31 ENCOUNTER — Ambulatory Visit (INDEPENDENT_AMBULATORY_CARE_PROVIDER_SITE_OTHER)

## 2024-03-31 VITALS — Ht 73.0 in | Wt 247.0 lb

## 2024-03-31 DIAGNOSIS — Z0000 Encounter for general adult medical examination without abnormal findings: Secondary | ICD-10-CM

## 2024-03-31 NOTE — Progress Notes (Signed)
 Subjective:   Richard [name] is a 68 y.o. who presents for a Medicare Wellness preventive visit.  As a reminder, Annual Wellness Visits don't include a physical exam, and some assessments may be limited, especially if this visit is performed virtually. We may recommend an in-person follow-up visit with your provider if needed.  Visit Complete: Virtual I connected with  [name] on 03/31/24 by a video and audio enabled telemedicine application and verified that I am speaking with the correct person using two identifiers.  Patient Location: Home  Provider Location: Office/Clinic  I discussed the limitations of evaluation and management by telemedicine. The patient expressed understanding and agreed to proceed.  Vital Signs: Because this visit was a virtual/telehealth visit, some criteria may be missing or patient reported. Any vitals not documented were not able to be obtained and vitals that have been documented are patient reported.    Persons Participating in Visit: Patient.  AWV Questionnaire: No: Patient Medicare AWV questionnaire was not completed prior to this visit.  Cardiac Risk Factors include: advanced age (>43men, >28 women);dyslipidemia;obesity (BMI >30kg/m2);hypertension;male gender     Objective:    Today's Vitals   03/31/24 1449  Weight: 247 lb (112 kg)  Height: 6' 1 (1.854 m)   Body mass index is 32.59 kg/m.     03/31/2024    2:53 PM 02/14/2017   11:14 AM 01/19/2015    7:59 AM 01/04/2015    7:49 AM  Advanced Directives  Does Patient Have a Medical Advance Directive? No No  No  No   Would patient like information on creating a medical advance directive? No - Patient declined        Data saved with a previous flowsheet row definition    Current Medications (verified) Outpatient Encounter Medications as of 03/31/2024  Medication Sig   amLODipine -benazepril  (LOTREL) 10-40 MG capsule TAKE 1 CAPSULE BY MOUTH EVERY DAY    amphetamine-dextroamphetamine (ADDERALL XR) 10 MG 24 hr capsule Take 10 mg by mouth every morning. Not taken regularly-makes him feel nervous and jittery.   aspirin  EC 81 MG tablet Take 1 tablet (81 mg total) by mouth daily. Swallow whole.   dorzolamide-timolol (COSOPT) 2-0.5 % ophthalmic solution Place 1 drop into both eyes 2 (two) times daily.   Evolocumab  (REPATHA  SURECLICK) 140 MG/ML SOAJ Inject 140 mg into the skin every 14 (fourteen) days.   finasteride  (PROPECIA ) 1 MG tablet Take 1 tablet (1 mg total) by mouth daily.   Multiple Vitamins-Iron (MULTIVITAMIN/IRON) TABS Take 1 tablet by mouth daily.   NASONEX 50 MCG/ACT nasal spray PLACE 2 SQUIRTS IN EACH NOSTRIL DAILY   rosuvastatin  (CRESTOR ) 40 MG tablet Take 1 tablet (40 mg total) by mouth daily.   Semaglutide -Weight Management (WEGOVY ) 2.4 MG/0.75ML SOAJ Inject 2.4 mg into the skin once a week.   sulfaSALAzine  (AZULFIDINE ) 500 MG tablet TAKE 3 TABLETS BY MOUTH 2 TIMES A DAY.   testosterone  cypionate (DEPOTESTOSTERONE CYPIONATE) 200 MG/ML injection 0.75 ml Intramuscular   traZODone  (DESYREL ) 50 MG tablet Take 0.5-1 tablets (25-50 mg total) by mouth at bedtime as needed for sleep.   vortioxetine  HBr (TRINTELLIX ) 20 MG TABS tablet Take 1 tablet (20 mg total) by mouth daily.   No facility-administered encounter medications on file as of 03/31/2024.    Allergies (verified) Patient has no known allergies.   History: Past Medical History:  Diagnosis Date   ADHD (attention deficit hyperactivity disorder) 07/25/2022   ALLERGIC RHINITIS CAUSE UNSPECIFIED 05/21/2009   Allergy  BACK PAIN, LUMBAR, WITH RADICULOPATHY 03/18/2010   COLITIS, ULCERATIVE 1997   Coronary artery calcification seen on CAT scan 01/09/2022   HYPERLIPIDEMIA 05/28/2007   HYPERTENSION 05/28/2007   HYPOGONADISM, MALE 07/24/2008   Insomnia 05/28/2007   Internal hemorrhoids    MALE PATTERN BALDNESS 05/28/2007   Recurrent major depressive disorder, in full remission  (HCC) 10/07/2015   with GAD    SLEEP APNEA 12/18/2008   wears CPAP   Sleep apnea    wears cpap nightly   Tubular adenoma of colon    Past Surgical History:  Procedure Laterality Date   COLONOSCOPY  02/2017   hx polyps& IC   NASAL SINUS SURGERY     POLYPECTOMY     colon polyps   WISDOM TOOTH EXTRACTION     Family History  Problem Relation Age of Onset   Hyperlipidemia Father    Hypertension Father    Heart disease Father    Breast cancer Mother        <50   Ulcerative colitis Cousin        2 cousins paternal side   Colon polyps Cousin    Colon cancer Neg Hx    Esophageal cancer Neg Hx    Stomach cancer Neg Hx    Rectal cancer Neg Hx    Prostate cancer Neg Hx    Social History   Socioeconomic History   Marital status: Single    Spouse name: Not on file   Number of children: 0   Years of education: Not on file   Highest education level: Bachelor's degree (e.g., BA, AB, BS)  Occupational History   Occupation: Engineer, drilling: [employer]  Tobacco Use   Smoking status: Former    Current packs/day: 0.00    Average packs/day: 0.5 packs/day for 25.0 years (12.5 ttl pk-yrs)    Types: Cigarettes    Start date: 09/05/1979    Quit date: 09/04/2004    Years since quitting: 19.5   Smokeless tobacco: Never  Vaping Use   Vaping status: Never Used  Substance and Sexual Activity   Alcohol use: Yes    Alcohol/week: 5.0 - 7.0 standard drinks of alcohol    Types: 5 - 7 Standard drinks or equivalent per week    Comment: 1 drink per day at most   Drug use: No   Sexual activity: Not on file  Other Topics Concern   Not on file  Social History Narrative   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      Home Situation: lives alone      Spiritual Beliefs: none      Lifestyle: walks some; diet is not great      Social Drivers of Corporate investment banker Strain: Low Risk  (03/31/2024)   Overall Financial Resource Strain (CARDIA)    Difficulty of Paying Living Expenses:  Not hard at all  Food Insecurity: No Food Insecurity (03/31/2024)   Hunger Vital Sign    Worried About Running Out of Food in the Last Year: Never true    Ran Out of Food in the Last Year: Never true  Transportation Needs: No Transportation Needs (03/31/2024)   PRAPARE - Administrator, Civil Service (Medical): No    Lack of Transportation (Non-Medical): No  Physical Activity: Insufficiently Active (03/31/2024)   Exercise Vital Sign    Days of Exercise per Week: 5 days    Minutes of Exercise per Session: 20 min  Stress:  No Stress Concern Present (03/31/2024)   [employer] of Occupational Health - Occupational Stress Questionnaire    Feeling of Stress: Only a little  Social Connections: Moderately Isolated (03/31/2024)   Social Connection and Isolation Panel    Frequency of Communication with Friends and Family: More than three times a week    Frequency of Social Gatherings with Friends and Family: More than three times a week    Attends Religious Services: Never    Database administrator or Organizations: Yes    Attends Engineer, structural: 1 to 4 times per year    Marital Status: Never married    Tobacco Counseling Counseling given: Not Answered    Clinical Intake:  Pre-visit preparation completed: Yes  Pain : No/denies pain     BMI - recorded: 32.59 Nutritional Status: BMI > 30  Obese Nutritional Risks: None Diabetes: No  Lab Results  Component Value Date   HGBA1C 4.8 01/15/2024   HGBA1C 5.4 01/11/2023   HGBA1C 5.3 11/21/2021     How often do you need to have someone help you when you read instructions, pamphlets, or other written materials from your doctor or pharmacy?: 1 - Never  Interpreter Needed?: No  Information entered by :: Ellouise Haws, LPN   Activities of Daily Living      03/31/2024    2:51 PM  In your present state of health, do you have any difficulty performing the following activities:  Hearing? 0  Vision? 0   Difficulty concentrating or making decisions? 0  Walking or climbing stairs? 0  Dressing or bathing? 0  Doing errands, shopping? 0  Preparing Food and eating ? N  Using the Toilet? N  In the past six months, have you accidently leaked urine? N  Do you have problems with loss of bowel control? N  Managing your Medications? N  Managing your Finances? N  Housekeeping or managing your Housekeeping? N    Patient Care Team: Kennyth Worth HERO, MD as PCP - General (Family Medicine) Thukkani, Arun K, MD as PCP - Cardiology (Cardiology) Porter Andrez SAUNDERS, PA-C (Inactive) as Physician Assistant (Dermatology) Aneita Gwendlyn DASEN, MD (Inactive) as Consulting Physician (Gastroenterology)   I have updated your Care Teams any recent Medical Services you may have received from other providers in the past year.     Assessment:   This is a routine wellness examination for [facility].  Hearing/Vision screen Hearing Screening - Comments:: Pt denies any hearing issues  Vision Screening - Comments:: Wears rx glasses - up to date with routine eye exams with Dr Arlyss King  [facility]      Goals Addressed             This Visit's Progress    Patient Stated       Weight loss 20 lbs        Depression Screen      03/31/2024    2:53 PM 01/15/2024    8:30 AM 01/15/2024    8:27 AM 08/16/2023    9:48 AM 02/22/2023   10:07 AM 01/11/2023    8:31 AM 07/25/2022   11:26 AM  PHQ 2/9 Scores  PHQ - 2 Score 1 0 0 6 0 0 0  PHQ- 9 Score  0  19 0 1 4    Fall Risk      03/31/2024    2:57 PM 01/15/2024    8:27 AM 08/16/2023    9:15 AM 02/22/2023   10:07 AM 01/11/2023  8:31 AM  Fall Risk   Falls in the past year? 0 0 0 0 0  Number falls in past yr: 0 0 0 0 0  Injury with Fall? 0 0 0 0 0  Risk for fall due to : No Fall Risks No Fall Risks No Fall Risks No Fall Risks No Fall Risks  Follow up Falls prevention discussed        MEDICARE RISK AT HOME:   Medicare Risk at Home Any stairs in or around the  home?: Yes If so, are there any without handrails?: No Home free of loose throw rugs in walkways, pet beds, electrical cords, etc?: Yes Adequate lighting in your home to reduce risk of falls?: Yes Life alert?: No Use of a cane, walker or w/c?: No Grab bars in the bathroom?: No Shower chair or bench in shower?: Yes Elevated toilet seat or a handicapped toilet?: No  TIMED UP AND GO:  Was the test performed?  No  Cognitive Function: 6CIT completed        03/31/2024    2:57 PM  6CIT Screen  What Year? 0 points  What month? 0 points  What time? 0 points  Count back from 20 0 points  Months in reverse 0 points  Repeat phrase 0 points  Total Score 0 points    Immunizations Immunization History  Administered Date(s) Administered   Fluad Trivalent(High Dose 65+) 06/21/2023   Influenza Split 05/30/2012   Influenza Whole 06/10/2009, 05/18/2010   Influenza, High Dose Seasonal PF 07/20/2016   Influenza,inj,Quad PF,6+ Mos 04/30/2013, 05/28/2014, 08/13/2015, 06/21/2017, 07/11/2018, 04/24/2019, 05/21/2020   Influenza-Unspecified 06/04/2021   PFIZER(Purple Top)SARS-COV-2 Vaccination 10/08/2019, 10/28/2019, 06/14/2020   PNEUMOCOCCAL CONJUGATE-20 01/11/2023   Pfizer Covid-19 Vaccine Bivalent Booster 78yrs & up 10/08/2021   Pfizer(Comirnaty)Fall Seasonal Vaccine 12 years and older 07/01/2023   Td 09/04/2005   Tdap 10/07/2015   Unspecified SARS-COV-2 Vaccination 06/29/2022   Zoster Recombinant(Shingrix) 02/01/2020, 04/02/2020    Screening Tests Health Maintenance  Topic Date Due   COVID-19 Vaccine (7 - Pfizer risk 2024-25 season) 12/30/2023   INFLUENZA VACCINE  04/04/2024   Medicare Annual Wellness (AWV)  03/31/2025   Colonoscopy  05/02/2025   DTaP/Tdap/Td (3 - Td or Tdap) 10/06/2025   Pneumococcal Vaccine: 50+ Years  Completed   Hepatitis C Screening  Completed   Zoster Vaccines- Shingrix  Completed   Hepatitis B Vaccines  Aged Out   HPV VACCINES  Aged Out   Meningococcal B  Vaccine  Aged Out    Health Maintenance  Health Maintenance Due  Topic Date Due   COVID-19 Vaccine (7 - Pfizer risk 2024-25 season) 12/30/2023   Health Maintenance Items Addressed: See Nurse Notes at the end of this note  Additional Screening:  Vision Screening: Recommended annual ophthalmology exams for early detection of glaucoma and other disorders of the eye. Would you like a referral to an eye doctor? No    Dental Screening: Recommended annual dental exams for proper oral hygiene  Community Resource Referral / Chronic Care Management: CRR required this visit?  No   CCM required this visit?  No   Plan:    I have personally reviewed and noted the following in the patient's chart:   Medical and social history Use of alcohol, tobacco or illicit drugs  Current medications and supplements including opioid prescriptions. Patient is not currently taking opioid prescriptions. Functional ability and status Nutritional status Physical activity Advanced directives List of other physicians Hospitalizations, surgeries, and ER visits in  previous 12 months Vitals Screenings to include cognitive, depression, and falls Referrals and appointments  In addition, I have reviewed and discussed with patient certain preventive protocols, quality metrics, and best practice recommendations. A written personalized care plan for preventive services as well as general preventive health recommendations were provided to patient.   Ellouise VEAR Haws, LPN   2/71/7974   After Visit Summary: (MyChart) Due to this being a telephonic visit, the after visit summary with patients personalized plan was offered to patient via MyChart   Notes: Nothing significant to report at this time.

## 2024-03-31 NOTE — Patient Instructions (Signed)
 Richard [name] , Thank you for taking time out of your busy schedule to complete your Annual Wellness Visit with me. I enjoyed our conversation and look forward to speaking with you again next year. I, as well as your care team,  appreciate your ongoing commitment to your health goals. Please review the following plan we discussed and let me know if I can assist you in the future. Your Game plan/ To Do List    Referrals: If you haven't heard from the office you've been referred to, please reach out to them at the phone provided.   Follow up Visits: Next Medicare AWV with our clinical staff: 04/02/25   Have you seen your provider in the last 6 months (3 months if uncontrolled diabetes)? Yes Next Office Visit with your provider: 01/16/25  Clinician Recommendations:  Aim for 30 minutes of exercise or brisk walking, 6-8 glasses of water, and 5 servings of fruits and vegetables each day.       This is a list of the screening recommended for you and due dates:  Health Maintenance  Topic Date Due   Medicare Annual Wellness Visit  Never done   COVID-19 Vaccine (7 - Pfizer risk 2024-25 season) 12/30/2023   Flu Shot  04/04/2024   Colon Cancer Screening  05/02/2025   DTaP/Tdap/Td vaccine (3 - Td or Tdap) 10/06/2025   Pneumococcal Vaccine for age over 56  Completed   Hepatitis C Screening  Completed   Zoster (Shingles) Vaccine  Completed   Hepatitis B Vaccine  Aged Out   HPV Vaccine  Aged Out   Meningitis B Vaccine  Aged Out    Advanced directives: (Declined) Advance directive discussed with you today. Even though you declined this today, please call our office should you change your mind, and we can give you the proper paperwork for you to fill out. Advance Care Planning is important because it:  [x]  Makes sure you receive the medical care that is consistent with your values, goals, and preferences  [x]  It provides guidance to your family and loved ones and reduces their decisional burden about  whether or not they are making the right decisions based on your wishes.  Follow the link provided in your after visit summary or read over the paperwork we have mailed to you to help you started getting your Advance Directives in place. If you need assistance in completing these, please reach out to us  so that we can help you!  See attachments for Preventive Care and Fall Prevention Tips.

## 2024-04-07 NOTE — Progress Notes (Signed)
 Cardiology Office Note:   Date:  04/14/2024  ID:  Richard [name], DOB [birth year], MRN [id number] PCP:  Kennyth Worth HERO, MD  [facility] Providers Cardiologist:  Wendel Haws, MD Referring MD: Kennyth Worth HERO, MD  Chief Complaint/Reason for Referral: Cardiology follow-up ASSESSMENT:    1. Coronary artery calcification seen on CAT scan   2. Aortic atherosclerosis (HCC)   3. Aneurysm of ascending aorta without rupture (HCC)   4. Essential hypertension   5. Elevated lipoprotein(a)   6. BMI 39.0-39.9,adult   7. Obstructive sleep apnea      PLAN:   In order of problems listed above: Coronary artery calcification: Continue aspirin  81 mg, Crestor  40 mg, Repatha  140 mg every 2 weeks, and blood pressure control. Aortic atherosclerosis: Continue aspirin  81 mg, Crestor  40 mg, Repatha  140 mg every 2 weeks Aortic aneurysm: CTA in May of this year showed similar size.  Will obtain an MRA next year to monitor. Hypertension: Continue amlodipine /benazepril  10 x 40 mg daily; BP above goal today.  Patient will check his blood pressures over the next week and transmit to us  via epic.  We will adjust blood pressure regimen accordingly. Elevated LP(a): LDL in May of this year was 68; continue Crestor  40 mg, Repatha  24 mg every 2 weeks Elevated BMI: Continue Wegovy  2.4 mg every week Obstructive sleep apnea: Continue CPAP.      Dispo:  Return in about 1 year (around 04/14/2025).     Medication Adjustments/Labs and Tests Ordered: Current medicines are reviewed at length with the patient today.  Concerns regarding medicines are outlined above.  The following changes have been made:  no change   Labs/tests ordered: Orders Placed This Encounter  Procedures   MR Angiogram Chest W Wo Contrast    Medication Changes: No orders of the defined types were placed in this encounter.   Current medicines are reviewed at length with the patient today.  The patient does not have concerns regarding  medicines.  I spent 32 minutes reviewing all clinical data during and prior to this visit including all relevant imaging studies, laboratories, clinical information from other health systems, and prior notes from both Cardiology and other specialties, interviewing the patient, and conducting a complete physical examination in order to formulate a comprehensive and personalized evaluation and treatment plan.  History of Present Illness:      FOCUSED PROBLEM LIST:   Elevated coronary artery calcification Calcium  score CT 2023 Intolerant of Toprol  Aortic atherosclerosis Calcium  score CT 2023 Ascending aortic aneurysm 4.4 cm CT 2023  4.0 cm CT 2024 4.2 cm CT May 2025 Hypertension Hyperlipidemia LP(a) 284 Ulcerative colitis Obstructive sleep apnea on CPAP BMI 39 Right bundle branch block EKG 2024  May 2024:  The patient is a 68 y.o. male with the indicated medical history here for recommendations regarding elevated calcium  score.  He was seen by his primary care provider in the last few months and was doing well.  He was referred for calcium  score for screening purposes and this demonstrated elevated coronary calcium  levels.   The patient works full-time as a very Fish farm manager.  He is under a lot of stress.  He denies any exertional angina or dyspnea.  He used to work out at [employer] prior to him contracting COVID back in the fall.  At that time he was working out on a treadmill and exercising with weights.  He does not smoke.  He does not complain of any presyncope or syncope.  He  denies any palpitations or paroxysmal nocturnal dyspnea.  He has required no emergency room visits or hospitalizations.  He is otherwise well without significant complaints.  Plan: Start aspirin  81 mg, Toprol  12.5 mg at bedtime, increase Crestor  to 40 mg and check lipid panel, LFTs, LP(a) in 2 months; CT scan in 1 year for aortic aneurysm.  Refer to pharmacy for recommendations regarding weight loss  therapy.  October 2024: In the interim he was seen by the pharmacy division and started on Wegovy  and Repatha .  He has lost about 15 to 20 pounds.  He feels better.  He is less short of breath.  He denies any chest pain.  He had issues with the Toprol  that was started last year which resulted in fatigue.  He also had issues with atorvastatin.  He said no issues with rosuvastatin .  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  He has no plans to slow down at this point.  He is otherwise feeling well and is without complaints today.  Plan: Obtain CTA 1 year.  August 2025:  Patient consents to use of AI scribe. In the interim the patient had a CT scan with diameter of the ascending thoracic aorta of around 4.2 cm which per the radiology read was relatively unchanged.  Patient has been doing well.  He feels much improved and has lost about 55 pounds since starting Wegovy .  He denies any shortness of breath, chest pain, palpitations, Abran says nocturnal dyspnea, orthopnea.  He continues to use CPAP without any issues.  He is otherwise well without complaints other than occasional constipation which he attributes to his Wegovy .  He tolerates this without any issues.            Current Medications: Current Meds  Medication Sig   amLODipine -benazepril  (LOTREL) 10-40 MG capsule TAKE 1 CAPSULE BY MOUTH EVERY DAY   amphetamine-dextroamphetamine (ADDERALL XR) 10 MG 24 hr capsule Take 10 mg by mouth every morning. Not taken regularly-makes him feel nervous and jittery.   aspirin  EC 81 MG tablet Take 1 tablet (81 mg total) by mouth daily. Swallow whole.   dorzolamide-timolol (COSOPT) 2-0.5 % ophthalmic solution Place 1 drop into both eyes 2 (two) times daily.   Evolocumab  (REPATHA  SURECLICK) 140 MG/ML SOAJ Inject 140 mg into the skin every 14 (fourteen) days.   finasteride  (PROPECIA ) 1 MG tablet Take 1 tablet (1 mg total) by mouth daily.   Multiple Vitamins-Iron (MULTIVITAMIN/IRON)  TABS Take 1 tablet by mouth daily.   NASONEX 50 MCG/ACT nasal spray PLACE 2 SQUIRTS IN EACH NOSTRIL DAILY   rosuvastatin  (CRESTOR ) 40 MG tablet Take 1 tablet (40 mg total) by mouth daily.   Semaglutide -Weight Management (WEGOVY ) 2.4 MG/0.75ML SOAJ Inject 2.4 mg into the skin once a week.   sulfaSALAzine  (AZULFIDINE ) 500 MG tablet TAKE 3 TABLETS BY MOUTH 2 TIMES A DAY.   testosterone  cypionate (DEPOTESTOSTERONE CYPIONATE) 200 MG/ML injection 0.75 ml Intramuscular   traZODone  (DESYREL ) 50 MG tablet Take 0.5-1 tablets (25-50 mg total) by mouth at bedtime as needed for sleep.   vortioxetine  HBr (TRINTELLIX ) 20 MG TABS tablet Take 1 tablet (20 mg total) by mouth daily.     Review of Systems:   Please see the history of present illness.    All other systems reviewed and are negative.     EKGs/Labs/Other Test Reviewed:   EKG: EKG May 2023 that I personally reviewed demonstrates normal sinus rhythm  EKG Interpretation Date/Time:  Ventricular Rate:    PR Interval:    QRS Duration:    QT Interval:    QTC Calculation:   R Axis:      Text Interpretation:           Risk Assessment/Calculations:          Physical Exam:   VS:  BP (!) 160/70   Pulse 81   Ht 6' 1 (1.854 m)   Wt 256 lb 3.2 oz (116.2 kg)   SpO2 94%   BMI 33.80 kg/m    HYPERTENSION CONTROL Vitals:   04/14/24 0826 04/14/24 0842  BP: (!) 160/76 (!) 160/70    The patient's blood pressure is elevated above target today.  In order to address the patient's elevated BP: Blood pressure will be monitored at home to determine if medication changes need to be made.      Wt Readings from Last 3 Encounters:  04/14/24 256 lb 3.2 oz (116.2 kg)  03/31/24 247 lb (112 kg)  01/15/24 252 lb 9.6 oz (114.6 kg)      GENERAL:  No apparent distress, AOx3 HEENT:  No carotid bruits, +2 carotid impulses, no scleral icterus CAR: RRR no murmurs, gallops, rubs, or thrills RES:  Clear to auscultation bilaterally ABD:  Soft,  nontender, nondistended, positive bowel sounds x 4 VASC:  +2 radial pulses, +2 carotid pulses NEURO:  CN 2-12 grossly intact; motor and sensory grossly intact PSYCH:  No active depression or anxiety EXT:  No edema, ecchymosis, or cyanosis  Signed, Khaiden Segreto K Sanayah Munro, MD  04/14/2024 8:59 AM    [facility] [address] [place], [place], [place]  [address] Phone: [phone]; Fax: [phone]   Note:  This document was prepared using Dragon voice recognition software and may include unintentional dictation errors.

## 2024-04-11 ENCOUNTER — Other Ambulatory Visit (HOSPITAL_COMMUNITY): Payer: Self-pay

## 2024-04-14 ENCOUNTER — Ambulatory Visit: Attending: Internal Medicine | Admitting: Internal Medicine

## 2024-04-14 ENCOUNTER — Encounter: Payer: Self-pay | Admitting: Internal Medicine

## 2024-04-14 VITALS — BP 160/70 | HR 81 | Ht 73.0 in | Wt 256.2 lb

## 2024-04-14 DIAGNOSIS — E7841 Elevated Lipoprotein(a): Secondary | ICD-10-CM

## 2024-04-14 DIAGNOSIS — Z6839 Body mass index (BMI) 39.0-39.9, adult: Secondary | ICD-10-CM | POA: Diagnosis not present

## 2024-04-14 DIAGNOSIS — I7121 Aneurysm of the ascending aorta, without rupture: Secondary | ICD-10-CM

## 2024-04-14 DIAGNOSIS — I2510 Atherosclerotic heart disease of native coronary artery without angina pectoris: Secondary | ICD-10-CM | POA: Diagnosis not present

## 2024-04-14 DIAGNOSIS — G4733 Obstructive sleep apnea (adult) (pediatric): Secondary | ICD-10-CM | POA: Diagnosis not present

## 2024-04-14 DIAGNOSIS — I10 Essential (primary) hypertension: Secondary | ICD-10-CM

## 2024-04-14 DIAGNOSIS — I700 Atherosclerosis of aorta: Secondary | ICD-10-CM | POA: Diagnosis not present

## 2024-04-14 NOTE — Patient Instructions (Signed)
 Monitor blood pressure at home keep a record and send to Dr. Wendel via MyChart in 2-3 weeks.  Medication Instructions:  No changes *If you need a refill on your cardiac medications before your next appointment, please call your pharmacy*  Lab Work: none If you have labs (blood work) drawn today and your tests are completely normal, you will receive your results only by: MyChart Message (if you have MyChart) OR A paper copy in the mail If you have any lab test that is abnormal or we need to change your treatment, we will call you to review the results.  Testing/Procedures: MR Angiogram Chest - Due in May 2026   Follow-Up: At [facility], you and your health needs are our priority.  As part of our continuing mission to provide you with exceptional heart care, our providers are all part of one team.  This team includes your primary Cardiologist (physician) and Advanced Practice Providers or APPs (Physician Assistants and Nurse Practitioners) who all work together to provide you with the care you need, when you need it.  Your next appointment:   12 month(s)  Provider:   Arun K Thukkani, MD

## 2024-04-21 ENCOUNTER — Other Ambulatory Visit (HOSPITAL_COMMUNITY): Payer: Self-pay

## 2024-05-13 ENCOUNTER — Encounter: Payer: Self-pay | Admitting: Family Medicine

## 2024-05-13 NOTE — Telephone Encounter (Signed)
**Note De-identified  [place] Obfuscation** Please advise 

## 2024-05-14 NOTE — Telephone Encounter (Signed)
 He should be able to call there to schedule an appointment though it is ok for us  to place a referral if needed.  Worth HERO. Kennyth, MD 05/14/2024 7:20 AM

## 2024-05-19 ENCOUNTER — Encounter: Payer: Self-pay | Admitting: Physician Assistant

## 2024-05-23 ENCOUNTER — Other Ambulatory Visit: Payer: Self-pay

## 2024-05-23 ENCOUNTER — Other Ambulatory Visit (HOSPITAL_COMMUNITY): Payer: Self-pay

## 2024-05-27 ENCOUNTER — Encounter: Payer: Self-pay | Admitting: Urology

## 2024-05-27 ENCOUNTER — Ambulatory Visit: Payer: Medicare HMO | Admitting: Urology

## 2024-05-27 ENCOUNTER — Encounter: Payer: Self-pay | Admitting: Family Medicine

## 2024-05-27 VITALS — BP 166/93 | HR 88 | Ht 73.0 in | Wt 255.0 lb

## 2024-05-27 DIAGNOSIS — E291 Testicular hypofunction: Secondary | ICD-10-CM | POA: Diagnosis not present

## 2024-05-27 DIAGNOSIS — Z87898 Personal history of other specified conditions: Secondary | ICD-10-CM | POA: Diagnosis not present

## 2024-05-27 NOTE — Telephone Encounter (Signed)
**Note De-identified  [place] Obfuscation** Please advise 

## 2024-05-27 NOTE — Progress Notes (Signed)
 Assessment: 1. Hypogonadism in male   2. History of elevated PSA     Plan: I personally reviewed the patient's chart including provider notes, lab results. Labs today:  testosterone , CBC, PSA Continue bioidentical testosterone  20% daily.  [facility]. Return to office in 1 year.  Chief Complaint: Chief Complaint  Patient presents with   Hypogonadism    HPI: Richard [name] is a 68 y.o. male who presents for continued evaluation of hypogonadism and borderline PSA. He previously followed by Dr. Shona and was last seen in September 2024. He has been on testosterone  replacement for several years.  This was initially done through a men's health clinic and he was found to have very high testosterone  levels as well as a rise in his PSA.  He was subsequently switched to a transdermal bioidentical cream 20%.  He has done well with this.  His PSA also normalized. Lab studies 05/2023: PSA 2.0 Testosterone  373  PSA 5/25: 2.3  He returns today for follow-up.  He continues on bioidentical testosterone  20% daily.  His symptoms are stable.  No new lower urinary tract symptoms.  He does have occasional decreased stream and nocturia x 2.  No dysuria or gross hematuria. IPSS = 13/2.  Portions of the above documentation were copied from a prior visit for review purposes only.  Allergies: No Known Allergies  PMH: Past Medical History:  Diagnosis Date   ADHD (attention deficit hyperactivity disorder) 07/25/2022   ALLERGIC RHINITIS CAUSE UNSPECIFIED 05/21/2009   Allergy    BACK PAIN, LUMBAR, WITH RADICULOPATHY 03/18/2010   COLITIS, ULCERATIVE 1997   Coronary artery calcification seen on CAT scan 01/09/2022   HYPERLIPIDEMIA 05/28/2007   HYPERTENSION 05/28/2007   HYPOGONADISM, MALE 07/24/2008   Insomnia 05/28/2007   Internal hemorrhoids    MALE PATTERN BALDNESS 05/28/2007   Recurrent major depressive disorder, in full remission 10/07/2015   with GAD    SLEEP APNEA  12/18/2008   wears CPAP   Sleep apnea    wears cpap nightly   Tubular adenoma of colon     PSH: Past Surgical History:  Procedure Laterality Date   COLONOSCOPY  02/2017   hx polyps& IC   NASAL SINUS SURGERY     POLYPECTOMY     colon polyps   WISDOM TOOTH EXTRACTION      SH: Social History   Tobacco Use   Smoking status: Former    Current packs/day: 0.00    Average packs/day: 0.5 packs/day for 25.0 years (12.5 ttl pk-yrs)    Types: Cigarettes    Start date: 09/05/1979    Quit date: 09/04/2004    Years since quitting: 19.7   Smokeless tobacco: Never  Vaping Use   Vaping status: Never Used  Substance Use Topics   Alcohol use: Yes    Alcohol/week: 5.0 - 7.0 standard drinks of alcohol    Types: 5 - 7 Standard drinks or equivalent per week    Comment: 1 drink per day at most   Drug use: No    ROS: Constitutional:  Negative for fever, chills, weight loss CV: Negative for chest pain, previous MI, hypertension Respiratory:  Negative for shortness of breath, wheezing, sleep apnea, frequent cough GI:  Negative for nausea, vomiting, bloody stool, GERD  PE: BP (!) 166/93   Pulse 88   Ht 6' 1 (1.854 m)   Wt 255 lb (115.7 kg)   BMI 33.64 kg/m  GENERAL APPEARANCE:  Well appearing, well developed, well nourished,  NAD HEENT:  Atraumatic, normocephalic, oropharynx clear NECK:  Supple without lymphadenopathy or thyromegaly ABDOMEN:  Soft, non-tender, no masses EXTREMITIES:  Moves all extremities well, without clubbing, cyanosis, or edema NEUROLOGIC:  Alert and oriented x 3, normal gait, CN II-XII grossly intact MENTAL STATUS:  appropriate BACK:  Non-tender to palpation, No CVAT SKIN:  Warm, dry, and intact GU: Prostate: 40 g, NT, no nodules Rectum: Normal tone,  no masses or tenderness   Results: None

## 2024-05-27 NOTE — Telephone Encounter (Signed)
 He is on the maximum dose of his blood pressure meds.  Recommend he follow-up with us  or his cardiologist soon to discuss other medications.

## 2024-05-28 ENCOUNTER — Ambulatory Visit: Payer: Self-pay | Admitting: Urology

## 2024-05-28 LAB — CBC
Hematocrit: 45 % (ref 37.5–51.0)
Hemoglobin: 15 g/dL (ref 13.0–17.7)
MCH: 31.6 pg (ref 26.6–33.0)
MCHC: 33.3 g/dL (ref 31.5–35.7)
MCV: 95 fL (ref 79–97)
Platelets: 230 x10E3/uL (ref 150–450)
RBC: 4.74 x10E6/uL (ref 4.14–5.80)
RDW: 11.7 % (ref 11.6–15.4)
WBC: 7.5 x10E3/uL (ref 3.4–10.8)

## 2024-05-28 LAB — PSA: Prostate Specific Ag, Serum: 2.3 ng/mL (ref 0.0–4.0)

## 2024-05-28 LAB — TESTOSTERONE: Testosterone: 402 ng/dL (ref 264–916)

## 2024-05-29 DIAGNOSIS — M5416 Radiculopathy, lumbar region: Secondary | ICD-10-CM | POA: Diagnosis not present

## 2024-05-30 ENCOUNTER — Ambulatory Visit (INDEPENDENT_AMBULATORY_CARE_PROVIDER_SITE_OTHER): Admitting: Family Medicine

## 2024-05-30 VITALS — BP 136/78 | HR 77 | Temp 98.2°F | Ht 73.0 in | Wt 256.8 lb

## 2024-05-30 DIAGNOSIS — I10 Essential (primary) hypertension: Secondary | ICD-10-CM

## 2024-05-30 DIAGNOSIS — Z23 Encounter for immunization: Secondary | ICD-10-CM

## 2024-05-30 NOTE — Assessment & Plan Note (Signed)
 Had a lengthy discussion today regarding his recent blood pressure elevations.  Blood pressure is at goal in the office today.  He is currently on amlodipine  benazepril  10-40 once daily.  Tolerating this well.  Given his well-controlled today do not think we need to start any additional hypertensives at this point.  He will monitor at home and follow-up with us  in a few weeks via MyChart.  We discussed proper technique for monitoring blood pressure at home.  Also discussed lifestyle modifications including regular exercise and low-sodium diet.  If blood pressure is not controlled with above would consider starting ACE or ARB.

## 2024-05-30 NOTE — Patient Instructions (Signed)
 It was very nice to see you today!  VISIT SUMMARY: Today, we discussed your concerns about elevated blood pressure and reviewed your recent readings. We also talked about your general health maintenance, including vaccinations.  YOUR PLAN: ELEVATED BLOOD PRESSURE: Your blood pressure readings have been intermittently high, which may be due to white coat hypertension. Your current reading is 136/78 mmHg, so no medication is needed at this time. -Monitor your blood pressure at home for a few weeks and send us  the readings to assess the average. -Ensure you are using the proper technique: sit for 5-10 minutes before measuring, keep the arm cuff at heart level, keep your feet flat, and avoid stress. -Implement lifestyle changes such as regular exercise and reducing salt intake.  GENERAL HEALTH MAINTENANCE: We discussed your vaccinations. -You received a flu vaccination today. -We discussed COVID-19 vaccination options. You can obtain the COVID-19 vaccination at the [facility]; no prescription is needed.  Return if symptoms worsen or fail to improve.   Take care, Dr Kennyth  PLEASE NOTE:  If you had any lab tests, please let us  know if you have not heard back within a few days. You may see your results on mychart before we have a chance to review them but we will give you a call once they are reviewed by us .   If we ordered any referrals today, please let us  know if you have not heard from their office within the next week.   If you had any urgent prescriptions sent in today, please check with the pharmacy within an hour of our visit to make sure the prescription was transmitted appropriately.   Please try these tips to maintain a healthy lifestyle:  Eat at least 3 REAL meals and 1-2 snacks per day.  Aim for no more than 5 hours between eating.  If you eat breakfast, please do so within one hour of getting up.   Each meal should contain half fruits/vegetables, one quarter protein,  and one quarter carbs (no bigger than a computer mouse)  Cut down on sweet beverages. This includes juice, soda, and sweet tea.   Drink at least 1 glass of water with each meal and aim for at least 8 glasses per day  Exercise at least 150 minutes every week.

## 2024-05-30 NOTE — Telephone Encounter (Signed)
Patient has an OV with PCP today.

## 2024-05-30 NOTE — Progress Notes (Signed)
   Richard [name] is a 68 y.o. male who presents today for an office visit.  Assessment/Plan:  Chronic Problems Addressed Today: Essential hypertension Had a lengthy discussion today regarding his recent blood pressure elevations.  Blood pressure is at goal in the office today.  He is currently on amlodipine  benazepril  10-40 once daily.  Tolerating this well.  Given his well-controlled today do not think we need to start any additional hypertensives at this point.  He will monitor at home and follow-up with us  in a few weeks via MyChart.  We discussed proper technique for monitoring blood pressure at home.  Also discussed lifestyle modifications including regular exercise and low-sodium diet.  If blood pressure is not controlled with above would consider starting ACE or ARB.  Flu shot given today.  Will go to the pharmacy for COVID-vaccine.    Subjective:  HPI:  See assessment / plan for status of chronic conditions.     Discussed the use of AI scribe software for clinical note transcription with the patient, who gave verbal consent to proceed.  History of Present Illness Richard [name] is a 68 year old male who presents with concerns about elevated blood pressure.  He checked his blood pressure this morning and found it to be elevated. About a month ago, during a visit to a cardiologist, his blood pressure was also high. He was unable to monitor it at home initially due to a broken blood pressure monitor. During a subsequent visit to a urologist, his blood pressure was recorded as high, approximately 163 over a day. He has since purchased a new blood pressure monitor and has been checking his blood pressure regularly. While it was initially high today, it decreased upon subsequent checks, including when he arrived at the clinic. He has a history of 'lab coat hypertension' but has not experienced it in recent years. No chest pressure, headache, or vision changes.  He also  experiences back pain and recently consulted an orthopedic specialist. He received a steroid shot and has been scheduled for physical therapy.         Objective:  Physical Exam: BP 136/78   Pulse 77   Temp 98.2 F (36.8 C) (Temporal)   Ht 6' 1 (1.854 m)   Wt 256 lb 12.8 oz (116.5 kg)   SpO2 97%   BMI 33.88 kg/m   Gen: No acute distress, resting comfortably CV: Regular rate and rhythm with no murmurs appreciated Pulm: Normal work of breathing, clear to auscultation bilaterally with no crackles, wheezes, or rhonchi Neuro: Grossly normal, moves all extremities Psych: Normal affect and thought content      Richard Worth M. Kennyth, MD 05/30/2024 3:07 PM

## 2024-06-04 DIAGNOSIS — M47814 Spondylosis without myelopathy or radiculopathy, thoracic region: Secondary | ICD-10-CM | POA: Diagnosis not present

## 2024-06-04 DIAGNOSIS — M9902 Segmental and somatic dysfunction of thoracic region: Secondary | ICD-10-CM | POA: Diagnosis not present

## 2024-06-04 DIAGNOSIS — M9903 Segmental and somatic dysfunction of lumbar region: Secondary | ICD-10-CM | POA: Diagnosis not present

## 2024-06-04 DIAGNOSIS — M47812 Spondylosis without myelopathy or radiculopathy, cervical region: Secondary | ICD-10-CM | POA: Diagnosis not present

## 2024-06-04 DIAGNOSIS — M9901 Segmental and somatic dysfunction of cervical region: Secondary | ICD-10-CM | POA: Diagnosis not present

## 2024-06-04 DIAGNOSIS — M51362 Other intervertebral disc degeneration, lumbar region with discogenic back pain and lower extremity pain: Secondary | ICD-10-CM | POA: Diagnosis not present

## 2024-06-11 NOTE — Progress Notes (Signed)
 Brighten Orndoff                                          MRN: [id number]   06/11/2024   The [facility] Quality Team Specialist reviewed this patient medical record for the purposes of chart review for care gap closure. The following were reviewed: abstraction for care gap closure-controlling blood pressure.    [facility] Quality Team

## 2024-06-12 DIAGNOSIS — M47812 Spondylosis without myelopathy or radiculopathy, cervical region: Secondary | ICD-10-CM | POA: Diagnosis not present

## 2024-06-12 DIAGNOSIS — M9903 Segmental and somatic dysfunction of lumbar region: Secondary | ICD-10-CM | POA: Diagnosis not present

## 2024-06-12 DIAGNOSIS — M9901 Segmental and somatic dysfunction of cervical region: Secondary | ICD-10-CM | POA: Diagnosis not present

## 2024-06-12 DIAGNOSIS — M51362 Other intervertebral disc degeneration, lumbar region with discogenic back pain and lower extremity pain: Secondary | ICD-10-CM | POA: Diagnosis not present

## 2024-06-12 DIAGNOSIS — M9902 Segmental and somatic dysfunction of thoracic region: Secondary | ICD-10-CM | POA: Diagnosis not present

## 2024-06-12 DIAGNOSIS — M47814 Spondylosis without myelopathy or radiculopathy, thoracic region: Secondary | ICD-10-CM | POA: Diagnosis not present

## 2024-06-13 ENCOUNTER — Other Ambulatory Visit: Payer: Self-pay | Admitting: Internal Medicine

## 2024-06-13 DIAGNOSIS — I700 Atherosclerosis of aorta: Secondary | ICD-10-CM

## 2024-06-13 DIAGNOSIS — E785 Hyperlipidemia, unspecified: Secondary | ICD-10-CM

## 2024-06-13 DIAGNOSIS — I2510 Atherosclerotic heart disease of native coronary artery without angina pectoris: Secondary | ICD-10-CM

## 2024-06-19 DIAGNOSIS — M47814 Spondylosis without myelopathy or radiculopathy, thoracic region: Secondary | ICD-10-CM | POA: Diagnosis not present

## 2024-06-19 DIAGNOSIS — M9903 Segmental and somatic dysfunction of lumbar region: Secondary | ICD-10-CM | POA: Diagnosis not present

## 2024-06-19 DIAGNOSIS — M9901 Segmental and somatic dysfunction of cervical region: Secondary | ICD-10-CM | POA: Diagnosis not present

## 2024-06-19 DIAGNOSIS — M9902 Segmental and somatic dysfunction of thoracic region: Secondary | ICD-10-CM | POA: Diagnosis not present

## 2024-06-19 DIAGNOSIS — M51362 Other intervertebral disc degeneration, lumbar region with discogenic back pain and lower extremity pain: Secondary | ICD-10-CM | POA: Diagnosis not present

## 2024-06-19 DIAGNOSIS — M47812 Spondylosis without myelopathy or radiculopathy, cervical region: Secondary | ICD-10-CM | POA: Diagnosis not present

## 2024-06-24 ENCOUNTER — Other Ambulatory Visit: Payer: Self-pay

## 2024-06-24 ENCOUNTER — Ambulatory Visit: Admitting: Physician Assistant

## 2024-06-24 ENCOUNTER — Encounter: Payer: Self-pay | Admitting: Physician Assistant

## 2024-06-24 ENCOUNTER — Other Ambulatory Visit (HOSPITAL_COMMUNITY): Payer: Self-pay

## 2024-06-24 VITALS — BP 138/80 | HR 65 | Ht 73.0 in | Wt 259.0 lb

## 2024-06-24 DIAGNOSIS — K5100 Ulcerative (chronic) pancolitis without complications: Secondary | ICD-10-CM | POA: Diagnosis not present

## 2024-06-24 DIAGNOSIS — Z860101 Personal history of adenomatous and serrated colon polyps: Secondary | ICD-10-CM | POA: Diagnosis not present

## 2024-06-24 MED ORDER — SULFASALAZINE 500 MG PO TABS
1500.0000 mg | ORAL_TABLET | Freq: Two times a day (BID) | ORAL | 3 refills | Status: AC
  Filled 2024-06-24: qty 540, 90d supply, fill #0

## 2024-06-24 NOTE — Patient Instructions (Signed)
 Your provider has requested that you go to the basement level for lab work before leaving today. Press B on the elevator. The lab is located at the first door on the left as you exit the elevator.  Please follow up sooner if symptoms increase or worsen  Due to recent changes in healthcare laws, you may see the results of your imaging and laboratory studies on MyChart before your provider has had a chance to review them.  We understand that in some cases there may be results that are confusing or concerning to you. Not all laboratory results come back in the same time frame and the provider may be waiting for multiple results in order to interpret others.  Please give us  48 hours in order for your provider to thoroughly review all the results before contacting the office for clarification of your results.   Thank you for trusting me with your gastrointestinal care!   Ellouise Console, PA-C _______________________________________________________  If your blood pressure at your visit was 140/90 or greater, please contact your primary care physician to follow up on this.  _______________________________________________________  If you are age 33 or older, your body mass index should be between 23-30. Your Body mass index is 34.17 kg/m. If this is out of the aforementioned range listed, please consider follow up with your Primary Care Provider.  If you are age 52 or younger, your body mass index should be between 19-25. Your Body mass index is 34.17 kg/m. If this is out of the aformentioned range listed, please consider follow up with your Primary Care Provider.   ________________________________________________________  The [facility] providers would like to encourage you to use MYCHART to communicate with providers for non-urgent requests or questions.  Due to long hold times on the telephone, sending your provider a message by [facility] may be a faster and more efficient way to get a response.   Please allow 48 business hours for a response.  Please remember that this is for non-urgent requests.  _______________________________________________________

## 2024-06-24 NOTE — Progress Notes (Signed)
 Agree with assessment and plan as outlined.

## 2024-06-24 NOTE — Progress Notes (Signed)
 Ellouise Console, PA-C [address] [place], [place]  [address] Phone: [phone]   Primary Care Physician: Kennyth Worth HERO, MD  Primary Gastroenterologist:  Ellouise Console, PA-C / Elspeth Naval, MD   Chief Complaint: History of colon polyps, discussed repeat colonoscopy       HPI:   Discussed the use of AI scribe software for clinical note transcription with the patient, who gave verbal consent to proceed. History of Present Illness Richard [name] is a 68 year old male with ulcerative colitis who presents for a follow-up visit.    He has a long-standing history of ulcerative colitis and underwent a colonoscopy in August 2023, which showed no cancerous or precancerous changes. Biopsies revealed mild active colitis. He has been on azulfidine  since high school, approximately fifty years, and finds it effective with no side effects. An attempt to switch medications 15-20 years ago led to problems, prompting a return to azulfidine .  He experiences one bowel movement per day, typically in the morning, with slight variations due to diet and stress. Occasionally, he notices a little blood in his stool, which he attributes to internal hemorrhoids. No abdominal pain or unusual weight loss is reported, aside from weight loss associated with Wegovy , which he started about a year ago, resulting in a fifty-pound intentional loss in a year.  He takes fiber supplements and stool softeners to aid bowel movements and occasionally uses Miralax. For back pain, he consumes about six extra strength Tylenol tablets a day, which he reports does not cause bleeding when taken with water or a meal. He avoids NSAIDs due to their potential to cause colitis flare-ups.  He denies any family history of colon cancer but mentions a couple of cousins on his father's side with colitis. He experiences some joint aches and pains, which he attributes to age, but denies any skin rashes or history of  uveitis.  He has regular eye exam annually.  Recent blood work, including a CBC and CMP, was normal, with a white count of 7.5 and hemoglobin of 15.0. He is due for a fecal calprotectin stool test to assess inflammation levels.   04/2022 last Colonoscopy by Dr. Aneita: No polyps.  Normal visible mucosa.  Biopsies showed mild chronic colitis.  No granulomas or dysplasia. - 3 year repeat (due 04/2025).   Current Outpatient Medications  Medication Sig Dispense Refill   amLODipine -benazepril  (LOTREL) 10-40 MG capsule TAKE 1 CAPSULE BY MOUTH EVERY DAY 90 capsule 1   amphetamine-dextroamphetamine (ADDERALL XR) 10 MG 24 hr capsule Take 10 mg by mouth every morning. Not taken regularly-makes him feel nervous and jittery.     aspirin  EC 81 MG tablet Take 1 tablet (81 mg total) by mouth daily. Swallow whole. 90 tablet 3   dorzolamide-timolol (COSOPT) 2-0.5 % ophthalmic solution Place 1 drop into both eyes 2 (two) times daily.     Evolocumab  (REPATHA  SURECLICK) 140 MG/ML SOAJ INJECT 140 MG INTO THE SKIN EVERY 14 (FOURTEEN) DAYS. 6 mL 1   finasteride  (PROPECIA ) 1 MG tablet Take 1 tablet (1 mg total) by mouth daily. 90 tablet 3   Multiple Vitamins-Iron (MULTIVITAMIN/IRON) TABS Take 1 tablet by mouth daily.     NASONEX 50 MCG/ACT nasal spray PLACE 2 SQUIRTS IN EACH NOSTRIL DAILY 17 g 11   rosuvastatin  (CRESTOR ) 40 MG tablet Take 1 tablet (40 mg total) by mouth daily. 90 tablet 3   semaglutide -weight management (WEGOVY ) 2.4 MG/0.75ML SOAJ SQ injection Inject 2.4 mg  into the skin once a week. 3 mL 11   traZODone  (DESYREL ) 50 MG tablet Take 0.5-1 tablets (25-50 mg total) by mouth at bedtime as needed for sleep. 90 tablet 2   vortioxetine  HBr (TRINTELLIX ) 20 MG TABS tablet Take 1 tablet (20 mg total) by mouth daily. 90 tablet 3   sulfaSALAzine  (AZULFIDINE ) 500 MG tablet Take 3 tablets (1,500 mg total) by mouth 2 (two) times daily. 540 tablet 3   No current facility-administered medications for this visit.     Allergies as of 06/24/2024   (No Known Allergies)    Past Medical History:  Diagnosis Date   ADHD (attention deficit hyperactivity disorder) 07/25/2022   ALLERGIC RHINITIS CAUSE UNSPECIFIED 05/21/2009   Allergy    BACK PAIN, LUMBAR, WITH RADICULOPATHY 03/18/2010   COLITIS, ULCERATIVE 1997   Coronary artery calcification seen on CAT scan 01/09/2022   HYPERLIPIDEMIA 05/28/2007   HYPERTENSION 05/28/2007   HYPOGONADISM, MALE 07/24/2008   Insomnia 05/28/2007   Internal hemorrhoids    MALE PATTERN BALDNESS 05/28/2007   Recurrent major depressive disorder, in full remission 10/07/2015   with GAD    SLEEP APNEA 12/18/2008   wears CPAP   Sleep apnea    wears cpap nightly   Tubular adenoma of colon     Past Surgical History:  Procedure Laterality Date   COLONOSCOPY  02/2017   hx polyps& IC   NASAL SINUS SURGERY     POLYPECTOMY     colon polyps   WISDOM TOOTH EXTRACTION      Review of Systems:    All systems reviewed and negative except where noted in HPI.    Physical Exam:  BP 138/80   Pulse 65   Ht 6' 1 (1.854 m)   Wt 259 lb (117.5 kg)   SpO2 96%   BMI 34.17 kg/m  No LMP for male patient.  General: Well-nourished, well-developed in no acute distress.  Lungs: Clear to auscultation bilaterally. Non-labored. Heart: Regular rate and rhythm, no murmurs rubs or gallops.  Abdomen: Bowel sounds are normal; Abdomen is Soft; No hepatosplenomegaly, masses or hernias;  No Abdominal Tenderness; No guarding or rebound tenderness. Neuro: Alert and oriented x 3.  Grossly intact.  Psych: Alert and cooperative, normal mood and affect.   Imaging Studies: No results found.  Labs: CBC    Component Value Date/Time   WBC 7.5 05/27/2024 1059   WBC 6.0 01/15/2024 0855   RBC 4.74 05/27/2024 1059   RBC 4.75 01/15/2024 0855   HGB 15.0 05/27/2024 1059   HCT 45.0 05/27/2024 1059   PLT 230 05/27/2024 1059   MCV 95 05/27/2024 1059   MCH 31.6 05/27/2024 1059   MCHC 33.3  05/27/2024 1059   MCHC 33.7 01/15/2024 0855   RDW 11.7 05/27/2024 1059   LYMPHSABS 1.6 06/21/2023 1102   MONOABS 0.7 06/21/2023 1102   EOSABS 0.2 06/21/2023 1102   BASOSABS 0.0 06/21/2023 1102    CMP     Component Value Date/Time   NA 138 01/15/2024 0855   K 4.2 01/15/2024 0855   CL 102 01/15/2024 0855   CO2 27 01/15/2024 0855   GLUCOSE 104 (H) 01/15/2024 0855   GLUCOSE 102 (H) 08/14/2006 0919   BUN 14 01/15/2024 0855   CREATININE 0.72 01/15/2024 0855   CALCIUM  9.3 01/15/2024 0855   PROT 6.7 01/15/2024 0855   PROT 6.9 05/31/2023 0835   ALBUMIN 4.5 01/15/2024 0855   ALBUMIN 4.6 05/31/2023 0835   AST 18 01/15/2024 0855   ALT  21 01/15/2024 0855   ALKPHOS 53 01/15/2024 0855   BILITOT 0.5 01/15/2024 0855   BILITOT 0.3 05/31/2023 0835   GFRNONAA >90 04/12/2012 1820   GFRAA >90 04/12/2012 1820       Assessment and Plan:   Jaqua Ching is a 68 y.o. y/o male presents for annual follow-up of ulcerative colitis, recently diagnosed in high school.  History of UC for 50 years.  In clinical remission on Azulfidine .  Previous colonoscopy 04/2022 showed no dysplasia, mild active colitis on biopsy. No recent abdominal pain, weight loss, or significant blood in stool. Normal CBC and CMP.  1.  Ulcerative colitis, in clinical remission on Azulfidine  for many years - Order fecal calprotectin stool test. - Schedule follow-up colonoscopy next year. - Refill azulfidine  prescription 500 mg 3 tablets twice daily. - Advise to avoid NSAIDs.   Ellouise Console, PA-C  Follow up office visit July 2026.  Plan for 3-year repeat colonoscopy August 2026.

## 2024-06-25 DIAGNOSIS — M9901 Segmental and somatic dysfunction of cervical region: Secondary | ICD-10-CM | POA: Diagnosis not present

## 2024-06-25 DIAGNOSIS — M9902 Segmental and somatic dysfunction of thoracic region: Secondary | ICD-10-CM | POA: Diagnosis not present

## 2024-06-25 DIAGNOSIS — M47814 Spondylosis without myelopathy or radiculopathy, thoracic region: Secondary | ICD-10-CM | POA: Diagnosis not present

## 2024-06-25 DIAGNOSIS — M9903 Segmental and somatic dysfunction of lumbar region: Secondary | ICD-10-CM | POA: Diagnosis not present

## 2024-06-25 DIAGNOSIS — M51362 Other intervertebral disc degeneration, lumbar region with discogenic back pain and lower extremity pain: Secondary | ICD-10-CM | POA: Diagnosis not present

## 2024-06-25 DIAGNOSIS — M47812 Spondylosis without myelopathy or radiculopathy, cervical region: Secondary | ICD-10-CM | POA: Diagnosis not present

## 2024-06-26 ENCOUNTER — Other Ambulatory Visit: Payer: Self-pay

## 2024-06-26 DIAGNOSIS — M545 Low back pain, unspecified: Secondary | ICD-10-CM | POA: Diagnosis not present

## 2024-07-02 DIAGNOSIS — M51362 Other intervertebral disc degeneration, lumbar region with discogenic back pain and lower extremity pain: Secondary | ICD-10-CM | POA: Diagnosis not present

## 2024-07-02 DIAGNOSIS — M9902 Segmental and somatic dysfunction of thoracic region: Secondary | ICD-10-CM | POA: Diagnosis not present

## 2024-07-02 DIAGNOSIS — M9903 Segmental and somatic dysfunction of lumbar region: Secondary | ICD-10-CM | POA: Diagnosis not present

## 2024-07-02 DIAGNOSIS — M47814 Spondylosis without myelopathy or radiculopathy, thoracic region: Secondary | ICD-10-CM | POA: Diagnosis not present

## 2024-07-02 DIAGNOSIS — M9901 Segmental and somatic dysfunction of cervical region: Secondary | ICD-10-CM | POA: Diagnosis not present

## 2024-07-02 DIAGNOSIS — M47812 Spondylosis without myelopathy or radiculopathy, cervical region: Secondary | ICD-10-CM | POA: Diagnosis not present

## 2024-07-03 DIAGNOSIS — G8918 Other acute postprocedural pain: Secondary | ICD-10-CM | POA: Diagnosis not present

## 2024-07-03 DIAGNOSIS — M47816 Spondylosis without myelopathy or radiculopathy, lumbar region: Secondary | ICD-10-CM | POA: Diagnosis not present

## 2024-07-03 DIAGNOSIS — M4807 Spinal stenosis, lumbosacral region: Secondary | ICD-10-CM | POA: Diagnosis not present

## 2024-07-03 DIAGNOSIS — M545 Low back pain, unspecified: Secondary | ICD-10-CM | POA: Diagnosis not present

## 2024-07-08 DIAGNOSIS — M51362 Other intervertebral disc degeneration, lumbar region with discogenic back pain and lower extremity pain: Secondary | ICD-10-CM | POA: Diagnosis not present

## 2024-07-08 DIAGNOSIS — M6240 Contracture of muscle, unspecified site: Secondary | ICD-10-CM | POA: Diagnosis not present

## 2024-07-08 DIAGNOSIS — M9902 Segmental and somatic dysfunction of thoracic region: Secondary | ICD-10-CM | POA: Diagnosis not present

## 2024-07-08 DIAGNOSIS — M47816 Spondylosis without myelopathy or radiculopathy, lumbar region: Secondary | ICD-10-CM | POA: Diagnosis not present

## 2024-07-08 DIAGNOSIS — M9903 Segmental and somatic dysfunction of lumbar region: Secondary | ICD-10-CM | POA: Diagnosis not present

## 2024-07-08 DIAGNOSIS — M9901 Segmental and somatic dysfunction of cervical region: Secondary | ICD-10-CM | POA: Diagnosis not present

## 2024-07-11 DIAGNOSIS — M5416 Radiculopathy, lumbar region: Secondary | ICD-10-CM | POA: Diagnosis not present

## 2024-07-22 DIAGNOSIS — M47816 Spondylosis without myelopathy or radiculopathy, lumbar region: Secondary | ICD-10-CM | POA: Diagnosis not present

## 2024-07-22 DIAGNOSIS — M51362 Other intervertebral disc degeneration, lumbar region with discogenic back pain and lower extremity pain: Secondary | ICD-10-CM | POA: Diagnosis not present

## 2024-07-22 DIAGNOSIS — M9903 Segmental and somatic dysfunction of lumbar region: Secondary | ICD-10-CM | POA: Diagnosis not present

## 2024-07-22 DIAGNOSIS — M9902 Segmental and somatic dysfunction of thoracic region: Secondary | ICD-10-CM | POA: Diagnosis not present

## 2024-07-22 DIAGNOSIS — M9901 Segmental and somatic dysfunction of cervical region: Secondary | ICD-10-CM | POA: Diagnosis not present

## 2024-07-22 DIAGNOSIS — M6240 Contracture of muscle, unspecified site: Secondary | ICD-10-CM | POA: Diagnosis not present

## 2024-07-23 ENCOUNTER — Encounter: Payer: Self-pay | Admitting: *Deleted

## 2024-07-23 DIAGNOSIS — Z006 Encounter for examination for normal comparison and control in clinical research program: Secondary | ICD-10-CM

## 2024-07-23 NOTE — Research (Signed)
 Message left for Richard [name] about re-event research. Encouraged him to call if he would like more information.

## 2024-07-23 NOTE — Research (Signed)
 Spoke with Richard [name] about pre-event states he would like more information. Emailed the 2 consents for him to read over. Encouraged him to call with any questions.

## 2024-07-24 ENCOUNTER — Other Ambulatory Visit: Payer: Self-pay

## 2024-07-24 ENCOUNTER — Encounter: Payer: Self-pay | Admitting: *Deleted

## 2024-07-24 ENCOUNTER — Other Ambulatory Visit (HOSPITAL_COMMUNITY): Payer: Self-pay

## 2024-07-24 ENCOUNTER — Other Ambulatory Visit: Payer: Self-pay | Admitting: Internal Medicine

## 2024-07-24 DIAGNOSIS — Z006 Encounter for examination for normal comparison and control in clinical research program: Secondary | ICD-10-CM

## 2024-07-24 NOTE — Research (Signed)
 Email received from Mr Hellwig scheduled an appointment for Dec 8 at 1100

## 2024-07-28 ENCOUNTER — Other Ambulatory Visit (HOSPITAL_COMMUNITY): Payer: Self-pay

## 2024-07-28 MED ORDER — ROSUVASTATIN CALCIUM 40 MG PO TABS
40.0000 mg | ORAL_TABLET | Freq: Every day | ORAL | 3 refills | Status: AC
  Filled 2024-07-28: qty 90, 90d supply, fill #0

## 2024-08-05 DIAGNOSIS — M5416 Radiculopathy, lumbar region: Secondary | ICD-10-CM | POA: Diagnosis not present

## 2024-08-11 ENCOUNTER — Encounter: Admitting: *Deleted

## 2024-08-11 DIAGNOSIS — Z006 Encounter for examination for normal comparison and control in clinical research program: Secondary | ICD-10-CM

## 2024-08-11 NOTE — Research (Signed)
 Subject Name: Richard [name] [facility]  Subject met inclusion and exclusion criteria.  The informed consent form, study requirements and expectations were reviewed with the subject and questions and concerns were addressed prior to the signing of the consent form.  The subject verbalized understanding of the trial requirements.  The subject agreed to participate in the pre-event LPa screening  trial and signed the informed consent at 1103  on 08-11-2024.  The informed consent was obtained prior to performance of any protocol-specific procedures for the subject.  A copy of the signed informed consent was given to the subject and a copy was placed in the subject's medical record.   Richard [name], Richard [name]   Pre-event  INCLUSION  Yes No  Participant has provided written informed consent before initiation of any study-specific activities/procedures. [x]  []   Age >= 50 years at the time of signing of the Lp(a) screening ICF. [x]  []   Lp(a) >= 200 nmol/L during Lp(a) screening by a central laboratory using an investigational IVD test. At least 4 weeks of stable and optimized lipid-lowering therapy consistent with regional/local clinical practice guidelines or according to investigator's judgment before Lp(a) screening.                                                                                                         _______________ Waiting on results   Participants meeting at least one of the following categories (A or B):  A. Multiple risk factors for atherosclerotic disease  1. Presence of >= 4 of any of the following ASCVD risk factors: a. Age >= 65 years men or women b. History of hypertension requiring pharmacotherapy c. Current cigarette tobacco smoking d. Diabetes mellitus (Type 1 or Type 2) requiring pharmacotherapy e. Screening high-sensitivity C-reactive protein (hs-CRP) >= 2.0 mg/L by central laboratory f. Screening estimated glomerular filtration rate (eGFR) 30 to < 60 mL/min/1.73  m2 by central laboratory g. Familial hypercholesterolemia  or family history of premature ASCVD (1st degree relative before age 16 years for males, and/or 1st degree relative before age 54 years for females)  AND/OR  B. History of atherosclerosis evidenced by:  1. Coronary Artery Disease-Reporting and Data System (CAD-RADS) P3, and/or 2. Coronary artery calcium  (CAC) > 300, and/or                          CAC  358 3. Atherosclerosis defined by at least one of the following AND >= 2 additional risk factors listed in 104 A: a. Coronary atherosclerosis as evidenced by a stenosis >= 50% in at least one artery b. Coronary atherosclerosis as evidenced by a stenosis >= 25% (or reported as at least mild) in at least 2 arteries detected on invasive or noninvasive imaging c. Coronary atherosclerosis as evidenced by a CAC score > 100 and/or CAD-RADS >= P2 d. Carotid atherosclerosis as evidenced by an internal carotid stenosis >= 50% e. Peripheral atherosclerosis as evidenced by >= 50% stenosis of limb artery and/or ankle brachial index < 0.9  Bold the above patient meets.  [x]  []   EXCLUSION Yes No  Prior acute atherothrombotic qualifying event at any time, defined as prior myocardial infarction, prior stroke, prior transient ischemic attack, or prior acute limb ischemia. []  [x]   Prior arterial revascularization at any time suspected to be associated with atherosclerosis. []  [x]   If available, participants without known atherosclerosis, CAC score = 0, CAD-RADS = 0, in the last 10 years prior to enrollment. []  [x]   Severe renal dysfunction, defined as an eGFR < 30 mL/min/1.73 m2 by central laboratory during screening []  [x]   History of decompensated liver cirrhosis and/or screening aspartate aminotransferase (AST) or alanine aminotransferase (ALT) > 3 x upper limit of normal (ULN), or total bilirubin (TBL) > 2 x ULN (except  in stable Gilbert's syndrome). []  [x]   History of major bleeding disorder (eg, hemophilia, von Willebrand disease, clotting factor deficiencies, etc). []  [x]   Planned arterial revascularization (percutaneous or surgical). []  [x]   Fasting triglycerides > 400 mg/dL (5.47 mmol/L) during screening. []  [x]   Participant has known sensitivity to any of the products or components to be administered during dosing or the study procedures. []  [x]   Malignancy not in remission for at least 5 years prior to enrollment, exceptions for less than 5 years since remission include non-melanoma skin cancers, localized thyroid  cancer (papillary, follicular, and medullary), cervical in situ carcinoma, breast ductal carcinoma in situ, or stage 1 prostate carcinoma. []  [x]   Diagnosis of severe heart failure (New York  Heart Association Functional Classification IV), and/or if available, most recent left ventricular ejection fraction < 30%. []  [x]   Uncontrolled or recurrent ventricular tachycardia in the past 3 months before enrollment. []  [x]   Atrial fibrillation or flutter and not on an anticoagulant despite an indication to be anticoagulated.    Uncontrolled hypertension at screening, defined as systolic blood pressure > 180 mmHg or diastolic blood pressure > 110 mmHg at rest despite antihypertensive therapy. []  [x]   Diabetes mellitus (Type 1 or Type 2) with a hemoglobin A1C (HbA1c) >= 10% by central laboratory at screening. []  [x]   History or evidence of any other clinically significant disorder, condition, or disease (such as active infection) that, in the opinion of the investigator or [employer] physician, if consulted, would pose a risk to participant safety, or interfere with the study evaluation, procedures or completion. []  [x]   Current or planned lipoprotein apheresis or < 3 months since last apheresis treatment before enrollment. []  [x]   Participant has taken a cholesteryl ester transfer protein inhibitor or  lomitapide in the last 12 months before enrollment. []  [x]   Previously received any therapy specifically targeting Lp(a) including but not limited to, olpasiran, pelacarsen, lepodisiran, zerlasiran, or muvalaplin []  [x]    Currently receiving treatment in another investigational device or drug study, or less than 30 days since ending treatment on another investigational device or drug study(ies). Other investigational procedures while participating in this study are excluded. []  [x]   Participants of childbearing potential unwilling to use protocol-specified method of contraception during treatment and for an additional 30 days after the last dose of investigational product.  []  [x]   Participants who are breastfeeding or who plan to breastfeed while on study through 30 days after the last dose of investigational product []  [x]   Participants planning to become pregnant while on study through 30 days after the last dose of investigational product.  []  [x]   Participants of childbearing potential with a positive pregnancy test assessed at screening and/or day 1 by a highly sensitive urine or serum  pregnancy test. []  [x]   Participant likely to not be available to complete all protocol-required study visits or procedures, and/or to comply with all required study procedures (eg, Clinical Outcome Assessments) to the best of the Participant and investigator's knowledge.                         []  [x]     Pre-Event Lp(a) Screening Visit  Date of Visit:   11-Aug-2024                 Subject Number:[id number]  During this visit the following activities were completed:  [x] Reading, Signing and Understanding the informed Consent for Lp(a) screening  [x] Demographics  [x] Medical History  [x] Adverse events  [x] Serious adverse device effects  [x] Concomitant therapies reviewed  [x] Central labs      [x] Lp(a)  Mr Richard [name] is here for Lpa Screening for pre-event. He reports feeling good. Informed  him I would call him once the lab results are back. Voices understanding. Blood drawn at 1108.    Current Outpatient Medications:    amLODipine -benazepril  (LOTREL) 10-40 MG capsule, TAKE 1 CAPSULE BY MOUTH EVERY DAY, Disp: 90 capsule, Rfl: 1   amphetamine-dextroamphetamine (ADDERALL XR) 10 MG 24 hr capsule, Take 10 mg by mouth every morning. Not taken regularly-makes him feel nervous and jittery., Disp: , Rfl:    aspirin  EC 81 MG tablet, Take 1 tablet (81 mg total) by mouth daily. Swallow whole., Disp: 90 tablet, Rfl: 3   dorzolamide-timolol (COSOPT) 2-0.5 % ophthalmic solution, Place 1 drop into both eyes 2 (two) times daily., Disp: , Rfl:    Evolocumab  (REPATHA  SURECLICK) 140 MG/ML SOAJ, INJECT 140 MG INTO THE SKIN EVERY 14 (FOURTEEN) DAYS., Disp: 6 mL, Rfl: 1   finasteride  (PROPECIA ) 1 MG tablet, Take 1 tablet (1 mg total) by mouth daily., Disp: 90 tablet, Rfl: 3   Multiple Vitamins-Iron (MULTIVITAMIN/IRON) TABS, Take 1 tablet by mouth daily., Disp: , Rfl:    NASONEX 50 MCG/ACT nasal spray, PLACE 2 SQUIRTS IN EACH NOSTRIL DAILY, Disp: 17 g, Rfl: 11   rosuvastatin  (CRESTOR ) 40 MG tablet, Take 1 tablet (40 mg total) by mouth daily., Disp: 90 tablet, Rfl: 3   semaglutide -weight management (WEGOVY ) 2.4 MG/0.75ML SOAJ SQ injection, Inject 2.4 mg into the skin once a week., Disp: 3 mL, Rfl: 11   sulfaSALAzine  (AZULFIDINE ) 500 MG tablet, Take 3 tablets (1,500 mg total) by mouth 2 (two) times daily., Disp: 540 tablet, Rfl: 3   traZODone  (DESYREL ) 50 MG tablet, Take 0.5-1 tablets (25-50 mg total) by mouth at bedtime as needed for sleep., Disp: 90 tablet, Rfl: 2   vortioxetine  HBr (TRINTELLIX ) 20 MG TABS tablet, Take 1 tablet (20 mg total) by mouth daily., Disp: 90 tablet, Rfl: 3

## 2024-08-18 ENCOUNTER — Encounter: Payer: Self-pay | Admitting: *Deleted

## 2024-08-18 DIAGNOSIS — Z006 Encounter for examination for normal comparison and control in clinical research program: Secondary | ICD-10-CM

## 2024-08-18 NOTE — Research (Signed)
 Spoke with Richard [name] about his LPa results ([phone]) He is scheduled to come in for the main screening Jan 6 @0900 .

## 2024-08-20 ENCOUNTER — Other Ambulatory Visit (HOSPITAL_COMMUNITY): Payer: Self-pay

## 2024-08-21 DIAGNOSIS — H401131 Primary open-angle glaucoma, bilateral, mild stage: Secondary | ICD-10-CM | POA: Diagnosis not present

## 2024-08-31 ENCOUNTER — Other Ambulatory Visit: Payer: Self-pay | Admitting: Family Medicine

## 2024-09-05 ENCOUNTER — Other Ambulatory Visit (HOSPITAL_COMMUNITY): Payer: Self-pay

## 2024-09-05 ENCOUNTER — Other Ambulatory Visit: Payer: Self-pay

## 2024-09-05 ENCOUNTER — Encounter: Payer: Self-pay | Admitting: Family Medicine

## 2024-09-05 DIAGNOSIS — I10 Essential (primary) hypertension: Secondary | ICD-10-CM

## 2024-09-05 MED ORDER — AMLODIPINE BESY-BENAZEPRIL HCL 10-40 MG PO CAPS
1.0000 | ORAL_CAPSULE | Freq: Every day | ORAL | 1 refills | Status: AC
  Filled 2024-09-05 – 2024-09-12 (×4): qty 90, 90d supply, fill #0

## 2024-09-08 ENCOUNTER — Other Ambulatory Visit (HOSPITAL_COMMUNITY): Payer: Self-pay

## 2024-09-09 ENCOUNTER — Encounter: Admitting: *Deleted

## 2024-09-09 VITALS — BP 143/79 | HR 83 | Temp 97.9°F | Resp 18 | Ht 73.0 in | Wt 255.0 lb

## 2024-09-09 DIAGNOSIS — Z006 Encounter for examination for normal comparison and control in clinical research program: Secondary | ICD-10-CM

## 2024-09-09 NOTE — Research (Addendum)
 Debby Burr                Subject Name: Alicia Ackert Gangwer  Subject met inclusion and exclusion criteria.  The informed consent form, study requirements and expectations were reviewed with the subject and questions and concerns were addressed prior to the signing of the consent form.  The subject verbalized understanding of the trial requirements.  The subject agreed to participate in the pre-event   trial and signed the informed consent at 0910 on 09-Sep-2024.  The informed consent was obtained prior to performance of any protocol-specific procedures for the subject.  A copy of the signed informed consent was given to the subject and a copy was placed in the subject's medical record.   Risha Barretta Ward    Pre-Event Main Screening  Date of Visit: 09-Sep-2024                     Subject Number: [id number]  During this visit the following activities were completed:  [x]  Reading, Signing and Understanding the informed Consent   [x]  Medical History                                         [x]  Smoking History (Prior, or concurrent use) Former  [x]  Vital signs     Arm: left                               [x]  Adverse events      BP:143/79      HR: 83                                                        [x]  Serious adverse device effects      Resp: 18                                                                                    Temp: 36.6                                                  [x]  Concomitant therapies reviewed      O2 Sat: 96%      Height: 6 ft 1 in                                                      Weight:255 lbs      Waist circumference: 47in   [x]  Central Labs        []  Urine Preg test        []   Serum Preg test        []  FSH (only if needed to determine childbearing potential)         [x]  Fasting glucose         [x]  HbA1c        [x]  Fasting lipid panel & apolipoproteins        [x]  Coag        [x]  Chemistry        [x]  Hematology        [x]   Hs-CRP   Mr Anstead was placed in a quite room given time to ask questions and read the consent. He reports no visits to the ed or urgent care since seen last. No changes in his meds. Vs taken at 0915. Blood drawn at 0925. Informed him  I will call him after the lab results come back to possibly schedule next visit.   Current Medications[1]      [1]  Current Outpatient Medications:    amLODipine -benazepril  (LOTREL ) 10-40 MG capsule, Take 1 capsule by mouth daily., Disp: 90 capsule, Rfl: 1   amphetamine-dextroamphetamine (ADDERALL XR) 10 MG 24 hr capsule, Take 10 mg by mouth every morning. Not taken regularly-makes him feel nervous and jittery., Disp: , Rfl:    aspirin  EC 81 MG tablet, Take 1 tablet (81 mg total) by mouth daily. Swallow whole., Disp: 90 tablet, Rfl: 3   dorzolamide-timolol (COSOPT) 2-0.5 % ophthalmic solution, Place 1 drop into both eyes 2 (two) times daily., Disp: , Rfl:    Evolocumab  (REPATHA  SURECLICK) 140 MG/ML SOAJ, INJECT 140 MG INTO THE SKIN EVERY 14 (FOURTEEN) DAYS., Disp: 6 mL, Rfl: 1   finasteride  (PROPECIA ) 1 MG tablet, Take 1 tablet (1 mg total) by mouth daily., Disp: 90 tablet, Rfl: 3   Multiple Vitamins-Iron (MULTIVITAMIN/IRON) TABS, Take 1 tablet by mouth daily., Disp: , Rfl:    NASONEX 50 MCG/ACT nasal spray, PLACE 2 SQUIRTS IN EACH NOSTRIL DAILY, Disp: 17 g, Rfl: 11   rosuvastatin  (CRESTOR ) 40 MG tablet, Take 1 tablet (40 mg total) by mouth daily., Disp: 90 tablet, Rfl: 3   semaglutide -weight management (WEGOVY ) 2.4 MG/0.75ML SOAJ SQ injection, Inject 2.4 mg into the skin once a week., Disp: 3 mL, Rfl: 11   sulfaSALAzine  (AZULFIDINE ) 500 MG tablet, Take 3 tablets (1,500 mg total) by mouth 2 (two) times daily., Disp: 540 tablet, Rfl: 3   traZODone  (DESYREL ) 50 MG tablet, Take 0.5-1 tablets (25-50 mg total) by mouth at bedtime as needed for sleep., Disp: 90 tablet, Rfl: 2   vortioxetine  HBr (TRINTELLIX ) 20 MG TABS tablet, Take 1 tablet (20 mg total) by mouth  daily., Disp: 90 tablet, Rfl: 3

## 2024-09-10 ENCOUNTER — Other Ambulatory Visit (HOSPITAL_COMMUNITY): Payer: Self-pay

## 2024-09-10 ENCOUNTER — Other Ambulatory Visit: Payer: Self-pay

## 2024-09-12 ENCOUNTER — Other Ambulatory Visit: Payer: Self-pay

## 2024-09-12 ENCOUNTER — Other Ambulatory Visit (HOSPITAL_COMMUNITY): Payer: Self-pay

## 2024-09-18 ENCOUNTER — Other Ambulatory Visit: Payer: Self-pay

## 2024-09-18 ENCOUNTER — Other Ambulatory Visit: Payer: Self-pay | Admitting: Family Medicine

## 2024-09-18 MED ORDER — FINASTERIDE 1 MG PO TABS
1.0000 mg | ORAL_TABLET | Freq: Every day | ORAL | 3 refills | Status: AC
  Filled 2024-09-18: qty 90, 90d supply, fill #0

## 2024-10-09 ENCOUNTER — Encounter: Admitting: *Deleted

## 2024-10-09 ENCOUNTER — Encounter: Payer: Self-pay | Admitting: *Deleted

## 2024-10-09 VITALS — BP 145/74 | HR 79 | Temp 98.2°F | Resp 18 | Wt 253.0 lb

## 2024-10-09 DIAGNOSIS — Z006 Encounter for examination for normal comparison and control in clinical research program: Secondary | ICD-10-CM

## 2024-10-09 MED ORDER — STUDY - OCEAN(A)-PRE-EVENT - OLPASIRAN 142 MG/ML OR PLACEBO SQ INJECTION (PI-HILTY)
142.0000 mg | PREFILLED_SYRINGE | SUBCUTANEOUS | Status: AC
  Administered 2024-10-09: 142 mg via SUBCUTANEOUS
  Filled 2024-10-09: qty 1

## 2024-10-09 NOTE — Research (Signed)
 Pre-Event Day 1 Visit  Date of Visit: 09-Oct-2024                   Subject Number:[id number]  During this visit the following activities were completed:  [x]   Enrollment/randomization      [x]  Review inclusion and exclusion  [x]  Medical History      [x]  Vital signs        Arm: left       BP: 145/74       HR:79       Resp: 18        Temp: 98.2       O2 Sat:97%  [x]  Physical Exam by Mercy Hails, PA  [x]  EKG  [x]  Adverse events  [x]  Serious adverse device effects  [x] Vital Status/ non-fatal PEPs  [x]  Concomitant therapies reviewed  [x]  Central Labs       []  Urine Preg       [x]  Fasting glucose       [x]  HbA1c       [x] Fasting lipid panel & apolipoproteins       [x]  Lp(a)        [x] Coag       [x]  Chemistry       [x]  UACR       [x]  Hematology       [x]  Anti-olpasiran antibody   [x] Biomarker assessment    [x]  Biomarker development sample  [x] Pharmacokinetic assessments     [x]  Olpasiran serum PK (obtain 1-72 hors after IP given)  [x] Genetic assessment      [x]  Genetic research (optional)   [x]  Olpasiran or placebo        [x]  Monitor at least 30 mins   Mr. Magowan is here for Day 1 of pre-event. He states no abd pain, no visits to the ed or urgent care, and no changes in his meds. Angie Duke, PA did exam and reviewed EKG. VS taken at 0929. Blood drawn at 0951. Urine obtained at 0958. Scheduled next appointment for March 5 at 0930. Injection given in left arm at 1000 tol well. Kit number [id number] rand number [id number]. Post dose blood draw at 1100.   Current Medications[1]     [1]  Current Outpatient Medications:    amLODipine -benazepril  (LOTREL ) 10-40 MG capsule, Take 1 capsule by mouth daily., Disp: 90 capsule, Rfl: 1   amphetamine-dextroamphetamine (ADDERALL XR) 10 MG 24 hr capsule, Take 10 mg by mouth every morning. Not taken regularly-makes him feel nervous and jittery., Disp: , Rfl:    aspirin  EC 81 MG tablet, Take 1 tablet (81 mg total) by mouth daily.  Swallow whole., Disp: 90 tablet, Rfl: 3   dorzolamide-timolol (COSOPT) 2-0.5 % ophthalmic solution, Place 1 drop into both eyes 2 (two) times daily., Disp: , Rfl:    Evolocumab  (REPATHA  SURECLICK) 140 MG/ML SOAJ, INJECT 140 MG INTO THE SKIN EVERY 14 (FOURTEEN) DAYS., Disp: 6 mL, Rfl: 1   finasteride  (PROPECIA ) 1 MG tablet, Take 1 tablet (1 mg total) by mouth daily., Disp: 90 tablet, Rfl: 3   Multiple Vitamins-Iron (MULTIVITAMIN/IRON) TABS, Take 1 tablet by mouth daily., Disp: , Rfl:    NASONEX 50 MCG/ACT nasal spray, PLACE 2 SQUIRTS IN EACH NOSTRIL DAILY, Disp: 17 g, Rfl: 11   rosuvastatin  (CRESTOR ) 40 MG tablet, Take 1 tablet (40 mg total) by mouth daily., Disp: 90 tablet, Rfl: 3   semaglutide -weight management (WEGOVY ) 2.4 MG/0.75ML SOAJ SQ injection, Inject 2.4 mg into the skin once a week., Disp: 3  mL, Rfl: 11   sulfaSALAzine  (AZULFIDINE ) 500 MG tablet, Take 3 tablets (1,500 mg total) by mouth 2 (two) times daily., Disp: 540 tablet, Rfl: 3   traZODone  (DESYREL ) 50 MG tablet, Take 0.5-1 tablets (25-50 mg total) by mouth at bedtime as needed for sleep., Disp: 90 tablet, Rfl: 2   vortioxetine  HBr (TRINTELLIX ) 20 MG TABS tablet, Take 1 tablet (20 mg total) by mouth daily., Disp: 90 tablet, Rfl: 3  Current Facility-Administered Medications:    Study - [facility]CEAN(A)-PRE-EVENT - olpasiran 142 mg/mL or placebo SQ injection (PI-Hilty), 142 mg, Subcutaneous, Q90 days, , 142 mg at 10/09/24 1000

## 2024-10-09 NOTE — Progress Notes (Signed)
" ° ° °  Research Note  Date:  10/09/2024  ID:  Osamah, Schmader [birth year], MRN [id number]   Chief Complaint and HPI  Richard [name] is a 69 y.o. male presents for Day 1 for Pre-Event trial.  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 He can complete greater than 4.0 METS.   Today patient denies chest pain, shortness of breath, lower extremity edema, palpitations, syncope, orthopnea, and PND.    Studies Reviewed   EKG:  NSR with HR 82, first degree heart block   Physical Exam  VS:  BP (!) 145/74 (BP Location: Left Arm, Patient Position: Sitting, Cuff Size: Normal)   Pulse 79   Temp 98.2 F (36.8 C)   Resp 18   Wt 253 lb (114.8 kg)   SpO2 97%   BMI 33.38 kg/m    Wt Readings from Last 3 Encounters:  10/09/24 253 lb (114.8 kg)  09/09/24 255 lb (115.7 kg)  06/24/24 259 lb (117.5 kg)     General: Well developed, well nourished, appearing in no acute distress. Head: Normocephalic, atraumatic.  Neck: Supple without bruits, JVD. Lungs:  Resp regular and unlabored, CTA. Heart: RRR, S1, S2, no S3, S4, or murmur; no rub. Abdomen: Soft, non-tender, non-distended with normoactive bowel sounds. No hepatomegaly. No rebound/guarding. No obvious abdominal masses. Extremities: No clubbing, cyanosis, edema. Distal pedal pulses are 2+ bilaterally. Neuro: Alert and oriented X 3. Moves all extremities spontaneously. Psych: Normal affect.   Assessment and Plan   The patient was given the opportunity to ask any further questions about the study that were not addressed by the research nurse coordinators - he has no further questions and wants to proceed at this time.  Signed, Jon Nat Hails, PhD, PA-C 10/09/2024, 10:11 AM [phone] [facility] [address] [place], [place] [address]   "

## 2024-11-06 ENCOUNTER — Encounter

## 2025-01-16 ENCOUNTER — Encounter: Admitting: Family Medicine

## 2025-05-27 ENCOUNTER — Ambulatory Visit: Admitting: Urology
